# Patient Record
Sex: Female | Born: 1940
Health system: Southern US, Community
[De-identification: ages and names within clinical notes are randomized; demographics above are authoritative.]

## PROBLEM LIST (undated history)

## (undated) DIAGNOSIS — E041 Nontoxic single thyroid nodule: Secondary | ICD-10-CM

## (undated) DIAGNOSIS — E785 Hyperlipidemia, unspecified: Secondary | ICD-10-CM

## (undated) DIAGNOSIS — G5603 Carpal tunnel syndrome, bilateral upper limbs: Secondary | ICD-10-CM

## (undated) DIAGNOSIS — J449 Chronic obstructive pulmonary disease, unspecified: Secondary | ICD-10-CM

## (undated) DIAGNOSIS — Z720 Tobacco use: Secondary | ICD-10-CM

## (undated) DIAGNOSIS — D4959 Neoplasm of unspecified behavior of other genitourinary organ: Secondary | ICD-10-CM

## (undated) DIAGNOSIS — I10 Essential (primary) hypertension: Secondary | ICD-10-CM

## (undated) HISTORY — DX: Hyperlipidemia, unspecified: E78.5

## (undated) HISTORY — DX: Nontoxic single thyroid nodule: E04.1

## (undated) HISTORY — DX: Neoplasm of unspecified behavior of other genitourinary organ: D49.59

## (undated) HISTORY — DX: Essential (primary) hypertension: I10

## (undated) HISTORY — DX: Tobacco use: Z72.0

## (undated) HISTORY — PX: TONSILLECTOMY: SHX5217

## (undated) HISTORY — DX: Chronic obstructive pulmonary disease, unspecified: J44.9

---

## 2005-01-27 ENCOUNTER — Other Ambulatory Visit: Admission: RE | Admit: 2005-01-27 | Discharge: 2005-01-27 | Payer: Self-pay | Admitting: Family Medicine

## 2006-01-27 ENCOUNTER — Other Ambulatory Visit: Admission: RE | Admit: 2006-01-27 | Discharge: 2006-01-27 | Payer: Self-pay | Admitting: Family Medicine

## 2006-07-06 ENCOUNTER — Ambulatory Visit: Payer: Self-pay | Admitting: Family Medicine

## 2006-12-16 ENCOUNTER — Ambulatory Visit: Payer: Self-pay | Admitting: Family Medicine

## 2007-03-10 ENCOUNTER — Ambulatory Visit: Payer: Self-pay | Admitting: Family Medicine

## 2008-03-14 ENCOUNTER — Ambulatory Visit: Payer: Self-pay | Admitting: Family Medicine

## 2008-03-14 ENCOUNTER — Other Ambulatory Visit: Admission: RE | Admit: 2008-03-14 | Discharge: 2008-03-14 | Payer: Self-pay | Admitting: Family Medicine

## 2008-03-14 LAB — HM PAP SMEAR: HM Pap smear: NORMAL

## 2008-03-15 ENCOUNTER — Encounter: Admission: RE | Admit: 2008-03-15 | Discharge: 2008-03-15 | Payer: Self-pay | Admitting: Family Medicine

## 2009-03-18 ENCOUNTER — Ambulatory Visit: Payer: Self-pay | Admitting: Family Medicine

## 2009-03-19 ENCOUNTER — Encounter: Admission: RE | Admit: 2009-03-19 | Discharge: 2009-03-19 | Payer: Self-pay | Admitting: Family Medicine

## 2009-10-15 ENCOUNTER — Encounter: Admission: RE | Admit: 2009-10-15 | Discharge: 2009-10-15 | Payer: Self-pay | Admitting: Family Medicine

## 2009-10-15 ENCOUNTER — Ambulatory Visit: Payer: Self-pay | Admitting: Family Medicine

## 2009-10-18 ENCOUNTER — Encounter: Payer: Self-pay | Admitting: Pulmonary Disease

## 2009-10-18 ENCOUNTER — Ambulatory Visit: Payer: Self-pay | Admitting: Family Medicine

## 2009-10-30 ENCOUNTER — Ambulatory Visit: Payer: Self-pay | Admitting: Pulmonary Disease

## 2009-10-30 DIAGNOSIS — D751 Secondary polycythemia: Secondary | ICD-10-CM | POA: Insufficient documentation

## 2009-10-30 DIAGNOSIS — R0602 Shortness of breath: Secondary | ICD-10-CM | POA: Insufficient documentation

## 2009-10-30 DIAGNOSIS — F17200 Nicotine dependence, unspecified, uncomplicated: Secondary | ICD-10-CM | POA: Insufficient documentation

## 2009-10-31 ENCOUNTER — Encounter: Payer: Self-pay | Admitting: Pulmonary Disease

## 2009-11-04 ENCOUNTER — Ambulatory Visit: Payer: Self-pay | Admitting: Pulmonary Disease

## 2009-11-04 DIAGNOSIS — J449 Chronic obstructive pulmonary disease, unspecified: Secondary | ICD-10-CM

## 2009-11-04 HISTORY — DX: Chronic obstructive pulmonary disease, unspecified: J44.9

## 2009-11-07 ENCOUNTER — Encounter: Payer: Self-pay | Admitting: Pulmonary Disease

## 2009-11-15 ENCOUNTER — Ambulatory Visit: Payer: Self-pay | Admitting: Pulmonary Disease

## 2009-11-15 DIAGNOSIS — R141 Gas pain: Secondary | ICD-10-CM | POA: Insufficient documentation

## 2009-11-15 DIAGNOSIS — J449 Chronic obstructive pulmonary disease, unspecified: Secondary | ICD-10-CM | POA: Insufficient documentation

## 2009-11-15 DIAGNOSIS — R142 Eructation: Secondary | ICD-10-CM

## 2009-11-15 DIAGNOSIS — R143 Flatulence: Secondary | ICD-10-CM

## 2010-02-17 ENCOUNTER — Ambulatory Visit: Payer: Self-pay | Admitting: Physician Assistant

## 2010-02-19 ENCOUNTER — Encounter: Admission: RE | Admit: 2010-02-19 | Discharge: 2010-02-19 | Payer: Self-pay | Admitting: Family Medicine

## 2010-03-11 LAB — HM MAMMOGRAPHY: HM Mammogram: NORMAL

## 2010-03-19 ENCOUNTER — Ambulatory Visit: Payer: Self-pay | Admitting: Family Medicine

## 2010-07-28 ENCOUNTER — Ambulatory Visit: Payer: Self-pay | Admitting: Physician Assistant

## 2010-08-05 ENCOUNTER — Ambulatory Visit: Payer: Self-pay | Admitting: Family Medicine

## 2010-08-12 ENCOUNTER — Ambulatory Visit: Payer: Self-pay | Admitting: Family Medicine

## 2010-10-13 ENCOUNTER — Ambulatory Visit
Admission: RE | Admit: 2010-10-13 | Discharge: 2010-10-13 | Payer: Self-pay | Source: Home / Self Care | Attending: Family Medicine | Admitting: Family Medicine

## 2010-11-04 NOTE — Miscellaneous (Signed)
Summary: Pulmonary function test   Pulmonary Function Test Date: 11/04/2009 Height (in.): 65 Gender: Female  Pre-Spirometry FVC    Value: 2.68 L/min   Pred: 2.97 L/min     % Pred: 90 % FEV1    Value: 1.19 L     Pred: 2.13 L     % Pred: 56 % FEV1/FVC  Value: 44 %     Pred: 72 %     % Pred: . % FEF 25-75  Value: 0.39 L/min   Pred: 2.38 L/min     % Pred: 16 %  Post-Spirometry FVC    Value: 2.61 L/min   Pred: 2.97 L/min     % Pred: 88 % FEV1    Value: 1.11 L     Pred: 2.13 L     % Pred: 52 % FEV1/FVC  Value: 43 %     Pred: 72 %     % Pred: . % FEF 25-75  Value: 0.35 L/min   Pred: 2.38 L/min     % Pred: 15 %  Lung Volumes TLC    Value: 5.80 L   % Pred: 115 % RV    Value: 3.12 L   % Pred: 155 % DLCO    Value: 9.2 %   % Pred: 52 % DLCO/VA  Value: 2.41 %   % Pred: 68 %  Comments: Severe obstruction.  Airtrapping.  Moderate diffusion defect.  No bronchodilator response. Clinical Lists Changes  Observations: Added new observation of PFT COMMENTS: Severe obstruction.  Airtrapping.  Moderate diffusion defect.  No bronchodilator response. (11/04/2009 8:21) Added new observation of DLCO/VA%EXP: 68 % (11/04/2009 8:21) Added new observation of DLCO/VA: 2.41 % (11/04/2009 8:21) Added new observation of DLCO % EXPEC: 52 % (11/04/2009 8:21) Added new observation of DLCO: 9.2 % (11/04/2009 8:21) Added new observation of RV % EXPECT: 155 % (11/04/2009 8:21) Added new observation of RV: 3.12 L (11/04/2009 8:21) Added new observation of TLC % EXPECT: 115 % (11/04/2009 8:21) Added new observation of TLC: 5.80 L (11/04/2009 8:21) Added new observation of FEF2575%EXPS: 15 % (11/04/2009 8:21) Added new observation of PSTFEF25/75P: 2.38  (11/04/2009 8:21) Added new observation of PSTFEF25/75%: 0.35 L/min (11/04/2009 8:21) Added new observation of PSTFEV1/FCV%: . % (11/04/2009 8:21) Added new observation of FEV1FVCPRDPS: 72 % (11/04/2009 8:21) Added new observation of PSTFEV1/FVC: 43 % (11/04/2009  8:21) Added new observation of POSTFEV1%PRD: 52 % (11/04/2009 8:21) Added new observation of FEV1PRDPST: 2.13 L (11/04/2009 8:21) Added new observation of POST FEV1: 1.11 L/min (11/04/2009 8:21) Added new observation of POST FVC%EXP: 88 % (11/04/2009 8:21) Added new observation of FVCPRDPST: 2.97 L/min (11/04/2009 8:21) Added new observation of POST FVC: 2.61 L (11/04/2009 8:21) Added new observation of FEF % EXPEC: 16 % (11/04/2009 8:21) Added new observation of FEF25-75%PRE: 2.38 L/min (11/04/2009 8:21) Added new observation of FEF 25-75%: 0.39 L/min (11/04/2009 8:21) Added new observation of FEV1/FVC%EXP: . % (11/04/2009 8:21) Added new observation of FEV1/FVC PRE: 72 % (11/04/2009 8:21) Added new observation of FEV1/FVC: 44 % (11/04/2009 8:21) Added new observation of FEV1 % EXP: 56 % (11/04/2009 8:21) Added new observation of FEV1 PREDICT: 2.13 L (11/04/2009 8:21) Added new observation of FEV1: 1.19 L (11/04/2009 8:21) Added new observation of FVC % EXPECT: 90 % (11/04/2009 8:21) Added new observation of FVC PREDICT: 2.97 L (11/04/2009 8:21) Added new observation of FVC: 2.68 L (11/04/2009 8:21) Added new observation of PFT HEIGHT: 65  (11/04/2009 8:21) Added new observation of PFT DATE: 11/04/2009  (  11/04/2009 8:21) 

## 2010-11-04 NOTE — Assessment & Plan Note (Signed)
Summary: 2-3 weeks/apc   Primary Provider/Referring Provider:  Dr. Sharlot Gowda  CC:  Dyspnea. Discuss ONO results and PFT's..  History of Present Illness: 70 yo female with COPD.  She has no change in her symptoms.  Her PFT showed moderate to severe COPD with emphysema.  Her ONO showed only brief, minimal oxygen desaturation.  This was not enough to warrant supplemental oxygen.  Current Medications (verified): 1)  Aspirin 81 Mg Tabs (Aspirin) .Marland Kitchen.. 1 By Mouth Daily 2)  Lisinopril-Hydrochlorothiazide 20-25 Mg Tabs (Lisinopril-Hydrochlorothiazide) .Marland Kitchen.. 1 By Mouth Daily 3)  Simvastatin 20 Mg Tabs (Simvastatin) .Marland Kitchen.. 1 By Mouth Daily 4)  Ester-C 500-60 Mg Tabs (Vitamin Mixture) .Marland Kitchen.. 1 By Mouth Daily 5)  Omega-3 1000 Mg Caps (Omega-3 Fatty Acids) .Marland Kitchen.. 1 By Mouth Two Times A Day 6)  Odorless Garlic 500 Mg Tabs (Garlic) .Marland Kitchen.. 1 By Mouth Daily 7)  Multivitamins  Tabs (Multiple Vitamin) .Marland Kitchen.. 1 By Mouth Daily 8)  Coq10 200 Mg Caps (Coenzyme Q10) .Marland Kitchen.. 1 By Mouth Daily 9)  B-100  Tabs (Vitamins-Lipotropics) .Marland Kitchen.. 1 By Mouth Daily 10)  Green Tea 315 Mg Caps (Green Tea (Camillia Sinensis)) .Marland Kitchen.. 1 By Mouth Daily 11)  Calcium 600-200 Mg-Unit Tabs (Calcium-Vitamin D) .... 2 By Mouth Daily 12)  Calcium/mag/zinc 1000/400/25 .Marland Kitchen.. 1 By Mouth Daily 13)  Proair Hfa 108 (90 Base) Mcg/act Aers (Albuterol Sulfate) .... Two Puffs Up To Four Times Per Day As Needed  Allergies (verified): 1)  ! Niacin 2)  ! Codeine  Past History:  Past Medical History: Thyroid nodule Hypertension Hyperlipidemia Tobacco abuse GOLD 3 COPD      - 11/04/09 PFT FEV1 1.19(56%), FVC 2.68(90%), FEV1% 44, TLC 5.80(115%), DLCO 52%  Past Surgical History: Reviewed history from 10/30/2009 and no changes required. Tonsillectomy  Vital Signs:  Patient profile:   70 year old female Height:      65.5 inches (166.37 cm) Weight:      131 pounds (59.55 kg) BMI:     21.55 O2 Sat:      95 % on Room air Temp:     97.6 degrees F  (36.44 degrees C) oral Pulse rate:   70 / minute BP sitting:   132 / 72  (left arm) Cuff size:   regular  Vitals Entered By: Michel Bickers CMA (November 15, 2009 1:50 PM)  O2 Sat at Rest %:  95 O2 Flow:  Room air  Physical Exam  Nose:  no deformity, discharge, inflammation, or lesions Mouth:  wears dentures, no oral lesions Neck:  no JVD.   Lungs:  diminished breath sounds, no wheezing or rales Heart:  regular rate and rhythm, S1, S2 without murmurs, rubs, gallops, or clicks Extremities:  no clubbing, cyanosis, edema, or deformity noted Cervical Nodes:  no significant adenopathy   Impression & Recommendations:  Problem # 1:  COPD (ICD-496) Her PFT shows moderate to severe COPD with emphysema.  I will start her on spiriva, and continue as needed proair.  Will refer her to pulmonary rehab. If she can stop smoking would then check A1AT level.  She will check to she if/when she got her pneumonia shot.  Problem # 2:  TOBACCO ABUSE (ICD-305.1) She is not ready to stop smoking yet.  Again explained how smoking is impacting her health.  Problem # 3:  POLYCYTHEMIA (ICD-289.0) Her recent overnight oximetry was unremarkable.  Problem # 4:  ABDOMINAL BLOATING (ICD-787.3) Advised for her to follow up with primary care.  Medications Added to Medication List  This Visit: 1)  Spiriva Handihaler 18 Mcg Caps (Tiotropium bromide monohydrate) .... One puff once daily  Complete Medication List: 1)  Aspirin 81 Mg Tabs (Aspirin) .Marland Kitchen.. 1 by mouth daily 2)  Lisinopril-hydrochlorothiazide 20-25 Mg Tabs (Lisinopril-hydrochlorothiazide) .Marland Kitchen.. 1 by mouth daily 3)  Simvastatin 20 Mg Tabs (Simvastatin) .Marland Kitchen.. 1 by mouth daily 4)  Ester-c 500-60 Mg Tabs (Vitamin mixture) .Marland Kitchen.. 1 by mouth daily 5)  Omega-3 1000 Mg Caps (Omega-3 fatty acids) .Marland Kitchen.. 1 by mouth two times a day 6)  Odorless Garlic 500 Mg Tabs (Garlic) .Marland Kitchen.. 1 by mouth daily 7)  Multivitamins Tabs (Multiple vitamin) .Marland Kitchen.. 1 by mouth daily 8)  Coq10 200  Mg Caps (Coenzyme q10) .Marland Kitchen.. 1 by mouth daily 9)  B-100 Tabs (Vitamins-lipotropics) .Marland Kitchen.. 1 by mouth daily 10)  Green Tea 315 Mg Caps (Green tea (camillia sinensis)) .Marland Kitchen.. 1 by mouth daily 11)  Calcium 600-200 Mg-unit Tabs (Calcium-vitamin d) .... 2 by mouth daily 12)  Calcium/mag/zinc 1000/400/25  .Marland Kitchen.. 1 by mouth daily 13)  Proair Hfa 108 (90 Base) Mcg/act Aers (Albuterol sulfate) .... Two puffs up to four times per day as needed 14)  Spiriva Handihaler 18 Mcg Caps (Tiotropium bromide monohydrate) .... One puff once daily  Other Orders: Est. Patient Level III (04540) Rehabilitation Referral (Rehab)  Patient Instructions: 1)  Spiriva one puff once daily  2)  Proair two puffs up to four times per day as needed for cough, wheeze, congestion, or shortness of breath 3)  Will refer to pulmonary rehab 4)  Follow up in 2 to 3 months Prescriptions: PROAIR HFA 108 (90 BASE) MCG/ACT AERS (ALBUTEROL SULFATE) two puffs up to four times per day as needed  #1 x 6   Entered and Authorized by:   Coralyn Helling MD   Signed by:   Coralyn Helling MD on 11/15/2009   Method used:   Electronically to        [facility] # [id number]* (retail)       [address] [place], [place]  [address]       Ph: [phone]       Fax: [phone]   RxID:   [id number] SPIRIVA HANDIHALER 18 MCG CAPS (TIOTROPIUM BROMIDE MONOHYDRATE) one puff once daily  #30 x 6   Entered and Authorized by:   Coralyn Helling MD   Signed by:   Coralyn Helling MD on 11/15/2009   Method used:   Electronically to        [facility] # [phone]* (retail)       [address] [place], [place]  [address]       Ph: [phone]       Fax: [phone]   RxID:   [id number]    Immunization History:  Influenza Immunization History:    Influenza:  historical (06/19/2009)

## 2010-11-04 NOTE — Miscellaneous (Signed)
Summary: Overnight oximetry  Clinical Lists Changes Test time 6hrs 4 min.  Average SpO2 93%.  Low SpO2 87%.  Spent 20 sec with SpO2 < 88%.  Will have my nurse call to inform patient that overnight oxygen test looked okay, and will discuss further at next ROV.  Appended Document: Overnight oximetry LMTCB.  Appended Document: Overnight oximetry The patient is aware ONO looks good and Dr. Craige Cotta will discuss further at her OV on 11/15/09 @ 1:45pm.

## 2010-11-04 NOTE — Assessment & Plan Note (Signed)
Summary: wheezing, r/o copd/apc   Primary Provider/Referring Provider:  Dr. Sharlot Gowda  CC:  Pulmonary consult for dyspnea and wheezing.Marland Kitchen  History of Present Illness: 70 yo female for evaluation of dyspnea.  She had an episode 2 weeks ago in which should could not catch her breath.  She was making her bed, and then suddenly felt like she couldn't get air into her lungs.  She had some mild wheeze at that time.  She was given an inhaler, but has not used it.  She has never had anything like this happen before.  Otherwise she has not felt like her breathing has limited her activity level in anyway.  In fact she has a regular exercise routine, and has been able to do this without difficulty.  She currently denies cough, wheeze, sputum, hemoptysis, chest pain, palpitations, lymph node swelling, leg swelling, skin rashes, or joint swelling.  There is no history of allergies, asthma, pneumonia, TB, or thrombo-embolic disease.  She has not had fever, sweats, or weight loss.  She did have a gassy feeling when she had trouble with her breathing, and took milk of magnesia.  She is originally from [place], [place].  She has lived in [place] for the past 13 years.  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  There is no recent travel history, animal exposures, or sick exposures.  She continues to smoke cigarettes.  She states that she likes smoking, and is no interested in quitting.  She had recent lab work which showed an elevated hemoglobin.  She had a recent chest xray which showed hyperinflation, and bronchitic changes.   CXR  Procedure date:  10/15/2009  Findings:       Findings: Slight hyperinflation with generalized prominence   bronchopulmonary markings of slight diffuse bronchitis noted.   Lungs are otherwise clear without pneumonia.  Heart size is normal.   Thoracic aortic arch is calcified.  Mediastinum, hila, pleura and   osseous structures are  unremarkable.    IMPRESSION:    1.  Slight hyperinflation of COPD with slight diffuse bronchitis   without pneumonia.   2.  Otherwise, negative.   Preventive Screening-Counseling & Management  Alcohol-Tobacco     Alcohol drinks/day: <1     Smoking Status: current     Packs/Day: 0.5     Year Started: 48 years  Current Medications (verified): 1)  Aspirin 81 Mg Tabs (Aspirin) .Marland Kitchen.. 1 By Mouth Daily 2)  Lisinopril-Hydrochlorothiazide 20-25 Mg Tabs (Lisinopril-Hydrochlorothiazide) .Marland Kitchen.. 1 By Mouth Daily 3)  Simvastatin 20 Mg Tabs (Simvastatin) .Marland Kitchen.. 1 By Mouth Daily 4)  Ester-C 500-60 Mg Tabs (Vitamin Mixture) .Marland Kitchen.. 1 By Mouth Daily 5)  Omega-3 1000 Mg Caps (Omega-3 Fatty Acids) .Marland Kitchen.. 1 By Mouth Two Times A Day 6)  Odorless Garlic 500 Mg Tabs (Garlic) .Marland Kitchen.. 1 By Mouth Daily 7)  Multivitamins  Tabs (Multiple Vitamin) .Marland Kitchen.. 1 By Mouth Daily 8)  Coq10 200 Mg Caps (Coenzyme Q10) .Marland Kitchen.. 1 By Mouth Daily 9)  B-100  Tabs (Vitamins-Lipotropics) .Marland Kitchen.. 1 By Mouth Daily 10)  Green Tea 315 Mg Caps (Green Tea (Camillia Sinensis)) .Marland Kitchen.. 1 By Mouth Daily 11)  Calcium 600-200 Mg-Unit Tabs (Calcium-Vitamin D) .... 2 By Mouth Daily 12)  Calcium/mag/zinc 1000/400/25 .Marland Kitchen.. 1 By Mouth Daily  Allergies (verified): 1)  ! Niacin 2)  ! Codeine  Past History:  Past Medical History: Thyroid nodule Hypertension Hyperlipidemia Tobacco abuse  Past Surgical History: Tonsillectomy  Family History: Heart disease---mother Family History  Asthma---father Family History Emphysema ---father  Social History: Patient is a current smoker.  Married Retired Engineer, building services Status:  current Packs/Day:  0.5 Alcohol drinks/day:  <1  Review of Systems       The patient complains of shortness of breath with activity, non-productive cough, acid heartburn, and loss of appetite.  The patient denies shortness of breath at rest, productive cough, coughing up blood, chest pain, irregular heartbeats, indigestion, weight  change, abdominal pain, difficulty swallowing, sore throat, tooth/dental problems, headaches, nasal congestion/difficulty breathing through nose, sneezing, itching, ear ache, anxiety, depression, hand/feet swelling, joint stiffness or pain, rash, change in color of mucus, and fever.    Vital Signs:  Patient profile:   70 year old female Height:      65.5 inches (166.37 cm) Weight:      128.38 pounds (58.35 kg) BMI:     21.11 O2 Sat:      92 % on Room air Temp:     98.1 degrees F (36.72 degrees C) oral Pulse rate:   81 / minute BP sitting:   124 / 76  (right arm) Cuff size:   regular  Vitals Entered By: Michel Bickers CMA (October 30, 2009 2:48 PM)  O2 Sat at Rest %:  92 O2 Flow:  Room air  Serial Vital Signs/Assessments:  Comments: 3:13 PM Ambulatory Pulse Oximetry  Resting; HR__91___    02 Sat_97____  Lap1 (185 feet)   HR_87____   02 Sat__97___ Lap2 (185 feet)   HR__87___   02 Sat__95___    Lap3 (185 feet)   HR___90__   02 Sat__95___  x___Test Completed without Difficulty ___Test Stopped due to:  By: Michel Bickers CMA   CC: Pulmonary consult for dyspnea and wheezing.   Physical Exam  General:  thin.   Eyes:  PERRLA and EOMI, wears glasses Nose:  no deformity, discharge, inflammation, or lesions Mouth:  wears dentures, no oral lesions Neck:  no JVD.   Chest Wall:  barrel chest.   Lungs:  diminished breath sounds, no wheezing or rales Heart:  regular rate and rhythm, S1, S2 without murmurs, rubs, gallops, or clicks Abdomen:  bowel sounds positive; abdomen soft and non-tender without masses, or organomegaly Msk:  no deformity or scoliosis noted with normal posture Pulses:  pulses normal Extremities:  no clubbing, cyanosis, edema, or deformity noted Neurologic:  CN II-XII grossly intact with normal reflexes, coordination, muscle strength and tone Cervical Nodes:  no significant adenopathy Psych:  alert and cooperative; normal mood and affect; normal attention span and  concentration   Pulmonary Function Test Date: 10/30/2009 3:04 PM Gender: Female  Pre-Spirometry FVC    Value: 1.38 L/min   % Pred: 43.70 % FEV1    Value: 0.77 L     Pred: 2.39 L     % Pred: 32.20 % FEV1/FVC  Value: 55.81 %     % Pred: 73.30 %  Impression & Recommendations:  Problem # 1:  DYSPNEA (ICD-786.05) She has an extensive history of smoking.  Her radiographic findings are suggestive of emphysema.  Her spirometric findings are suggestive of severe obstruction, but she had some difficulty performing the test.  I have advised her to try using her proair as needed.  I will arrange for full pulmonary function testing to further assess whether she has COPD.  Problem # 2:  POLYCYTHEMIA (ICD-289.0) She had an elevated hemoglobin.  Her oxygen level at rest and exertion was okay.  I will arrange for overnight oximetry to determine if  she is having nocturnal oxygen desaturation.  Problem # 3:  TOBACCO ABUSE (ICD-305.1) Advised her about the importance of smoking cessation.  She states that she likes smoking too much, and is not ready to consider quiting yet.  Medications Added to Medication List This Visit: 1)  Simvastatin 20 Mg Tabs (Simvastatin) .Marland Kitchen.. 1 by mouth daily 2)  Proair Hfa 108 (90 Base) Mcg/act Aers (Albuterol sulfate) .... Two puffs up to four times per day as needed  Complete Medication List: 1)  Aspirin 81 Mg Tabs (Aspirin) .Marland Kitchen.. 1 by mouth daily 2)  Lisinopril-hydrochlorothiazide 20-25 Mg Tabs (Lisinopril-hydrochlorothiazide) .Marland Kitchen.. 1 by mouth daily 3)  Simvastatin 20 Mg Tabs (Simvastatin) .Marland Kitchen.. 1 by mouth daily 4)  Ester-c 500-60 Mg Tabs (Vitamin mixture) .Marland Kitchen.. 1 by mouth daily 5)  Omega-3 1000 Mg Caps (Omega-3 fatty acids) .Marland Kitchen.. 1 by mouth two times a day 6)  Odorless Garlic 500 Mg Tabs (Garlic) .Marland Kitchen.. 1 by mouth daily 7)  Multivitamins Tabs (Multiple vitamin) .Marland Kitchen.. 1 by mouth daily 8)  Coq10 200 Mg Caps (Coenzyme q10) .Marland Kitchen.. 1 by mouth daily 9)  B-100 Tabs  (Vitamins-lipotropics) .Marland Kitchen.. 1 by mouth daily 10)  Green Tea 315 Mg Caps (Green tea (camillia sinensis)) .Marland Kitchen.. 1 by mouth daily 11)  Calcium 600-200 Mg-unit Tabs (Calcium-vitamin d) .... 2 by mouth daily 12)  Calcium/mag/zinc 1000/400/25  .Marland Kitchen.. 1 by mouth daily 13)  Proair Hfa 108 (90 Base) Mcg/act Aers (Albuterol sulfate) .... Two puffs up to four times per day as needed  Other Orders: Consultation Level IV (16109) Spirometry w/Graph (94010) Tobacco use cessation intermediate 3-10 minutes (60454) DME Referral (DME) Full Pulmonary Function Test (PFT)  Patient Instructions: 1)  Will arrange for breathing test (PFT) 2)  Will arrange for overnight oxygen test 3)  Follow up in 2 to 3 weeks   CardioPerfect Spirometry  ID: [id number] Patient: Christine Keller, Christine Keller DOB: 01-20-1941 Age: 70 Years Old Sex: Female Race: White Height: 65.5 Weight: 128.38 PPD: 0.5 Status: Confirmed Recorded: 10/30/2009 3:04 PM  Parameter  Measured Predicted %Predicted FVC     1.38        3.15        43.70 FEV1     0.77        2.39        32.20 FEV1%   55.81        76.15        73.30 PEF    1.98        5.87        33.70   Interpretation: Pre: FVC= 1.38L FEV1= 0.77L FEV1%= 55.8% 0.77/1.38 FEV1/FVC (10/30/2009 3:07:56 PM), Very severe obstruction.

## 2010-11-04 NOTE — Procedures (Signed)
Summary: [facility]   Imported By: [facility] 11/15/2009 11:01:26  _____________________________________________________________________  External Attachment:    Type:   Image     Comment:   External Document

## 2010-11-04 NOTE — Miscellaneous (Signed)
Summary: Orders Update pft charges  Clinical Lists Changes  Orders: Added new Service order of Carbon Monoxide diffusing w/capacity (94720) - Signed Added new Service order of Lung Volumes (94240) - Signed Added new Service order of Spirometry (Pre & Post) (94060) - Signed 

## 2010-11-07 ENCOUNTER — Ambulatory Visit (INDEPENDENT_AMBULATORY_CARE_PROVIDER_SITE_OTHER): Payer: MEDICARE | Admitting: Family Medicine

## 2010-11-07 DIAGNOSIS — H6690 Otitis media, unspecified, unspecified ear: Secondary | ICD-10-CM

## 2010-11-07 DIAGNOSIS — J209 Acute bronchitis, unspecified: Secondary | ICD-10-CM

## 2010-12-15 ENCOUNTER — Encounter: Payer: Self-pay | Admitting: Internal Medicine

## 2010-12-22 ENCOUNTER — Ambulatory Visit (INDEPENDENT_AMBULATORY_CARE_PROVIDER_SITE_OTHER): Payer: MEDICARE | Admitting: Internal Medicine

## 2010-12-22 DIAGNOSIS — R143 Flatulence: Secondary | ICD-10-CM

## 2010-12-22 DIAGNOSIS — I10 Essential (primary) hypertension: Secondary | ICD-10-CM

## 2010-12-22 DIAGNOSIS — G5600 Carpal tunnel syndrome, unspecified upper limb: Secondary | ICD-10-CM | POA: Insufficient documentation

## 2010-12-22 DIAGNOSIS — R209 Unspecified disturbances of skin sensation: Secondary | ICD-10-CM

## 2010-12-22 DIAGNOSIS — R141 Gas pain: Secondary | ICD-10-CM

## 2011-02-05 ENCOUNTER — Other Ambulatory Visit: Payer: Self-pay | Admitting: Internal Medicine

## 2011-02-06 LAB — BASIC METABOLIC PANEL
BUN: 18 mg/dL (ref 6–23)
CO2: 29 mEq/L (ref 19–32)
Calcium: 9.7 mg/dL (ref 8.4–10.5)
Chloride: 105 mEq/L (ref 96–112)
Creat: 0.89 mg/dL (ref 0.40–1.20)
Glucose, Bld: 103 mg/dL — ABNORMAL HIGH (ref 70–99)
Potassium: 3.7 mEq/L (ref 3.5–5.3)
Sodium: 142 mEq/L (ref 135–145)

## 2011-02-06 LAB — HEPATIC FUNCTION PANEL
ALT: 26 U/L (ref 0–35)
AST: 27 U/L (ref 0–37)
Albumin: 4.5 g/dL (ref 3.5–5.2)
Alkaline Phosphatase: 60 U/L (ref 39–117)
Bilirubin, Direct: 0.1 mg/dL (ref 0.0–0.3)
Indirect Bilirubin: 0.4 mg/dL (ref 0.0–0.9)
Total Bilirubin: 0.5 mg/dL (ref 0.3–1.2)
Total Protein: 6.6 g/dL (ref 6.0–8.3)

## 2011-02-06 LAB — VITAMIN D 25 HYDROXY (VIT D DEFICIENCY, FRACTURES): Vit D, 25-Hydroxy: 42 ng/mL (ref 30–89)

## 2011-02-06 LAB — LIPID PANEL
Cholesterol: 168 mg/dL (ref 0–200)
HDL: 44 mg/dL (ref >39)
LDL Cholesterol: 97 mg/dL (ref 0–99)
Total CHOL/HDL Ratio: 3.8 Ratio
Triglycerides: 136 mg/dL (ref <150)
VLDL: 27 mg/dL (ref 0–40)

## 2011-02-06 LAB — TSH: TSH: 1.295 u[IU]/mL (ref 0.350–4.500)

## 2011-02-10 ENCOUNTER — Encounter: Payer: Self-pay | Admitting: Internal Medicine

## 2011-02-10 ENCOUNTER — Ambulatory Visit (INDEPENDENT_AMBULATORY_CARE_PROVIDER_SITE_OTHER): Payer: MEDICARE | Admitting: Internal Medicine

## 2011-02-10 VITALS — BP 118/72 | HR 71 | Temp 97.7°F | Resp 16 | Ht 65.5 in | Wt 125.0 lb

## 2011-02-10 DIAGNOSIS — I10 Essential (primary) hypertension: Secondary | ICD-10-CM

## 2011-02-10 DIAGNOSIS — Z0000 Encounter for general adult medical examination without abnormal findings: Secondary | ICD-10-CM

## 2011-02-10 MED ORDER — VALSARTAN-HYDROCHLOROTHIAZIDE 160-25 MG PO TABS: 1.0000 | ORAL_TABLET | Freq: Every day | ORAL | Status: DC

## 2011-02-10 MED ORDER — SIMVASTATIN 20 MG PO TABS: 20.0000 mg | ORAL_TABLET | Freq: Every day | ORAL | Status: DC

## 2011-02-11 ENCOUNTER — Ambulatory Visit (INDEPENDENT_AMBULATORY_CARE_PROVIDER_SITE_OTHER)
Admission: RE | Admit: 2011-02-11 | Discharge: 2011-02-11 | Disposition: A | Discharge status: Home / Self Care | Payer: MEDICARE | Source: Ambulatory Visit

## 2011-02-11 DIAGNOSIS — Z13820 Encounter for screening for osteoporosis: Secondary | ICD-10-CM

## 2011-02-11 DIAGNOSIS — Z0000 Encounter for general adult medical examination without abnormal findings: Secondary | ICD-10-CM

## 2011-02-11 LAB — HM DEXA SCAN

## 2011-02-12 ENCOUNTER — Telehealth: Payer: Self-pay | Admitting: *Deleted

## 2011-02-12 DIAGNOSIS — I10 Essential (primary) hypertension: Secondary | ICD-10-CM

## 2011-02-12 NOTE — Telephone Encounter (Signed)
Please change to benicar 20/12.5 #90  One tab po qd  Refill x 1

## 2011-02-12 NOTE — Telephone Encounter (Signed)
Received message from [facility] (Ref # [id number]) wanting to verify change in Simvastatin from 40mg  to 20mg . Advised Chor, that Simvastatin has been 20mg  since 10/2010 and that is the correct dose.  They also wanted [place] to be aware that Diovan HCT 160-25 will be a higher copay for pt (90 day supply will be $125).  Some Tier 2 alternatives are Aldactazide, Benicar HCT and Hyzaar HCT. Would any of these be appropriate or should they fill Rx as written?

## 2011-02-13 ENCOUNTER — Telehealth: Payer: Self-pay | Admitting: Internal Medicine

## 2011-02-13 MED ORDER — OLMESARTAN MEDOXOMIL-HCTZ 20-12.5 MG PO TABS: 1.0000 | ORAL_TABLET | Freq: Every day | ORAL | Status: DC

## 2011-02-13 MED ORDER — VALSARTAN-HYDROCHLOROTHIAZIDE 160-25 MG PO TABS: 1.0000 | ORAL_TABLET | Freq: Every day | ORAL | Status: DC

## 2011-02-13 NOTE — Telephone Encounter (Signed)
Call placed to [facility] spoke with pharmacist Boykin Reaper he was advised that patient had declined the formulary change and will pay out of pocket for the prescribed medication. He has verbalized understanding and has updated patients medication to reflect the change for the Diovan instead of the Benicar.  Call was returned to patient at [phone], no answer. A detailed voice message was left informing patient that Rx has been changed back to Diovan.

## 2011-02-13 NOTE — Telephone Encounter (Signed)
She wants to stay on the diovan hct   She does not want to change medicine.  She is willing to pay the $125 for the diovan.  Please send the script for diovan to [facility].

## 2011-02-13 NOTE — Telephone Encounter (Signed)
Call placed to patient at [phone], no answer. A detailed voice message was left informing patient of medication change. If any questions message was left for patient to call back. Rx for Benicar 20-12.5 sent to pharmacy

## 2011-03-09 ENCOUNTER — Encounter: Payer: Self-pay | Admitting: Internal Medicine

## 2011-03-17 ENCOUNTER — Ambulatory Visit (HOSPITAL_BASED_OUTPATIENT_CLINIC_OR_DEPARTMENT_OTHER)
Admission: RE | Admit: 2011-03-17 | Discharge: 2011-03-17 | Disposition: A | Discharge status: Home / Self Care | Payer: Medicare Other | Source: Ambulatory Visit | Attending: Internal Medicine | Admitting: Internal Medicine

## 2011-03-17 DIAGNOSIS — Z1231 Encounter for screening mammogram for malignant neoplasm of breast: Secondary | ICD-10-CM | POA: Insufficient documentation

## 2011-03-17 LAB — HM MAMMOGRAPHY: HM Mammogram: NORMAL

## 2011-04-15 ENCOUNTER — Ambulatory Visit (INDEPENDENT_AMBULATORY_CARE_PROVIDER_SITE_OTHER): Payer: Medicare Other | Admitting: Family

## 2011-04-15 ENCOUNTER — Encounter: Payer: Self-pay | Admitting: Family

## 2011-04-15 ENCOUNTER — Other Ambulatory Visit (HOSPITAL_COMMUNITY)
Admission: RE | Admit: 2011-04-15 | Discharge: 2011-04-15 | Disposition: A | Discharge status: Home / Self Care | Payer: Medicare Other | Source: Ambulatory Visit | Attending: Family | Admitting: Family

## 2011-04-15 ENCOUNTER — Encounter: Payer: Self-pay | Admitting: *Deleted

## 2011-04-15 ENCOUNTER — Encounter: Payer: MEDICARE | Admitting: Internal Medicine

## 2011-04-15 ENCOUNTER — Ambulatory Visit (HOSPITAL_BASED_OUTPATIENT_CLINIC_OR_DEPARTMENT_OTHER)
Admission: RE | Admit: 2011-04-15 | Discharge: 2011-04-15 | Disposition: A | Discharge status: Home / Self Care | Payer: Medicare Other | Source: Ambulatory Visit | Attending: Family | Admitting: Family

## 2011-04-15 VITALS — BP 146/80 | HR 84 | Temp 97.7°F | Resp 16 | Ht 64.5 in | Wt 126.1 lb

## 2011-04-15 DIAGNOSIS — R209 Unspecified disturbances of skin sensation: Secondary | ICD-10-CM

## 2011-04-15 DIAGNOSIS — R202 Paresthesia of skin: Secondary | ICD-10-CM

## 2011-04-15 DIAGNOSIS — R7309 Other abnormal glucose: Secondary | ICD-10-CM

## 2011-04-15 DIAGNOSIS — M47812 Spondylosis without myelopathy or radiculopathy, cervical region: Secondary | ICD-10-CM | POA: Insufficient documentation

## 2011-04-15 DIAGNOSIS — F17200 Nicotine dependence, unspecified, uncomplicated: Secondary | ICD-10-CM

## 2011-04-15 DIAGNOSIS — M25519 Pain in unspecified shoulder: Secondary | ICD-10-CM | POA: Insufficient documentation

## 2011-04-15 DIAGNOSIS — M5030 Other cervical disc degeneration, unspecified cervical region: Secondary | ICD-10-CM | POA: Insufficient documentation

## 2011-04-15 DIAGNOSIS — Z0000 Encounter for general adult medical examination without abnormal findings: Secondary | ICD-10-CM

## 2011-04-15 DIAGNOSIS — R739 Hyperglycemia, unspecified: Secondary | ICD-10-CM | POA: Insufficient documentation

## 2011-04-15 DIAGNOSIS — M899 Disorder of bone, unspecified: Secondary | ICD-10-CM | POA: Insufficient documentation

## 2011-04-15 DIAGNOSIS — Z01419 Encounter for gynecological examination (general) (routine) without abnormal findings: Secondary | ICD-10-CM

## 2011-04-15 DIAGNOSIS — M79609 Pain in unspecified limb: Secondary | ICD-10-CM | POA: Insufficient documentation

## 2011-04-15 DIAGNOSIS — Z124 Encounter for screening for malignant neoplasm of cervix: Secondary | ICD-10-CM | POA: Insufficient documentation

## 2011-04-15 DIAGNOSIS — M949 Disorder of cartilage, unspecified: Secondary | ICD-10-CM | POA: Insufficient documentation

## 2011-04-15 DIAGNOSIS — I10 Essential (primary) hypertension: Secondary | ICD-10-CM

## 2011-04-15 DIAGNOSIS — M542 Cervicalgia: Secondary | ICD-10-CM | POA: Insufficient documentation

## 2011-04-15 DIAGNOSIS — M5020 Other cervical disc displacement, unspecified cervical region: Secondary | ICD-10-CM | POA: Insufficient documentation

## 2011-04-15 LAB — HEMOGLOBIN A1C
Hgb A1c MFr Bld: 6 % — ABNORMAL HIGH (ref <5.7)
Mean Plasma Glucose: 126 mg/dL — ABNORMAL HIGH (ref <117)

## 2011-04-15 MED ORDER — VALSARTAN-HYDROCHLOROTHIAZIDE 160-25 MG PO TABS: 1.0000 | ORAL_TABLET | Freq: Every day | ORAL | Status: DC

## 2011-04-15 MED ORDER — TIOTROPIUM BROMIDE MONOHYDRATE 18 MCG IN CAPS: 18.0000 ug | ORAL_CAPSULE | Freq: Every day | RESPIRATORY_TRACT | Status: DC

## 2011-04-15 NOTE — Progress Notes (Signed)
Addended by: Mervin Kung A on: 04/15/2011 12:23 PM   Modules accepted: Orders

## 2011-04-15 NOTE — Assessment & Plan Note (Signed)
Fasting glucose ws 103, will check A1C.

## 2011-04-15 NOTE — Assessment & Plan Note (Signed)
BP is up today. She reports "white coat" HTN.  She will continue to check her BP at home and contact us if BP >140/90

## 2011-04-15 NOTE — Patient Instructions (Signed)
Please complete your lab work on the first floor today. Follow up in 3 months so that we can repeat your blood pressure. We will mail you the results of your pap smear.

## 2011-04-15 NOTE — Assessment & Plan Note (Signed)
Rule out cervical disc disease, start with x-ray cspine. If abnormal consider MRI.

## 2011-04-15 NOTE — Assessment & Plan Note (Signed)
Patient commended on her healthy diet and exercise. Immunizations up to date. Refuses colo.  Agreeable to IFOB.  Pap performed today.

## 2011-04-15 NOTE — Assessment & Plan Note (Signed)
Pt was counseled on smoking cessation x 5 minutes.  She is not motivated to quit at this time.

## 2011-04-15 NOTE — Progress Notes (Signed)
Subjective:    Patient ID: Christine Keller, female    DOB: [birth year], 70 y.o.   MRN: [id number]  HPI  Prevenative Care-  Up to date on dexa scan, never has had colo, up to date on mammogram, pneumovax, zostavax and tetanus.  She exercises 3x a week at the fitness center, walks regularly.  Diet is healthy- lots of veggies, some fruit- diet is low fat.    Tobacco abuse- down to <1 PPD.  Not motivated to quit.    Hand paresthesias- no improvement with the braces for CTS without improvement. She notes some pain radiating down her arms as well.  Denies hand weakness.   Review of Systems  Constitutional: Negative for fever.  HENT: Negative for hearing loss and congestion.   Eyes: Negative for visual disturbance.  Respiratory: Negative for choking and chest tightness.   Cardiovascular: Negative for chest pain.  Gastrointestinal: Negative for vomiting, abdominal pain, diarrhea, constipation and blood in stool.  Genitourinary: Negative for dysuria, urgency and hematuria.  Musculoskeletal: Negative for arthralgias.       Bilateral hand numbness- see HPI  Neurological: Negative for headaches.  Hematological: Negative for adenopathy. Does not bruise/bleed easily.  Psychiatric/Behavioral:       Denies depression or anxiety.     Past Medical History  Diagnosis Date  . Hyperlipidemia   . Hypertension   . Thyroid nodule   . Tobacco abuse   . COPD (chronic obstructive pulmonary disease) 1.31.11    PFT FEV1 1.19(56%), FVC 2.68(90%), FEV1% 44, TLC 5.80(115%), DLCO 52%    History   Social History  . Marital Status: Married    Spouse Name: N/A    Number of Children: N/A  . Years of Education: N/A   Occupational History  . Retired     Diplomatic Services operational officer   Social History Main Topics  . Smoking status: Current Everyday Smoker    Types: Cigarettes  . Smokeless tobacco: Not on file   Comment: Less than 1 ppd.  . Alcohol Use: Not on file  . Drug Use: Not on file  . Sexually Active: Not on file    Other Topics Concern  . Not on file   Social History Narrative   Regular exercise:  3 x weeklyCaffeine Use:  occasional    Past Surgical History  Procedure Date  . Tonsillectomy     Family History  Problem Relation Age of Onset  . Heart disease Mother   . Asthma Father   . Emphysema Father     Allergies  Allergen Reactions  . Codeine     REACTION: hallucinations  . Niacin     REACTION: rash    Current Outpatient Prescriptions on File Prior to Visit  Medication Sig Dispense Refill  . aspirin 81 MG tablet Take 81 mg by mouth daily.        . Calcium 600-200 MG-UNIT per tablet Take 2 tablets by mouth daily.        . Coenzyme Q10 (COQ10) 200 MG CAPS Take 1 capsule by mouth daily.        . Garlic Oil (ODORLESS GARLIC) 500 MG TABS Take 1 tablet by mouth daily.        . Multiple Vitamin (MULTIVITAMIN) tablet Take 1 tablet by mouth daily.        . OMEGA 3 1000 MG CAPS Take 1 capsule by mouth 2 (two) times daily.        . simvastatin (ZOCOR) 20 MG tablet Take 1 tablet (20 mg  total) by mouth daily.  90 tablet  1  . Thiamine HCl (VITAMIN B-1) 100 MG tablet Take 100 mg by mouth daily.        . Vitamin Mixture (ESTER-C) 500-60 MG TABS Take 1 tablet by mouth daily.        Marland Kitchen albuterol (PROAIR HFA) 108 (90 BASE) MCG/ACT inhaler Inhale 2 puffs into the lungs 4 (four) times daily as needed.        Chilton Si Tea 315 MG CAPS Take 1 capsule by mouth daily.          BP 146/80  Pulse 84  Temp(Src) 97.7 F (36.5 C) (Oral)  Resp 16  Ht 5' 4.5" (1.638 m)  Wt 126 lb 1.3 oz (57.19 kg)  BMI 21.31 kg/m2       Objective:   Physical Exam  Constitutional: She is oriented to person, place, and time. She appears well-developed and well-nourished.  HENT:  Head: Normocephalic and atraumatic.  Eyes: Conjunctivae are normal. Pupils are equal, round, and reactive to light.  Neck: Normal range of motion. Neck supple. No tracheal deviation present. No thyromegaly present.  Cardiovascular: Normal  rate and regular rhythm.   Pulmonary/Chest: Effort normal and breath sounds normal.  Abdominal: Soft. Bowel sounds are normal.  Genitourinary:       Normal cervix, normal introitus, no adnexal fullness, normal uterine size.  Breast exam- no masses.   Musculoskeletal: She exhibits no edema.  Neurological: She is alert and oriented to person, place, and time. She displays normal reflexes. She exhibits normal muscle tone.       Bilateral UE hand grasps/strength 5/5 Bilateral LE strength 5/5  Skin: Skin is warm and dry.       hirsuitism (face/breasts)  Psychiatric: She has a normal mood and affect. Her speech is normal and behavior is normal. Judgment and thought content normal.          Assessment & Plan:

## 2011-04-16 ENCOUNTER — Encounter: Payer: Self-pay | Admitting: Family

## 2011-04-16 ENCOUNTER — Telehealth: Payer: Self-pay | Admitting: Family

## 2011-04-16 DIAGNOSIS — R7303 Prediabetes: Secondary | ICD-10-CM | POA: Insufficient documentation

## 2011-04-16 DIAGNOSIS — R202 Paresthesia of skin: Secondary | ICD-10-CM

## 2011-04-16 NOTE — Telephone Encounter (Signed)
Cspine does show some disc disease. I would like her to complete an MRI of her Cspine and I will refer her to Neurosurgery for further evaluation.

## 2011-04-16 NOTE — Telephone Encounter (Signed)
Notified pt and her husband of xray and lab results. They both voice understanding. They are aware that Christine Keller will be contacting them re: MRI date and time.

## 2011-04-20 ENCOUNTER — Encounter: Payer: Self-pay | Admitting: Family

## 2011-04-25 ENCOUNTER — Other Ambulatory Visit (HOSPITAL_BASED_OUTPATIENT_CLINIC_OR_DEPARTMENT_OTHER): Payer: Medicare Other

## 2011-04-25 ENCOUNTER — Ambulatory Visit (HOSPITAL_BASED_OUTPATIENT_CLINIC_OR_DEPARTMENT_OTHER)
Admission: RE | Admit: 2011-04-25 | Discharge: 2011-04-25 | Disposition: A | Discharge status: Home / Self Care | Payer: Medicare Other | Source: Ambulatory Visit | Attending: Family | Admitting: Family

## 2011-04-25 DIAGNOSIS — M4802 Spinal stenosis, cervical region: Secondary | ICD-10-CM

## 2011-04-25 DIAGNOSIS — M542 Cervicalgia: Secondary | ICD-10-CM

## 2011-04-25 DIAGNOSIS — R202 Paresthesia of skin: Secondary | ICD-10-CM

## 2011-04-25 DIAGNOSIS — M5020 Other cervical disc displacement, unspecified cervical region: Secondary | ICD-10-CM | POA: Insufficient documentation

## 2011-04-25 DIAGNOSIS — M47812 Spondylosis without myelopathy or radiculopathy, cervical region: Secondary | ICD-10-CM | POA: Insufficient documentation

## 2011-04-25 DIAGNOSIS — M5030 Other cervical disc degeneration, unspecified cervical region: Secondary | ICD-10-CM | POA: Insufficient documentation

## 2011-04-25 DIAGNOSIS — M5090 Cervical disc disorder, unspecified, unspecified cervical region: Secondary | ICD-10-CM | POA: Insufficient documentation

## 2011-04-25 DIAGNOSIS — R209 Unspecified disturbances of skin sensation: Secondary | ICD-10-CM | POA: Insufficient documentation

## 2011-04-27 ENCOUNTER — Encounter: Payer: Self-pay | Admitting: Family

## 2011-04-27 ENCOUNTER — Other Ambulatory Visit: Payer: Medicare Other

## 2011-04-27 ENCOUNTER — Telehealth: Payer: Self-pay | Admitting: Family

## 2011-04-27 ENCOUNTER — Other Ambulatory Visit: Payer: Self-pay | Admitting: Family

## 2011-04-27 DIAGNOSIS — R202 Paresthesia of skin: Secondary | ICD-10-CM

## 2011-04-27 DIAGNOSIS — Z0000 Encounter for general adult medical examination without abnormal findings: Secondary | ICD-10-CM

## 2011-04-27 LAB — FECAL OCCULT BLOOD, IMMUNOCHEMICAL: Fecal Occult Bld: NEGATIVE

## 2011-04-27 NOTE — Telephone Encounter (Signed)
Reviewed findings of Cspine MRI with pt and husband as well as plans for referral to Neurosurgery for further evaluation.  They are agreeable.

## 2011-05-06 NOTE — Progress Notes (Signed)
  Subjective:    Patient ID: Christine Keller, female    DOB: March 21, 1941, 70 y.o.   MRN: [id number]  HPI    Review of Systems     Objective:   Physical Exam        Assessment & Plan:

## 2011-07-17 ENCOUNTER — Encounter: Payer: Self-pay | Admitting: Family Medicine

## 2011-07-23 ENCOUNTER — Ambulatory Visit: Payer: Medicare Other | Admitting: Internal Medicine

## 2011-08-10 ENCOUNTER — Ambulatory Visit: Payer: MEDICARE | Admitting: Internal Medicine

## 2011-08-19 ENCOUNTER — Ambulatory Visit (INDEPENDENT_AMBULATORY_CARE_PROVIDER_SITE_OTHER): Payer: Medicare Other | Admitting: Internal Medicine

## 2011-08-19 ENCOUNTER — Encounter: Payer: Self-pay | Admitting: Internal Medicine

## 2011-08-19 VITALS — BP 158/82 | HR 76 | Temp 98.1°F | Wt 132.0 lb

## 2011-08-19 DIAGNOSIS — I10 Essential (primary) hypertension: Secondary | ICD-10-CM

## 2011-08-19 DIAGNOSIS — Z23 Encounter for immunization: Secondary | ICD-10-CM

## 2011-08-19 DIAGNOSIS — G5600 Carpal tunnel syndrome, unspecified upper limb: Secondary | ICD-10-CM

## 2011-08-19 MED ORDER — VALSARTAN-HYDROCHLOROTHIAZIDE 160-25 MG PO TABS: 1.0000 | ORAL_TABLET | Freq: Every day | ORAL | Status: DC

## 2011-08-19 MED ORDER — AMLODIPINE BESYLATE 5 MG PO TABS: 2.5000 mg | ORAL_TABLET | Freq: Every day | ORAL | Status: DC

## 2011-08-19 NOTE — Progress Notes (Signed)
Subjective:    Patient ID: Christine Keller, female    DOB: 16-Aug-1941, 70 y.o.   MRN: [id number]  HPI  70 year old white female for followup regarding hypertension and carpal tunnel syndrome. Patient has been keeping a blood pressure log at home. Her systolic blood pressure readings have ranged from 130s to 150s despite changing to Diovan/hydrochlorothiazide. She denies headache or chest pain.  She has been going through workup for bilateral hand numbness. MRI of C-spine was negative for radiculopathy. She was seen by specialists who injected her left carpal tunnel. She has had mild improvement. She continues to also use left wrist splint.  Review of Systems See HPI  Past Medical History  Diagnosis Date  . Hyperlipidemia   . Hypertension   . Thyroid nodule   . Tobacco abuse   . COPD (chronic obstructive pulmonary disease) 1.31.11    PFT FEV1 1.19(56%), FVC 2.68(90%), FEV1% 44, TLC 5.80(115%), DLCO 52%  . Osteoporosis     OSTEOPENIA    History   Social History  . Marital Status: Married    Spouse Name: N/A    Number of Children: N/A  . Years of Education: N/A   Occupational History  . Retired     Diplomatic Services operational officer   Social History Main Topics  . Smoking status: Current Everyday Smoker    Types: Cigarettes  . Smokeless tobacco: Not on file   Comment: Less than 1 ppd.  . Alcohol Use: Not on file  . Drug Use: Not on file  . Sexually Active: Not on file   Other Topics Concern  . Not on file   Social History Narrative   Regular exercise:  3 x weeklyCaffeine Use:  occasional    Past Surgical History  Procedure Date  . Tonsillectomy     Family History  Problem Relation Age of Onset  . Heart disease Mother   . Hypertension Mother   . Asthma Father   . Emphysema Father   . Heart disease Brother     Allergies  Allergen Reactions  . Codeine     REACTION: hallucinations  . Niacin     REACTION: rash    Current Outpatient Prescriptions on File Prior to Visit    Medication Sig Dispense Refill  . albuterol (PROAIR HFA) 108 (90 BASE) MCG/ACT inhaler Inhale 2 puffs into the lungs 4 (four) times daily as needed.        Marland Kitchen aspirin 81 MG tablet Take 81 mg by mouth daily.        . Calcium 600-200 MG-UNIT per tablet Take 2 tablets by mouth daily.        . Coenzyme Q10 (COQ10) 200 MG CAPS Take 1 capsule by mouth daily.        . Garlic Oil (ODORLESS GARLIC) 500 MG TABS Take 1 tablet by mouth daily.        Chilton Si Tea 315 MG CAPS Take 1 capsule by mouth daily.        . Lactobacillus (ACIDOPHILUS) 100 MG CAPS Take 1 capsule by mouth daily.        . Multiple Vitamin (MULTIVITAMIN) tablet Take 1 tablet by mouth daily.        . OMEGA 3 1000 MG CAPS Take 1 capsule by mouth 2 (two) times daily.        . simvastatin (ZOCOR) 20 MG tablet Take 1 tablet (20 mg total) by mouth daily.  90 tablet  1  . Thiamine HCl (VITAMIN B-1) 100 MG tablet Take  100 mg by mouth daily.        Marland Kitchen tiotropium (SPIRIVA HANDIHALER) 18 MCG inhalation capsule Place 1 capsule (18 mcg total) into inhaler and inhale daily.  90 capsule  1  . Vitamin Mixture (ESTER-C) 500-60 MG TABS Take 1 tablet by mouth daily.          BP 158/82  Pulse 76  Temp(Src) 98.1 F (36.7 C) (Oral)  Wt 132 lb (59.875 kg)       Objective:   Physical Exam   Constitutional: Appears well-developed and well-nourished. No distress.  Head: Normocephalic and atraumatic.  Ear:  Right and left ear normal.  TMs clear.  Hearing is grossly normal Mouth/Throat: Oropharynx is clear and moist.  Eyes: Conjunctivae are normal. Pupils are equal, round, and reactive to light.  Neck: Normal range of motion. Neck supple. No thyromegaly present. No carotid bruit Cardiovascular: Normal rate, regular rhythm and normal heart sounds.  Exam reveals no gallop and no friction rub.  No murmur heard. Pulmonary/Chest: Effort normal and breath sounds normal.  No wheezes. No rales.  Abdominal: Soft. Bowel sounds are normal. No mass. There is no  tenderness.  Neurological: Alert. No cranial nerve deficit.  Skin: Skin is warm and dry.  Psychiatric: Normal mood and affect. Behavior is normal.        Assessment & Plan:

## 2011-08-19 NOTE — Assessment & Plan Note (Addendum)
MRI of C-spine was negative for radiculopathy. Patient was seen by specialist who injected her left carpal tunnel with mild to moderate symptom improvement. Continue to observe for now. We discussed referral to hand surgeon for carpal tunnel surgery if her symptoms worsen.  (Dr. Teressa Senter)

## 2011-08-19 NOTE — Assessment & Plan Note (Signed)
Patient's blood pressure is still suboptimally controlled. Her home readings are somewhat better than office readings. Amlodipine 2.5 mg. BP: 158/82 mmHg

## 2011-09-30 ENCOUNTER — Ambulatory Visit (INDEPENDENT_AMBULATORY_CARE_PROVIDER_SITE_OTHER): Payer: Medicare Other | Admitting: Internal Medicine

## 2011-09-30 ENCOUNTER — Encounter: Payer: Self-pay | Admitting: Internal Medicine

## 2011-09-30 VITALS — BP 142/82 | HR 68 | Temp 98.0°F | Wt 133.0 lb

## 2011-09-30 DIAGNOSIS — I10 Essential (primary) hypertension: Secondary | ICD-10-CM

## 2011-09-30 MED ORDER — AMLODIPINE BESYLATE 5 MG PO TABS: 5.0000 mg | ORAL_TABLET | Freq: Every day | ORAL | Status: DC

## 2011-09-30 NOTE — Patient Instructions (Signed)
Continue to monitor your blood pressure at home. Take 1/2 of simvastatin or 10 mg as directed.

## 2011-09-30 NOTE — Assessment & Plan Note (Addendum)
Home blood pressure readings are still suboptimal. Systolic blood pressure ranges in the high 130s. Increase amlodipine to 5 mg. Due to interaction with amlodipine decreased simvastatin 10 mg BP: 142/82 mmHg

## 2011-09-30 NOTE — Progress Notes (Signed)
Subjective:    Patient ID: Christine Keller, female    DOB: 1941/03/02, 70 y.o.   MRN: [id number]  HPI  70 year old white female with hypertension for followup. At previous office visit patient was started on amlodipine 2.5 mg. She has been tolerating well without any side effects. She denies headache or edema.  She takes simvastatin 20 mg once daily for hyperlipidemia.  Review of Systems Negative for chest pain, negative for shortness of breath    Past Medical History  Diagnosis Date  . Hyperlipidemia   . Hypertension   . Thyroid nodule   . Tobacco abuse   . COPD (chronic obstructive pulmonary disease) 1.31.11    PFT FEV1 1.19(56%), FVC 2.68(90%), FEV1% 44, TLC 5.80(115%), DLCO 52%  . Osteoporosis     OSTEOPENIA    History   Social History  . Marital Status: Married    Spouse Name: N/A    Number of Children: N/A  . Years of Education: N/A   Occupational History  . Retired     Diplomatic Services operational officer   Social History Main Topics  . Smoking status: Current Everyday Smoker    Types: Cigarettes  . Smokeless tobacco: Not on file   Comment: Less than 1 ppd.  . Alcohol Use: Not on file  . Drug Use: Not on file  . Sexually Active: Not on file   Other Topics Concern  . Not on file   Social History Narrative   Regular exercise:  3 x weeklyCaffeine Use:  occasional    Past Surgical History  Procedure Date  . Tonsillectomy     Family History  Problem Relation Age of Onset  . Heart disease Mother   . Hypertension Mother   . Asthma Father   . Emphysema Father   . Heart disease Brother     Allergies  Allergen Reactions  . Codeine     REACTION: hallucinations  . Niacin     REACTION: rash    Current Outpatient Prescriptions on File Prior to Visit  Medication Sig Dispense Refill  . albuterol (PROAIR HFA) 108 (90 BASE) MCG/ACT inhaler Inhale 2 puffs into the lungs 4 (four) times daily as needed.        Marland Kitchen amLODipine (NORVASC) 5 MG tablet Take 0.5 tablets (2.5 mg total) by  mouth daily.  30 tablet  1  . aspirin 81 MG tablet Take 81 mg by mouth daily.        . Calcium 600-200 MG-UNIT per tablet Take 2 tablets by mouth daily.        . Coenzyme Q10 (COQ10) 200 MG CAPS Take 1 capsule by mouth daily.        . Garlic Oil (ODORLESS GARLIC) 500 MG TABS Take 1 tablet by mouth daily.        Chilton Si Tea 315 MG CAPS Take 1 capsule by mouth daily.        . Lactobacillus (ACIDOPHILUS) 100 MG CAPS Take 1 capsule by mouth daily.        . Multiple Vitamin (MULTIVITAMIN) tablet Take 1 tablet by mouth daily.        . OMEGA 3 1000 MG CAPS Take 1 capsule by mouth 2 (two) times daily.        . simvastatin (ZOCOR) 20 MG tablet Take 1 tablet (20 mg total) by mouth daily.  90 tablet  1  . Thiamine HCl (VITAMIN B-1) 100 MG tablet Take 100 mg by mouth daily.        Marland Kitchen  tiotropium (SPIRIVA HANDIHALER) 18 MCG inhalation capsule Place 1 capsule (18 mcg total) into inhaler and inhale daily.  90 capsule  1  . valsartan-hydrochlorothiazide (DIOVAN-HCT) 160-25 MG per tablet Take 1 tablet by mouth daily.  90 tablet  1  . Vitamin Mixture (ESTER-C) 500-60 MG TABS Take 1 tablet by mouth daily.          BP 142/82  Pulse 68  Temp(Src) 98 F (36.7 C) (Oral)  Wt 133 lb (60.328 kg)    Objective:   Physical Exam  Constitutional: She appears well-developed and well-nourished.  Cardiovascular: Normal rate, regular rhythm and normal heart sounds.   Pulmonary/Chest: Effort normal and breath sounds normal. No respiratory distress. She has no wheezes.  Musculoskeletal: She exhibits no edema.  Skin: Skin is warm.  Psychiatric: She has a normal mood and affect. Her behavior is normal.          Assessment & Plan:

## 2011-11-10 ENCOUNTER — Telehealth: Payer: Self-pay | Admitting: Internal Medicine

## 2011-11-10 NOTE — Telephone Encounter (Signed)
Pt hus is aware 415 on 11-11-2011

## 2011-11-10 NOTE — Telephone Encounter (Signed)
Pt  is requesting ov with Dr Artist Pais follow up on htn(thinks med is not working). Can I double book tomorrow. Pt decline to see another MD today.

## 2011-11-10 NOTE — Telephone Encounter (Signed)
Pt can come in at 4:15

## 2011-11-11 ENCOUNTER — Encounter: Payer: Self-pay | Admitting: Internal Medicine

## 2011-11-11 ENCOUNTER — Ambulatory Visit (INDEPENDENT_AMBULATORY_CARE_PROVIDER_SITE_OTHER): Payer: Medicare Other | Admitting: Internal Medicine

## 2011-11-11 VITALS — BP 136/82 | HR 76 | Temp 98.0°F | Wt 129.0 lb

## 2011-11-11 DIAGNOSIS — I10 Essential (primary) hypertension: Secondary | ICD-10-CM

## 2011-11-11 NOTE — Progress Notes (Signed)
Subjective:    Patient ID: Christine Keller, female    DOB: [birth year], 71 y.o.   MRN: [id number]  HPI  71 year old white female for followup regarding hypertension. Previous visit amlodipine was increased to 5 mg. She has been tolerating this well. She denies any lower extremity edema. She has been monitoring her blood pressure at home and her systolic readings have been ranging in the mid 130s.  She continues to smoke.  Review of Systems Negative for chest pain    Past Medical History  Diagnosis Date  . Hyperlipidemia   . Hypertension   . Thyroid nodule   . Tobacco abuse   . COPD (chronic obstructive pulmonary disease) 1.31.11    PFT FEV1 1.19(56%), FVC 2.68(90%), FEV1% 44, TLC 5.80(115%), DLCO 52%  . Osteoporosis     OSTEOPENIA    History   Social History  . Marital Status: Married    Spouse Name: N/A    Number of Children: N/A  . Years of Education: N/A   Occupational History  . Retired     Diplomatic Services operational officer   Social History Main Topics  . Smoking status: Current Everyday Smoker    Types: Cigarettes  . Smokeless tobacco: Not on file   Comment: Less than 1 ppd.  . Alcohol Use: Not on file  . Drug Use: Not on file  . Sexually Active: Not on file   Other Topics Concern  . Not on file   Social History Narrative   Regular exercise:  3 x weeklyCaffeine Use:  occasional    Past Surgical History  Procedure Date  . Tonsillectomy     Family History  Problem Relation Age of Onset  . Heart disease Mother   . Hypertension Mother   . Asthma Father   . Emphysema Father   . Heart disease Brother     Allergies  Allergen Reactions  . Codeine     REACTION: hallucinations  . Niacin     REACTION: rash    Current Outpatient Prescriptions on File Prior to Visit  Medication Sig Dispense Refill  . albuterol (PROAIR HFA) 108 (90 BASE) MCG/ACT inhaler Inhale 2 puffs into the lungs 4 (four) times daily as needed.        Marland Kitchen amLODipine (NORVASC) 5 MG tablet Take 1 tablet (5 mg  total) by mouth daily.  90 tablet  1  . aspirin 81 MG tablet Take 81 mg by mouth daily.        . Calcium 600-200 MG-UNIT per tablet Take 2 tablets by mouth daily.        . Coenzyme Q10 (COQ10) 200 MG CAPS Take 1 capsule by mouth daily.        . Garlic Oil (ODORLESS GARLIC) 500 MG TABS Take 1 tablet by mouth daily.        Chilton Si Tea 315 MG CAPS Take 1 capsule by mouth daily.        . Lactobacillus (ACIDOPHILUS) 100 MG CAPS Take 1 capsule by mouth daily.        . Multiple Vitamin (MULTIVITAMIN) tablet Take 1 tablet by mouth daily.        . OMEGA 3 1000 MG CAPS Take 1 capsule by mouth 2 (two) times daily.        . simvastatin (ZOCOR) 20 MG tablet Take 0.5 tablets (10 mg total) by mouth daily.  90 tablet  1  . Thiamine HCl (VITAMIN B-1) 100 MG tablet Take 100 mg by mouth daily.        Marland Kitchen  tiotropium (SPIRIVA HANDIHALER) 18 MCG inhalation capsule Place 1 capsule (18 mcg total) into inhaler and inhale daily.  90 capsule  1  . valsartan-hydrochlorothiazide (DIOVAN-HCT) 160-25 MG per tablet Take 1 tablet by mouth daily.  90 tablet  1  . Vitamin Mixture (ESTER-C) 500-60 MG TABS Take 1 tablet by mouth daily.          BP 136/82  Pulse 76  Temp(Src) 98 F (36.7 C) (Oral)  Wt 129 lb (58.514 kg)    Objective:   Physical Exam  Constitutional:       Thin, pleasant 71 year old female  Cardiovascular: Normal rate, regular rhythm and normal heart sounds.   Pulmonary/Chest:       Prolonged expiration.  Decreased breath sounds throughout  Musculoskeletal: She exhibits no edema.  Skin: Skin is warm and dry.       Assessment & Plan:

## 2011-11-11 NOTE — Patient Instructions (Signed)
Please complete the following lab tests in March 2013: BMET - 401.9 LFTs, Fasting Lipid - 272.4

## 2011-11-11 NOTE — Assessment & Plan Note (Addendum)
Blood pressure has improved. BP with manual cuff and left arm is 130/70 in right arm 124/70.  Monitor BMET.

## 2011-12-09 ENCOUNTER — Other Ambulatory Visit: Payer: Self-pay | Admitting: *Deleted

## 2011-12-09 MED ORDER — VALSARTAN-HYDROCHLOROTHIAZIDE 160-25 MG PO TABS: 1.0000 | ORAL_TABLET | Freq: Every day | ORAL | Status: DC

## 2011-12-09 MED ORDER — TIOTROPIUM BROMIDE MONOHYDRATE 18 MCG IN CAPS: 18.0000 ug | ORAL_CAPSULE | Freq: Every day | RESPIRATORY_TRACT | Status: DC

## 2011-12-10 MED ORDER — TIOTROPIUM BROMIDE MONOHYDRATE 18 MCG IN CAPS: 18.0000 ug | ORAL_CAPSULE | Freq: Every day | RESPIRATORY_TRACT | Status: DC

## 2011-12-10 NOTE — Telephone Encounter (Signed)
Addended by: Alfred Levins D on: 12/10/2011 11:44 AM   Modules accepted: Orders

## 2011-12-14 ENCOUNTER — Other Ambulatory Visit: Payer: Self-pay | Admitting: Internal Medicine

## 2011-12-14 NOTE — Telephone Encounter (Signed)
Pt needs news rxs tiotropium 18 mcg and valsartan-hctz 160-25mg  for 90day supply with 3 refills call into [facility] [phone]

## 2011-12-15 MED ORDER — TIOTROPIUM BROMIDE MONOHYDRATE 18 MCG IN CAPS: 18.0000 ug | ORAL_CAPSULE | Freq: Every day | RESPIRATORY_TRACT | Status: DC

## 2011-12-15 MED ORDER — VALSARTAN-HYDROCHLOROTHIAZIDE 160-25 MG PO TABS: 1.0000 | ORAL_TABLET | Freq: Every day | ORAL | Status: DC

## 2011-12-15 NOTE — Telephone Encounter (Signed)
This was taken care of on 12/10/11 but I resubmitted to pharmacy electronically

## 2011-12-30 ENCOUNTER — Other Ambulatory Visit: Payer: Medicare Other

## 2011-12-30 ENCOUNTER — Ambulatory Visit: Payer: Medicare Other | Admitting: Internal Medicine

## 2012-01-08 ENCOUNTER — Other Ambulatory Visit (INDEPENDENT_AMBULATORY_CARE_PROVIDER_SITE_OTHER): Payer: Medicare Other

## 2012-01-08 DIAGNOSIS — E785 Hyperlipidemia, unspecified: Secondary | ICD-10-CM

## 2012-01-08 DIAGNOSIS — I10 Essential (primary) hypertension: Secondary | ICD-10-CM

## 2012-01-08 LAB — HEPATIC FUNCTION PANEL
ALT: 22 U/L (ref 0–35)
AST: 27 U/L (ref 0–37)
Albumin: 4.3 g/dL (ref 3.5–5.2)
Alkaline Phosphatase: 64 U/L (ref 39–117)
Bilirubin, Direct: 0 mg/dL (ref 0.0–0.3)
Total Bilirubin: 0.7 mg/dL (ref 0.3–1.2)
Total Protein: 7.5 g/dL (ref 6.0–8.3)

## 2012-01-08 LAB — BASIC METABOLIC PANEL
BUN: 18 mg/dL (ref 6–23)
CO2: 30 mEq/L (ref 19–32)
Calcium: 9.9 mg/dL (ref 8.4–10.5)
Chloride: 100 mEq/L (ref 96–112)
Creatinine, Ser: 0.9 mg/dL (ref 0.4–1.2)
GFR: 68.99 mL/min (ref >60.00)
Glucose, Bld: 117 mg/dL — ABNORMAL HIGH (ref 70–99)
Potassium: 4 mEq/L (ref 3.5–5.1)
Sodium: 140 mEq/L (ref 135–145)

## 2012-01-08 LAB — LIPID PANEL
Cholesterol: 199 mg/dL (ref 0–200)
HDL: 36 mg/dL — ABNORMAL LOW (ref >39.00)
Total CHOL/HDL Ratio: 6
Triglycerides: 339 mg/dL — ABNORMAL HIGH (ref 0.0–149.0)
VLDL: 67.8 mg/dL — ABNORMAL HIGH (ref 0.0–40.0)

## 2012-01-08 LAB — LDL CHOLESTEROL, DIRECT: Direct LDL: 95.4 mg/dL

## 2012-01-11 ENCOUNTER — Encounter: Payer: Self-pay | Admitting: Internal Medicine

## 2012-03-11 ENCOUNTER — Telehealth: Payer: Self-pay | Admitting: Internal Medicine

## 2012-03-11 MED ORDER — AMLODIPINE BESYLATE 5 MG PO TABS: 5.0000 mg | ORAL_TABLET | Freq: Every day | ORAL | Status: DC

## 2012-03-11 NOTE — Telephone Encounter (Signed)
Pt request refill on amLODipine (NORVASC) 5 MG tablet   [facility]

## 2012-03-11 NOTE — Telephone Encounter (Signed)
I sent script e-scribe. 

## 2012-04-28 ENCOUNTER — Telehealth: Payer: Self-pay | Admitting: Internal Medicine

## 2012-04-28 DIAGNOSIS — G5600 Carpal tunnel syndrome, unspecified upper limb: Secondary | ICD-10-CM

## 2012-04-28 NOTE — Telephone Encounter (Signed)
Arline Asp, please place order for referral to orthopedic specialist - Dr. Teressa Senter re: carpal tunnel syndrome

## 2012-04-28 NOTE — Telephone Encounter (Signed)
Patient's spouse called stating that his wife would like to be referred to a surgeon for her carpel tunnel in her hands. Please advise.

## 2012-04-28 NOTE — Telephone Encounter (Signed)
Referral order entered

## 2012-05-11 ENCOUNTER — Encounter: Payer: Medicare Other | Admitting: Internal Medicine

## 2012-05-16 ENCOUNTER — Other Ambulatory Visit: Payer: Self-pay | Admitting: Internal Medicine

## 2012-05-16 ENCOUNTER — Encounter: Payer: Self-pay | Admitting: Internal Medicine

## 2012-05-16 ENCOUNTER — Ambulatory Visit (INDEPENDENT_AMBULATORY_CARE_PROVIDER_SITE_OTHER): Payer: Medicare Other | Admitting: Internal Medicine

## 2012-05-16 VITALS — BP 158/82 | Temp 98.4°F | Ht 64.0 in | Wt 127.0 lb

## 2012-05-16 DIAGNOSIS — D4959 Neoplasm of unspecified behavior of other genitourinary organ: Secondary | ICD-10-CM | POA: Insufficient documentation

## 2012-05-16 DIAGNOSIS — I10 Essential (primary) hypertension: Secondary | ICD-10-CM

## 2012-05-16 DIAGNOSIS — Z0000 Encounter for general adult medical examination without abnormal findings: Secondary | ICD-10-CM

## 2012-05-16 DIAGNOSIS — Z1231 Encounter for screening mammogram for malignant neoplasm of breast: Secondary | ICD-10-CM

## 2012-05-16 DIAGNOSIS — L680 Hirsutism: Secondary | ICD-10-CM

## 2012-05-16 MED ORDER — SIMVASTATIN 20 MG PO TABS: 10.0000 mg | ORAL_TABLET | Freq: Every day | ORAL | Status: DC

## 2012-05-16 MED ORDER — AMLODIPINE BESYLATE 10 MG PO TABS: 10.0000 mg | ORAL_TABLET | Freq: Every day | ORAL | Status: DC

## 2012-05-16 NOTE — Assessment & Plan Note (Signed)
Patient with significant hirsutism.  No previous workup.  Refer to Dr. Sharl Ma for further evaluation.

## 2012-05-16 NOTE — Assessment & Plan Note (Signed)
Blood pressure is suboptimally controlled. Increase amlodipine to 10 mg.  Continue Diovan/HCTZ 160/12.5 mg once daily.  BP: 158/82 mmHg  Lab Results  Component Value Date   CREATININE 0.9 01/08/2012

## 2012-05-16 NOTE — Progress Notes (Signed)
Subjective:    Patient ID: Christine Keller, female    DOB: 24-Aug-1941, 71 y.o.   MRN: [id number]  HPI  71 year old white female with history of hypertension, carpal tunnel syndrome and chronic tobacco use for followup. Patient previously referred to hand surgeon for carpal tunnel syndrome. Surgery planned for August 22.  Hypertension-patient reports good medication compliance. She has frequent elevated blood pressure readings at home. This is despite taking all of her medications.  COPD-she is using her maintenance inhalers. She is not ready to quit smoking.   Review of Systems    negative for chest pain or shortness of breath. Patient reports she is due for her mammogram. She has struggled  with hirsutism for over 10 years. No previous workup.  Past Medical History  Diagnosis Date  . Hyperlipidemia   . Hypertension   . Thyroid nodule   . Tobacco abuse   . COPD (chronic obstructive pulmonary disease) 1.31.11    PFT FEV1 1.19(56%), FVC 2.68(90%), FEV1% 44, TLC 5.80(115%), DLCO 52%  . Osteoporosis     OSTEOPENIA    History   Social History  . Marital Status: Married    Spouse Name: N/A    Number of Children: N/A  . Years of Education: N/A   Occupational History  . Retired     Diplomatic Services operational officer   Social History Main Topics  . Smoking status: Current Everyday Smoker    Types: Cigarettes  . Smokeless tobacco: Not on file   Comment: Less than 1 ppd.  . Alcohol Use: Not on file  . Drug Use: Not on file  . Sexually Active: Not on file   Other Topics Concern  . Not on file   Social History Narrative   Regular exercise:  3 x weeklyCaffeine Use:  occasional    Past Surgical History  Procedure Date  . Tonsillectomy     Family History  Problem Relation Age of Onset  . Heart disease Mother   . Hypertension Mother   . Asthma Father   . Emphysema Father   . Heart disease Brother     Allergies  Allergen Reactions  . Codeine     REACTION: hallucinations  . Niacin    REACTION: rash    Current Outpatient Prescriptions on File Prior to Visit  Medication Sig Dispense Refill  . aspirin 81 MG tablet Take 81 mg by mouth daily.        . Calcium 600-200 MG-UNIT per tablet Take 2 tablets by mouth daily.        . Coenzyme Q10 (COQ10) 200 MG CAPS Take 1 capsule by mouth daily.        . Garlic Oil (ODORLESS GARLIC) 500 MG TABS Take 1 tablet by mouth daily.        Chilton Si Tea 315 MG CAPS Take 1 capsule by mouth daily.        . Multiple Vitamin (MULTIVITAMIN) tablet Take 1 tablet by mouth daily.        . OMEGA 3 1000 MG CAPS Take 1 capsule by mouth 2 (two) times daily.        . Thiamine HCl (VITAMIN B-1) 100 MG tablet Take 100 mg by mouth daily.        Marland Kitchen tiotropium (SPIRIVA HANDIHALER) 18 MCG inhalation capsule Place 1 capsule (18 mcg total) into inhaler and inhale daily.  90 capsule  3  . valsartan-hydrochlorothiazide (DIOVAN-HCT) 160-25 MG per tablet Take 1 tablet by mouth daily.  90 tablet  3  .  Vitamin Mixture (ESTER-C) 500-60 MG TABS Take 1 tablet by mouth daily.        Marland Kitchen DISCONTD: amLODipine (NORVASC) 5 MG tablet Take 1 tablet (5 mg total) by mouth daily.  90 tablet  1  . DISCONTD: simvastatin (ZOCOR) 20 MG tablet Take 0.5 tablets (10 mg total) by mouth daily.  90 tablet  1    BP 158/82  Temp 98.4 F (36.9 C) (Oral)  Ht 5\' 4"  (1.626 m)  Wt 127 lb (57.607 kg)  BMI 21.80 kg/m2    Objective:   Physical Exam  Constitutional: She is oriented to person, place, and time.       Thin, pleasant 71 year old white female  HENT:  Head: Normocephalic and atraumatic.  Right Ear: External ear normal.  Left Ear: External ear normal.  Mouth/Throat: Oropharynx is clear and moist.       Coarse facial hair  Eyes: EOM are normal. Pupils are equal, round, and reactive to light.  Neck: Neck supple.       No carotid bruit  Cardiovascular: Normal rate, regular rhythm and normal heart sounds.   No murmur heard. Pulmonary/Chest: Effort normal and breath sounds normal.  She has no wheezes.       Breast exam - no mass bilaterally  Abdominal: Soft. Bowel sounds are normal. She exhibits no mass. There is no tenderness.  Genitourinary: Guaiac negative stool.  Musculoskeletal: Normal range of motion.        Trace lower extremity edema bilaterally  Neurological: She is alert and oriented to person, place, and time. No cranial nerve deficit.  Skin: Skin is warm and dry.       Coarse body hair bilateral breasts, and over abdomen.  Psychiatric: She has a normal mood and affect. Her behavior is normal.       Assessment & Plan:

## 2012-05-23 ENCOUNTER — Inpatient Hospital Stay (HOSPITAL_BASED_OUTPATIENT_CLINIC_OR_DEPARTMENT_OTHER): Admission: RE | Admit: 2012-05-23 | Payer: Medicare Other | Source: Ambulatory Visit

## 2012-05-23 ENCOUNTER — Ambulatory Visit (HOSPITAL_BASED_OUTPATIENT_CLINIC_OR_DEPARTMENT_OTHER)
Admission: RE | Admit: 2012-05-23 | Discharge: 2012-05-23 | Disposition: A | Discharge status: Home / Self Care | Payer: 59 | Source: Ambulatory Visit | Attending: Internal Medicine | Admitting: Internal Medicine

## 2012-05-23 DIAGNOSIS — Z1231 Encounter for screening mammogram for malignant neoplasm of breast: Secondary | ICD-10-CM | POA: Insufficient documentation

## 2012-05-24 ENCOUNTER — Other Ambulatory Visit: Payer: Self-pay | Admitting: Orthopedic Surgery

## 2012-05-25 ENCOUNTER — Other Ambulatory Visit: Payer: Self-pay

## 2012-05-25 ENCOUNTER — Encounter (HOSPITAL_BASED_OUTPATIENT_CLINIC_OR_DEPARTMENT_OTHER)
Admission: RE | Admit: 2012-05-25 | Discharge: 2012-05-25 | Disposition: A | Discharge status: Home / Self Care | Payer: Medicare Other | Source: Ambulatory Visit | Attending: Orthopedic Surgery | Admitting: Orthopedic Surgery

## 2012-05-25 ENCOUNTER — Encounter (HOSPITAL_BASED_OUTPATIENT_CLINIC_OR_DEPARTMENT_OTHER): Payer: Self-pay | Admitting: *Deleted

## 2012-05-25 LAB — BASIC METABOLIC PANEL
BUN: 16 mg/dL (ref 6–23)
CO2: 30 mEq/L (ref 19–32)
Calcium: 10.8 mg/dL — ABNORMAL HIGH (ref 8.4–10.5)
Chloride: 103 mEq/L (ref 96–112)
Creatinine, Ser: 0.84 mg/dL (ref 0.50–1.10)
GFR calc Af Amer: 79 mL/min — ABNORMAL LOW (ref >90)
GFR calc non Af Amer: 68 mL/min — ABNORMAL LOW (ref >90)
Glucose, Bld: 122 mg/dL — ABNORMAL HIGH (ref 70–99)
Potassium: 4.1 mEq/L (ref 3.5–5.1)
Sodium: 142 mEq/L (ref 135–145)

## 2012-05-25 NOTE — H&P (Addendum)
Christine Keller is an 71 y.o. female.   Chief Complaint: c/o chronic and progressive numbness and tingling left hand  HPI: Christine Keller is a 71 year old woman who previously lived in [place], [place]. She has a history of bilateral hand and arm discomfort. She has had detailed electrodiagnostic studies completed by Dr. Anne Hahn in August of 2012. These revealed evidence of bilateral carpal tunnel syndrome, left worse than right, bilateral ulnar neuropathy at the elbows, left worse than right. 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 She has had a work up in [facility] including a C-spine MRI and plain films of the cervical spine. She was advised she did not have significant cervical root impairment. She now seeks an upper extremity orthopaedic consult.    Past Medical History  Diagnosis Date  . Hyperlipidemia   . Hypertension   . Thyroid nodule   . Tobacco abuse   . COPD (chronic obstructive pulmonary disease) 1.31.11    PFT FEV1 1.19(56%), FVC 2.68(90%), FEV1% 44, TLC 5.80(115%), DLCO 52%  . Osteoporosis     OSTEOPENIA  . Bilateral carpal tunnel syndrome   . Wears dentures     upper-partial bottom  . Wears glasses     Past Surgical History  Procedure Date  . Tonsillectomy     Family History  Problem Relation Age of Onset  . Heart disease Mother   . Hypertension Mother   . Asthma Father   . Emphysema Father   . Heart disease Brother    Social History:  reports that she has been smoking Cigarettes.  She has been smoking about 1 pack per day. She does not have any smokeless tobacco history on file. She reports that she drinks alcohol. She reports that she does not use illicit drugs.  Allergies:  Allergies  Allergen Reactions  . Codeine     REACTION: hallucinations  . Niacin     REACTION: rash    No prescriptions prior to admission    Results for orders placed during the hospital encounter of  05/26/12 (from the past 48 hour(s))  BASIC METABOLIC PANEL     Status: Abnormal   Collection Time   05/25/12 12:00 PM      Component Value Range Comment   Sodium 142  135 - 145 mEq/L    Potassium 4.1  3.5 - 5.1 mEq/L    Chloride 103  96 - 112 mEq/L    CO2 30  19 - 32 mEq/L    Glucose, Bld 122 (*) 70 - 99 mg/dL    BUN 16  6 - 23 mg/dL    Creatinine, Ser 1.61  0.50 - 1.10 mg/dL    Calcium 09.6 (*) 8.4 - 10.5 mg/dL    GFR calc non Af Amer 68 (*) >90 mL/min    GFR calc Af Amer 79 (*) >90 mL/min     No results found.   Pertinent items are noted in HPI.  Height 5\' 4"  (1.626 m), weight 57.607 kg (127 lb).  General appearance: alert Head: Normocephalic, without obvious abnormality Neck: supple, symmetrical, trachea midline Resp:decreased breath sounds bilaterally especially at the bases. No rales/rhonchi Cardio: regular rate and rhythm GI: normal findings: bowel sounds normal Extremities:. Inspection of her hands reveals no muscle atrophy. She has a type I Z-collapse of her right thumb and hypertrophic osteoarthritis at the [facility] joint. She does not have bone spur deformity of the left  thumb CMC joint. She has full ROM of her fingers in flexion/extension. There is no sign of stenosing tenosynovitis. Her pulses and capillary refill are intact. She has diminished sensibility in the median distribution bilaterally. She has irritability of the ulnar nerve. She reports that she has numbness at night in all fingers.  The electrodiagnostic studies performed by Dr. Anne Hahn in August of 2012 were reviewed. Dr. Anne Hahn notes moderate left and mild right median neuropathy at wrist level and bilateral ulnar neuropathy at the elbows. Reviewing Dr. Anne Hahn' data reveals a conduction velocity across the elbow 37.3 meters per second on the left and 44.6 meters per second on the right.   Pulses:2+ and symmetric Skin: WNL Neurologic: alert and oriented    Assessment/Plan Impression: Left CTS and ulnar nerve  compression left cubital tunnel and right CMC arthrosis  Plan: To the OR  for left CTR and decompression ulnar nerve left cubital tunnel and possible right thumb CMC injection.The procedure, risks,benefits and post-op course were discussed with the patient at length and they were in agreement with the plan.   DASNOIT,Christine Keller 05/25/2012, 2:31 PM     Wyn Forster., MD

## 2012-05-25 NOTE — Progress Notes (Signed)
To come in for ekg-bmet Lung have been stable, does have copd-husky smokers voice. Being done regional anesth

## 2012-05-26 ENCOUNTER — Encounter (HOSPITAL_BASED_OUTPATIENT_CLINIC_OR_DEPARTMENT_OTHER): Payer: Self-pay | Admitting: Certified Registered Nurse Anesthetist

## 2012-05-26 ENCOUNTER — Ambulatory Visit (HOSPITAL_BASED_OUTPATIENT_CLINIC_OR_DEPARTMENT_OTHER): Payer: Medicare Other | Admitting: Certified Registered Nurse Anesthetist

## 2012-05-26 ENCOUNTER — Ambulatory Visit (HOSPITAL_BASED_OUTPATIENT_CLINIC_OR_DEPARTMENT_OTHER)
Admission: RE | Admit: 2012-05-26 | Discharge: 2012-05-26 | Disposition: A | Discharge status: Home / Self Care | Payer: Medicare Other | Source: Ambulatory Visit | Attending: Orthopedic Surgery | Admitting: Orthopedic Surgery

## 2012-05-26 ENCOUNTER — Encounter (HOSPITAL_BASED_OUTPATIENT_CLINIC_OR_DEPARTMENT_OTHER): Payer: Self-pay | Admitting: *Deleted

## 2012-05-26 ENCOUNTER — Encounter (HOSPITAL_BASED_OUTPATIENT_CLINIC_OR_DEPARTMENT_OTHER)
Admission: RE | Disposition: A | Discharge status: Home / Self Care | Payer: Self-pay | Source: Ambulatory Visit | Attending: Orthopedic Surgery

## 2012-05-26 DIAGNOSIS — G5620 Lesion of ulnar nerve, unspecified upper limb: Secondary | ICD-10-CM | POA: Insufficient documentation

## 2012-05-26 DIAGNOSIS — Z01810 Encounter for preprocedural cardiovascular examination: Secondary | ICD-10-CM | POA: Insufficient documentation

## 2012-05-26 DIAGNOSIS — M19049 Primary osteoarthritis, unspecified hand: Secondary | ICD-10-CM | POA: Insufficient documentation

## 2012-05-26 DIAGNOSIS — J449 Chronic obstructive pulmonary disease, unspecified: Secondary | ICD-10-CM | POA: Insufficient documentation

## 2012-05-26 DIAGNOSIS — E785 Hyperlipidemia, unspecified: Secondary | ICD-10-CM | POA: Insufficient documentation

## 2012-05-26 DIAGNOSIS — M810 Age-related osteoporosis without current pathological fracture: Secondary | ICD-10-CM | POA: Insufficient documentation

## 2012-05-26 DIAGNOSIS — J4489 Other specified chronic obstructive pulmonary disease: Secondary | ICD-10-CM | POA: Insufficient documentation

## 2012-05-26 DIAGNOSIS — Z01812 Encounter for preprocedural laboratory examination: Secondary | ICD-10-CM | POA: Insufficient documentation

## 2012-05-26 DIAGNOSIS — F17200 Nicotine dependence, unspecified, uncomplicated: Secondary | ICD-10-CM | POA: Insufficient documentation

## 2012-05-26 DIAGNOSIS — G5600 Carpal tunnel syndrome, unspecified upper limb: Secondary | ICD-10-CM | POA: Insufficient documentation

## 2012-05-26 HISTORY — PX: CARPAL TUNNEL RELEASE: SHX101

## 2012-05-26 HISTORY — PX: ULNAR NERVE TRANSPOSITION: SHX2595

## 2012-05-26 HISTORY — DX: Carpal tunnel syndrome, bilateral upper limbs: G56.03

## 2012-05-26 SURGERY — CARPAL TUNNEL RELEASE
Anesthesia: Monitor Anesthesia Care | Site: Wrist | Laterality: Left | Wound class: Clean

## 2012-05-26 MED ORDER — METHYLPREDNISOLONE ACETATE 40 MG/ML IJ SUSP
INTRAMUSCULAR | Status: DC | PRN
  Administered 2012-05-26: 40 mg via INTRA_ARTICULAR

## 2012-05-26 MED ORDER — ONDANSETRON HCL 4 MG/2ML IJ SOLN
INTRAMUSCULAR | Status: DC | PRN
  Administered 2012-05-26: 4 mg via INTRAVENOUS

## 2012-05-26 MED ORDER — BUPIVACAINE-EPINEPHRINE PF 0.5-1:200000 % IJ SOLN
INTRAMUSCULAR | Status: DC | PRN
  Administered 2012-05-26: 30 mL

## 2012-05-26 MED ORDER — LIDOCAINE HCL 2 % IJ SOLN
INTRAMUSCULAR | Status: DC | PRN
  Administered 2012-05-26: 1 mL

## 2012-05-26 MED ORDER — MIDAZOLAM HCL 2 MG/2ML IJ SOLN
0.5000 mg | INTRAMUSCULAR | Status: DC | PRN
  Administered 2012-05-26: 1 mg via INTRAVENOUS

## 2012-05-26 MED ORDER — PROPOFOL 10 MG/ML IV EMUL
INTRAVENOUS | Status: DC | PRN
  Administered 2012-05-26: 25 ug/kg/min via INTRAVENOUS

## 2012-05-26 MED ORDER — HYDROCODONE-ACETAMINOPHEN 5-325 MG PO TABS: ORAL_TABLET | ORAL | Status: AC

## 2012-05-26 MED ORDER — LACTATED RINGERS IV SOLN
INTRAVENOUS | Status: DC
  Administered 2012-05-26: 07:00:00 via INTRAVENOUS

## 2012-05-26 MED ORDER — LIDOCAINE HCL (CARDIAC) 20 MG/ML IV SOLN
INTRAVENOUS | Status: DC | PRN
  Administered 2012-05-26: 30 mg via INTRAVENOUS

## 2012-05-26 MED ORDER — FENTANYL CITRATE 0.05 MG/ML IJ SOLN
50.0000 ug | INTRAMUSCULAR | Status: DC | PRN
  Administered 2012-05-26: 50 ug via INTRAVENOUS

## 2012-05-26 MED ORDER — ROPIVACAINE HCL 5 MG/ML IJ SOLN
INTRAMUSCULAR | Status: DC | PRN
  Administered 2012-05-26: 10 mL via EPIDURAL

## 2012-05-26 MED ORDER — CHLORHEXIDINE GLUCONATE 4 % EX LIQD: 60.0000 mL | Freq: Once | CUTANEOUS | Status: DC

## 2012-05-26 SURGICAL SUPPLY — 49 items
BANDAGE ADHESIVE 1X3 (GAUZE/BANDAGES/DRESSINGS) IMPLANT
BANDAGE ELASTIC 3 VELCRO ST LF (GAUZE/BANDAGES/DRESSINGS) ×3 IMPLANT
BANDAGE ELASTIC 4 VELCRO ST LF (GAUZE/BANDAGES/DRESSINGS) ×1 IMPLANT
BLADE MINI RND TIP GREEN BEAV (BLADE) ×1 IMPLANT
BLADE SURG 15 STRL LF DISP TIS (BLADE) ×2 IMPLANT
BLADE SURG 15 STRL SS (BLADE) ×3
BNDG CMPR 9X4 STRL LF SNTH (GAUZE/BANDAGES/DRESSINGS)
BNDG ESMARK 4X9 LF (GAUZE/BANDAGES/DRESSINGS) IMPLANT
BRUSH SCRUB EZ PLAIN DRY (MISCELLANEOUS) ×3 IMPLANT
CLOTH BEACON ORANGE TIMEOUT ST (SAFETY) ×3 IMPLANT
CORDS BIPOLAR (ELECTRODE) ×3 IMPLANT
COVER MAYO STAND STRL (DRAPES) ×3 IMPLANT
COVER TABLE BACK 60X90 (DRAPES) ×3 IMPLANT
CUFF TOURNIQUET SINGLE 18IN (TOURNIQUET CUFF) ×1 IMPLANT
DECANTER SPIKE VIAL GLASS SM (MISCELLANEOUS) IMPLANT
DRAPE EXTREMITY T 121X128X90 (DRAPE) ×3 IMPLANT
DRAPE SURG 17X23 STRL (DRAPES) ×3 IMPLANT
DRSG TEGADERM 4X4.75 (GAUZE/BANDAGES/DRESSINGS) ×3 IMPLANT
GAUZE SPONGE 4X4 12PLY STRL LF (GAUZE/BANDAGES/DRESSINGS) ×3 IMPLANT
GLOVE BIO SURGEON STRL SZ 6.5 (GLOVE) ×1 IMPLANT
GLOVE BIOGEL M STRL SZ7.5 (GLOVE) ×3 IMPLANT
GLOVE ORTHO TXT STRL SZ7.5 (GLOVE) ×3 IMPLANT
GOWN BRE IMP PREV XXLGXLNG (GOWN DISPOSABLE) ×2 IMPLANT
GOWN PREVENTION PLUS XLARGE (GOWN DISPOSABLE) ×3 IMPLANT
LOOP VESSEL MAXI BLUE (MISCELLANEOUS) IMPLANT
NEEDLE 27GAX1X1/2 (NEEDLE) ×1 IMPLANT
PACK BASIN DAY SURGERY FS (CUSTOM PROCEDURE TRAY) ×3 IMPLANT
PAD CAST 3X4 CTTN HI CHSV (CAST SUPPLIES) ×2 IMPLANT
PADDING CAST ABS 4INX4YD NS (CAST SUPPLIES) ×1
PADDING CAST ABS COTTON 4X4 ST (CAST SUPPLIES) ×2 IMPLANT
PADDING CAST COTTON 3X4 STRL (CAST SUPPLIES) ×3
SLEEVE SCD COMPRESS KNEE MED (MISCELLANEOUS) ×1 IMPLANT
SLING ARM FOAM STRAP LRG (SOFTGOODS) ×1 IMPLANT
SPLINT PLASTER CAST XFAST 3X15 (CAST SUPPLIES) ×10 IMPLANT
SPLINT PLASTER XTRA FASTSET 3X (CAST SUPPLIES) ×5
SPONGE GAUZE 4X4 12PLY (GAUZE/BANDAGES/DRESSINGS) ×3 IMPLANT
STOCKINETTE 4X48 STRL (DRAPES) ×3 IMPLANT
STRIP CLOSURE SKIN 1/2X4 (GAUZE/BANDAGES/DRESSINGS) ×3 IMPLANT
SUT PROLENE 3 0 PS 2 (SUTURE) ×3 IMPLANT
SUT VIC AB 3-0 X1 27 (SUTURE) ×1 IMPLANT
SUT VIC AB 4-0 P-3 18XBRD (SUTURE) IMPLANT
SUT VIC AB 4-0 P3 18 (SUTURE)
SYR 3ML 23GX1 SAFETY (SYRINGE) IMPLANT
SYR BULB 3OZ (MISCELLANEOUS) IMPLANT
SYR CONTROL 10ML LL (SYRINGE) IMPLANT
TOWEL OR 17X24 6PK STRL BLUE (TOWEL DISPOSABLE) ×6 IMPLANT
TRAY DSU PREP LF (CUSTOM PROCEDURE TRAY) ×3 IMPLANT
UNDERPAD 30X30 INCONTINENT (UNDERPADS AND DIAPERS) ×3 IMPLANT
WATER STERILE IRR 1000ML POUR (IV SOLUTION) ×3 IMPLANT

## 2012-05-26 NOTE — Anesthesia Procedure Notes (Addendum)
Anesthesia Regional Block:  Supraclavicular block  Pre-Anesthetic Checklist: ,, timeout performed, Correct Patient, Correct Site, Correct Laterality, Correct Procedure, Correct Position, site marked, Risks and benefits discussed, pre-op evaluation, post-op pain management  Laterality: Left  Prep: Maximum Sterile Barrier Precautions used and chloraprep       Needles:  Injection technique: Single-shot  Needle Type: Echogenic Stimulator Needle      Needle Gauge: 22 and 22 G    Additional Needles:  Procedures: ultrasound guided and nerve stimulator Supraclavicular block Narrative:  Start time: 05/26/2012 7:16 AM End time: 05/26/2012 7:24 AM Injection made incrementally with aspirations every 5 mL. Anesthesiologist: Fitzgerald,MD  Additional Notes: 2% Lidocaine skin wheel. Intercostobrachial block with 10cc of 0.5% Ropivicaine.  Supraclavicular block Procedure Name: MAC Date/Time: 05/26/2012 7:52 AM Performed by: Tarek Cravens D Pre-anesthesia Checklist: Patient identified, Emergency Drugs available, Suction available, Patient being monitored and Timeout performed Patient Re-evaluated:Patient Re-evaluated prior to inductionOxygen Delivery Method: Simple face mask

## 2012-05-26 NOTE — Transfer of Care (Signed)
Immediate Anesthesia Transfer of Care Note  Patient: Christine Keller  Procedure(s) Performed: Procedure(s) (LRB): CARPAL TUNNEL RELEASE (Left) ULNAR NERVE DECOMPRESSION/TRANSPOSITION (Left)  Patient Location: PACU  Anesthesia Type: MAC combined with regional for post-op pain  Level of Consciousness: awake, alert , oriented and patient cooperative  Airway & Oxygen Therapy: Patient Spontanous Breathing and Patient connected to face mask oxygen  Post-op Assessment: Report given to PACU RN and Post -op Vital signs reviewed and stable  Post vital signs: Reviewed and stable  Complications: No apparent anesthesia complications

## 2012-05-26 NOTE — Progress Notes (Signed)
Assisted Dr. Fitzgerald with left, ultrasound guided, supraclavicular block. Side rails up, monitors on throughout procedure. See vital signs in flow sheet. Tolerated Procedure well. 

## 2012-05-26 NOTE — Anesthesia Preprocedure Evaluation (Addendum)
Anesthesia Evaluation  Patient identified by MRN, date of birth, ID band Patient awake    Reviewed: Allergy & Precautions, H&P , NPO status , Patient's Chart, lab work & pertinent test results  Airway Mallampati: II TM Distance: >3 FB Neck ROM: Full    Dental No notable dental hx. (+) Upper Dentures, Partial Lower and Dental Advisory Given   Pulmonary shortness of breath, COPDCurrent Smoker,  breath sounds clear to auscultation  Pulmonary exam normal       Cardiovascular hypertension, On Medications Rhythm:Regular Rate:Normal     Neuro/Psych PSYCHIATRIC DISORDERS negative neurological ROS     GI/Hepatic negative GI ROS, Neg liver ROS,   Endo/Other  negative endocrine ROS  Renal/GU negative Renal ROS  negative genitourinary   Musculoskeletal   Abdominal   Peds  Hematology negative hematology ROS (+)   Anesthesia Other Findings   Reproductive/Obstetrics negative OB ROS                          Anesthesia Physical Anesthesia Plan  ASA: III  Anesthesia Plan: MAC and Regional   Post-op Pain Management:    Induction: Intravenous  Airway Management Planned: Simple Face Mask  Additional Equipment:   Intra-op Plan:   Post-operative Plan:   Informed Consent: I have reviewed the patients History and Physical, chart, labs and discussed the procedure including the risks, benefits and alternatives for the proposed anesthesia with the patient or authorized representative who has indicated his/her understanding and acceptance.   Dental advisory given  Plan Discussed with: CRNA  Anesthesia Plan Comments:         Anesthesia Quick Evaluation

## 2012-05-26 NOTE — Anesthesia Postprocedure Evaluation (Signed)
  Anesthesia Post-op Note  Patient: Christine Keller  Procedure(s) Performed: Procedure(s) (LRB): CARPAL TUNNEL RELEASE (Left) ULNAR NERVE DECOMPRESSION/TRANSPOSITION (Left)  Patient Location: PACU  Anesthesia Type: MAC combined with regional for post-op pain  Level of Consciousness: awake, alert  and oriented  Airway and Oxygen Therapy: Patient Spontanous Breathing  Post-op Pain: none  Post-op Assessment: Post-op Vital signs reviewed, Patient's Cardiovascular Status Stable, Respiratory Function Stable, Patent Airway and No signs of Nausea or vomiting  Post-op Vital Signs: Reviewed and stable  Complications: No apparent anesthesia complications

## 2012-05-26 NOTE — Brief Op Note (Signed)
05/26/2012  8:24 AM  PATIENT:  Christine Keller  71 y.o. female  PRE-OPERATIVE DIAGNOSIS:  left carpal tunnel syndrome left cubital tunnel, Right thumb carpal-metacarpal joint degenerative joint disease  POST-OPERATIVE DIAGNOSIS:  left carpal tunnel syndrome left cubital tunnel, Right thumb carpal-metacarpal joint degenerative joint disease  PROCEDURE:  Procedure(s) (LRB):injection right thumb carpalmetacarpal joint CARPAL TUNNEL RELEASE (Left) ULNAR NERVE DECOMPRESSION (Left)  SURGEON:  Surgeon(s) and Role:    * Wyn Forster., MD - Primary  PHYSICIAN ASSISTANT:   ASSISTANT: Mallory Shirk.A-C   ANESTHESIA:   regional  EBL:  Total I/O In: 700 [I.V.:700] Out: -   BLOOD ADMINISTERED:none  DRAINS: none   LOCAL MEDICATIONS USED: ropivicaine supraclavicular block  SPECIMEN:  No Specimen  DISPOSITION OF SPECIMEN:  N/A  COUNTS:  YES  TOURNIQUET:   Total Tourniquet Time Documented: Upper Arm (Left) - 15 minutes  DICTATION: .Other Dictation: Dictation Number [id number]  PLAN OF CARE: Discharge to home after PACU  PATIENT DISPOSITION:  PACU - hemodynamically stable.

## 2012-05-27 LAB — POCT HEMOGLOBIN-HEMACUE
Hemoglobin: 18.1 g/dL — ABNORMAL HIGH (ref 12.0–15.0)
Hemoglobin: 18.2 g/dL — ABNORMAL HIGH (ref 12.0–15.0)

## 2012-05-27 NOTE — Op Note (Signed)
NAME:  Christine Keller, Christine Keller NO.:  [id number]  MEDICAL RECORD NO.:  [id number]  LOCATION:  XRAY                          FACILITY:  [facility]  PHYSICIAN:  Christine Keller, M.D. DATE OF BIRTH:  30-Jan-1941  DATE OF PROCEDURE:  05/26/2012 DATE OF DISCHARGE:                              OPERATIVE REPORT   PREOPERATIVE DIAGNOSIS: 1. Chronic median entrapment neuropathy, left carpal tunnel. 2. Chronic left ulnar neuropathy at cubital tunnel. 3. Bilateral thumb carpometacarpal arthritis with disabling pain,     right thumb CMC joint.  POSTOPERATIVE DIAGNOSIS: 1. Chronic median entrapment neuropathy, left carpal tunnel. 2. Chronic left ulnar neuropathy at cubital tunnel. 3. Bilateral thumb carpometacarpal arthritis with disabling pain,     right thumb CMC joint.  OPERATION: 1. Release of left transverse carpal ligament. 2. In situ decompression of left ulnar nerve at cubital tunnel. 3. Injection of right thumb carpometacarpal joint.  OPERATING SURGEON:  Christine Fitch. Cristyn Crossno, MD.  ASSISTANT:  Christine Keller.  ANESTHESIA:  Supraclavicular block/sedation.  SUPERVISED ANESTHESIOLOGIST:  Christine Mayo, MD.  INDICATIONS:  Christine Keller is a 71 year old woman referred through the courtesy of [facility].  She has a history of significant hand numbness and discomfort at the base of her thumbs.  She was referred for electrodiagnostic studies in [place], which revealed very significant entrapment neuropathy of the ulnar nerves bilaterally and significant left carpal tunnel syndrome and borderline right carpal tunnel syndrome.  She had advanced carpometacarpal arthritis of the base of both thumbs with a type 1 Z collapse on the right.  We had detailed informed consent, regarding treatment options.  We recommended that Christine Keller's schedule decompression of her left carpal tunnel and in situ decompression of her left ulnar nerve at this  time in an effort to relieve her symptoms of numbness and dysesthesia and proceed with injection of her right thumb carpometacarpal joint while sedated.  Question regarding the anticipated procedure invited and answered in detail.  She was interviewed preoperatively by 5th by Christine Keller of Anesthesia and after informed consent, in view of her chronic obstructive airways disease is provided a supraclavicular block with ultrasound guidance.  This was well tolerated, leading to excellent anesthesia of the left arm.  PROCEDURE:  Christine Keller was transferred to room 2 of the [facility] and placed in supine position on the operating table.  Under Christine Keller direct supervision, IV sedation was provided.  The left arm was prepped with Betadine soap and solution, sterilely draped.  A pneumatic tourniquet was applied to the proximal left brachium. Following routine surgical time-out, we initially proceeded with treatment of the right thumb by prep of her skin over the carpometacarpal joint with alcohol and Betadine followed by injection of a 1.5 mL volume of 50% Depo-Medrol 40 mg/mL and 2% plain lidocaine. This was well tolerated with good joint capsule distention.  This wound was then cleaned with Betadine and dressed with a Band-Aid.  Attention was then directed to the left upper extremity.  The left hand and arm were exsanguinated with an Esmarch bandage, an arterial tourniquet on proximal left brachium inflated to 240 mmHg.  Procedure commenced with a short incision in the line of the ring finger in the palm.  Subcutaneous tissues were carefully divided and the palmar fascia.  This split longitudinally to the common sensory branch of median nerve.  These were followed back to the median nerve proper, which was gently isolated from the transcarpal ligament with Penfield 4 elevator.  Scissors were used to release transverse carpal ligament along its ulnar border extending  into the distal forearm.  This widely opened carpal canal.  No mass or other predicaments were noted. Bleeding points along the margin of the released ligament were electrocauterized bipolar current followed by repair of the skin with intradermal 3-0 Prolene.  Attention was then directed to the elbow.  A 2- cm incision was fashioned directly posterior to the epicondyle.  [name] is very thin with virtually no subcutaneous fat.  The cubital groove was studied and found to be very shallow.  The ulnar nerve was clearly compressed at the entrance to the heads of flexor carpi ulnaris. The fascia Osborne band and the arcuate ligament were released with scissors.  The nerve was decompressed 6 cm above the epicondyle and 6 cm distal to the epicondyle.  By leaving the mesoneurium intact.  The nerve was noted to be stable.  The elbow was ranged 0-140 degrees of flexion.  There was some kinking of the nerve at the site of the prior compression but otherwise the nerve was fully free and gliding well.  The wound was repaired with subcutaneous 4-0 Vicryl and intradermal 3-0 Prolene with Steri-Strips.  [name] was placed in compressive dressings of sterile gauze, sterile Webril, and a volar plaster splint on the wrist and sterile gauze and a Tegaderm with Ace wrap at the elbow.  We will encourage immediate range of motion exercises.  For aftercare, she is provided a prescription for Vicodin 5/325 1 p.o. q.4-6 hours p.r.n. pain, 24 tablets without refill.  She confirmed that despite her codeine and Tylenol she is able to tolerate hydrocodone.    Christine Fitch Yailyn Strack, M.D.    RVS/MEDQ  D:  05/26/2012  T:  05/27/2012  Job:  [id number]  cc:   Barbette Hair. Artist Pais, DO

## 2012-05-31 ENCOUNTER — Encounter (HOSPITAL_BASED_OUTPATIENT_CLINIC_OR_DEPARTMENT_OTHER): Payer: Self-pay | Admitting: Orthopedic Surgery

## 2012-06-16 ENCOUNTER — Ambulatory Visit (INDEPENDENT_AMBULATORY_CARE_PROVIDER_SITE_OTHER): Payer: Medicare Other

## 2012-06-16 DIAGNOSIS — Z23 Encounter for immunization: Secondary | ICD-10-CM

## 2012-07-19 ENCOUNTER — Encounter: Payer: Self-pay | Admitting: Internal Medicine

## 2012-07-19 ENCOUNTER — Ambulatory Visit (INDEPENDENT_AMBULATORY_CARE_PROVIDER_SITE_OTHER): Payer: Medicare Other | Admitting: Internal Medicine

## 2012-07-19 VITALS — BP 156/84 | HR 64 | Temp 97.9°F | Wt 130.0 lb

## 2012-07-19 DIAGNOSIS — Z23 Encounter for immunization: Secondary | ICD-10-CM

## 2012-07-19 DIAGNOSIS — I10 Essential (primary) hypertension: Secondary | ICD-10-CM

## 2012-07-19 NOTE — Assessment & Plan Note (Addendum)
Her blood pressure is still suboptimally controlled. She did not increase amlodipine as directed. Increase dose to 10 mg. Reassess in 3 months.  Patient will contact our office if she experiences hypotension.  Continue to monitor BP at home.

## 2012-07-19 NOTE — Patient Instructions (Signed)
Continue to monitor your blood pressure at home Please complete the following lab tests before your next follow up appointment: BMET - [phone]

## 2012-07-19 NOTE — Progress Notes (Signed)
Subjective:    Patient ID: Christine Keller, female    DOB: 09/16/1941, 71 y.o.   MRN: [id number]  HPI  71 year old white female for follow up for hypertension. Patient did not increase her amlodipine dose as directed. She was concerned that her blood pressure might drop during her carpal tunnel release surgery.  She has been monitoring her blood pressure at home. Her systolic blood pressure readings are in the high 130s to low 140s.  Hirsutism-she has yet to meet with the endocrinologist.  Review of Systems Negative for chest pain  Past Medical History  Diagnosis Date  . Hyperlipidemia   . Hypertension   . Thyroid nodule   . Tobacco abuse   . COPD (chronic obstructive pulmonary disease) 1.31.11    PFT FEV1 1.19(56%), FVC 2.68(90%), FEV1% 44, TLC 5.80(115%), DLCO 52%  . Osteoporosis     OSTEOPENIA  . Bilateral carpal tunnel syndrome   . Wears dentures     upper-partial bottom  . Wears glasses     History   Social History  . Marital Status: Married    Spouse Name: N/A    Number of Children: N/A  . Years of Education: N/A   Occupational History  . Retired     Diplomatic Services operational officer   Social History Main Topics  . Smoking status: Current Every Day Smoker -- 1.0 packs/day    Types: Cigarettes  . Smokeless tobacco: Not on file   Comment: Less than 1 ppd.  . Alcohol Use: Yes     wine daily  . Drug Use: No  . Sexually Active: Not on file   Other Topics Concern  . Not on file   Social History Narrative   Regular exercise:  3 x weeklyCaffeine Use:  occasional    Past Surgical History  Procedure Date  . Tonsillectomy   . Carpal tunnel release 05/26/2012    Procedure: CARPAL TUNNEL RELEASE;  Surgeon: Wyn Forster., MD;  Location: [facility];  Service: Orthopedics;  Laterality: Left;  Injection of Right thumb Carpal-Metacarpal joint also  . Ulnar nerve transposition 05/26/2012    Procedure: ULNAR NERVE DECOMPRESSION/TRANSPOSITION;  Surgeon: Wyn Forster.,  MD;  Location: [facility];  Service: Orthopedics;  Laterality: Left;    Family History  Problem Relation Age of Onset  . Heart disease Mother   . Hypertension Mother   . Asthma Father   . Emphysema Father   . Heart disease Brother     Allergies  Allergen Reactions  . Codeine     REACTION: hallucinations  . Niacin     REACTION: rash    Current Outpatient Prescriptions on File Prior to Visit  Medication Sig Dispense Refill  . amLODipine (NORVASC) 10 MG tablet Take 1 tablet (10 mg total) by mouth daily.  90 tablet  1  . aspirin 81 MG tablet Take 81 mg by mouth daily.        . Calcium 600-200 MG-UNIT per tablet Take 2 tablets by mouth daily.        . Coenzyme Q10 (COQ10) 200 MG CAPS Take 1 capsule by mouth daily.        . Garlic Oil (ODORLESS GARLIC) 500 MG TABS Take 1 tablet by mouth daily.        Chilton Si Tea 315 MG CAPS Take 1 capsule by mouth daily.        . Multiple Vitamin (MULTIVITAMIN) tablet Take 1 tablet by mouth daily.        Marland Kitchen  OMEGA 3 1000 MG CAPS Take 1 capsule by mouth 2 (two) times daily.        . simvastatin (ZOCOR) 20 MG tablet Take 0.5 tablets (10 mg total) by mouth daily.  90 tablet  1  . Thiamine HCl (VITAMIN B-1) 100 MG tablet Take 100 mg by mouth daily.        Marland Kitchen tiotropium (SPIRIVA HANDIHALER) 18 MCG inhalation capsule Place 1 capsule (18 mcg total) into inhaler and inhale daily.  90 capsule  3  . valsartan-hydrochlorothiazide (DIOVAN-HCT) 160-25 MG per tablet Take 1 tablet by mouth daily.  90 tablet  3  . Vitamin Mixture (ESTER-C) 500-60 MG TABS Take 1 tablet by mouth daily.          BP 156/84  Pulse 64  Temp 97.9 F (36.6 C) (Oral)  Wt 130 lb (58.968 kg)       Objective:   Physical Exam  Constitutional: She is oriented to person, place, and time. She appears well-developed and well-nourished.  Cardiovascular: Normal rate, regular rhythm and normal heart sounds.   Pulmonary/Chest: Effort normal. She has no wheezes.  Musculoskeletal:  She exhibits no edema.  Neurological: She is alert and oriented to person, place, and time.          Assessment & Plan:

## 2012-08-11 ENCOUNTER — Other Ambulatory Visit: Payer: Self-pay | Admitting: Orthopedic Surgery

## 2012-08-15 ENCOUNTER — Encounter: Payer: Self-pay | Admitting: Internal Medicine

## 2012-08-15 NOTE — H&P (Signed)
Christine Keller is an 71 y.o. female.   Chief Complaint: c/o chronic and progressive numbness and tingling of the right hand   Christine Keller is a 71 y/o female who has a history of bilateral hand and arm discomfort. She has had detailed electrodiagnostic studies completed by Dr. Anne Hahn in August of 2012. These revealed evidence of bilateral carpal tunnel syndrome, left worse than right, bilateral ulnar neuropathy at the elbows, left worse than right. She previously underwent left CTR and decompression left ulnar nerve 05/26/12 and did well in the post-op period and now wishes to proceed with right hand surgery.  Past Medical History  Diagnosis Date  . Hyperlipidemia   . Hypertension   . Thyroid nodule   . Tobacco abuse   . COPD (chronic obstructive pulmonary disease) 1.31.11    PFT FEV1 1.19(56%), FVC 2.68(90%), FEV1% 44, TLC 5.80(115%), DLCO 52%  . Osteoporosis     OSTEOPENIA  . Bilateral carpal tunnel syndrome   . Wears dentures     upper-partial bottom  . Wears glasses     Past Surgical History  Procedure Date  . Tonsillectomy   . Carpal tunnel release 05/26/2012    Procedure: CARPAL TUNNEL RELEASE;  Surgeon: Wyn Forster., MD;  Location: [facility];  Service: Orthopedics;  Laterality: Left;  Injection of Right thumb Carpal-Metacarpal joint also  . Ulnar nerve transposition 05/26/2012    Procedure: ULNAR NERVE DECOMPRESSION/TRANSPOSITION;  Surgeon: Wyn Forster., MD;  Location: [facility];  Service: Orthopedics;  Laterality: Left;    Family History  Problem Relation Age of Onset  . Heart disease Mother   . Hypertension Mother   . Asthma Father   . Emphysema Father   . Heart disease Brother    Social History:  reports that she has been smoking Cigarettes.  She has been smoking about 1 pack per day. She does not have any smokeless tobacco history on file. She reports that she drinks alcohol. She reports that she does not use illicit  drugs.  Allergies:  Allergies  Allergen Reactions  . Codeine     REACTION: hallucinations  . Niacin     REACTION: rash    No prescriptions prior to admission    No results found for this or any previous visit (from the past 48 hour(s)).  No results found.   Pertinent items are noted in HPI.  Height 5\' 4"  (1.626 m), weight 58.968 kg (130 lb).  General appearance: alert Head: Normocephalic, without obvious abnormality Neck: supple, symmetrical, trachea midline Resp: clear to auscultation bilaterally Cardio: regular rate and rhythm GI: normal findings: bowel sounds normal Extremities:. Inspection of her hands reveals no muscle atrophy. She has a type I Z-collapse of her right thumb and hypertrophic osteoarthritis at the [facility] joint. She does not have bone spur deformity of the left thumb CMC joint. She has full ROM of her fingers in flexion/extension. There is no sign of stenosing tenosynovitis. Her pulses and capillary refill are intact. She has diminished sensibility in the median distribution bilaterally. She has irritability of the ulnar nerve. She reports that she has numbness at night in all fingers of the right hand. The electrodiagnostic studies performed by Dr. Anne Hahn in August of 2012 were reviewed. Dr. Anne Hahn notes moderate left and mild right median neuropathy at wrist level and bilateral ulnar neuropathy at the elbows. Reviewing Dr. 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  Plan:To the OR for right CTR and injection left thumb CMC joint.The procedure, risks,benefits and post-op course were discussed with the patient at length and they were in agreement with the plan.   DASNOIT,Thressa Shiffer J 08/15/2012, 1:19 PM  H&P documentation:  08/16/2012  -History and Physical Reviewed  -Patient has been re-examined  -No change in the plan of care  Wyn Forster, MD

## 2012-08-15 NOTE — Progress Notes (Signed)
Pt here for lt ctr-did well Needs istat-ekg 8/13

## 2012-08-16 ENCOUNTER — Encounter (HOSPITAL_BASED_OUTPATIENT_CLINIC_OR_DEPARTMENT_OTHER): Payer: Self-pay | Admitting: Anesthesiology

## 2012-08-16 ENCOUNTER — Ambulatory Visit (HOSPITAL_BASED_OUTPATIENT_CLINIC_OR_DEPARTMENT_OTHER): Payer: Medicare Other | Admitting: Anesthesiology

## 2012-08-16 ENCOUNTER — Encounter (HOSPITAL_BASED_OUTPATIENT_CLINIC_OR_DEPARTMENT_OTHER): Payer: Self-pay | Admitting: *Deleted

## 2012-08-16 ENCOUNTER — Ambulatory Visit (HOSPITAL_BASED_OUTPATIENT_CLINIC_OR_DEPARTMENT_OTHER)
Admission: RE | Admit: 2012-08-16 | Discharge: 2012-08-16 | Disposition: A | Discharge status: Home / Self Care | Payer: Medicare Other | Source: Ambulatory Visit | Attending: Orthopedic Surgery | Admitting: Orthopedic Surgery

## 2012-08-16 ENCOUNTER — Encounter (HOSPITAL_BASED_OUTPATIENT_CLINIC_OR_DEPARTMENT_OTHER)
Admission: RE | Disposition: A | Discharge status: Home / Self Care | Payer: Self-pay | Source: Ambulatory Visit | Attending: Orthopedic Surgery

## 2012-08-16 DIAGNOSIS — M810 Age-related osteoporosis without current pathological fracture: Secondary | ICD-10-CM | POA: Insufficient documentation

## 2012-08-16 DIAGNOSIS — I10 Essential (primary) hypertension: Secondary | ICD-10-CM | POA: Insufficient documentation

## 2012-08-16 DIAGNOSIS — G5600 Carpal tunnel syndrome, unspecified upper limb: Secondary | ICD-10-CM | POA: Insufficient documentation

## 2012-08-16 DIAGNOSIS — Z01812 Encounter for preprocedural laboratory examination: Secondary | ICD-10-CM | POA: Insufficient documentation

## 2012-08-16 DIAGNOSIS — E785 Hyperlipidemia, unspecified: Secondary | ICD-10-CM | POA: Insufficient documentation

## 2012-08-16 DIAGNOSIS — F17200 Nicotine dependence, unspecified, uncomplicated: Secondary | ICD-10-CM | POA: Insufficient documentation

## 2012-08-16 HISTORY — PX: CARPAL TUNNEL RELEASE: SHX101

## 2012-08-16 HISTORY — PX: STERIOD INJECTION: SHX5046

## 2012-08-16 LAB — POCT I-STAT, CHEM 8
BUN: 10 mg/dL (ref 6–23)
Calcium, Ion: 1.18 mmol/L (ref 1.13–1.30)
Chloride: 105 mEq/L (ref 96–112)
Creatinine, Ser: 1.1 mg/dL (ref 0.50–1.10)
Glucose, Bld: 114 mg/dL — ABNORMAL HIGH (ref 70–99)
HCT: 47 % — ABNORMAL HIGH (ref 36.0–46.0)
Hemoglobin: 16 g/dL — ABNORMAL HIGH (ref 12.0–15.0)
Potassium: 3.4 mEq/L — ABNORMAL LOW (ref 3.5–5.1)
Sodium: 142 mEq/L (ref 135–145)
TCO2: 25 mmol/L (ref 0–100)

## 2012-08-16 SURGERY — CARPAL TUNNEL RELEASE
Anesthesia: Choice | Site: Finger | Laterality: Right

## 2012-08-16 MED ORDER — LIDOCAINE HCL (CARDIAC) 20 MG/ML IV SOLN
INTRAVENOUS | Status: DC | PRN
  Administered 2012-08-16: 50 mg via INTRAVENOUS

## 2012-08-16 MED ORDER — CHLORHEXIDINE GLUCONATE 4 % EX LIQD: 60.0000 mL | Freq: Once | CUTANEOUS | Status: DC

## 2012-08-16 MED ORDER — FENTANYL CITRATE 0.05 MG/ML IJ SOLN
INTRAMUSCULAR | Status: DC | PRN
  Administered 2012-08-16: 25 ug via INTRAVENOUS
  Administered 2012-08-16: 50 ug via INTRAVENOUS

## 2012-08-16 MED ORDER — LACTATED RINGERS IV SOLN
INTRAVENOUS | Status: DC
Start: 2012-08-16 — End: 2012-08-16
  Administered 2012-08-16: 07:00:00 via INTRAVENOUS

## 2012-08-16 MED ORDER — METHYLPREDNISOLONE ACETATE 40 MG/ML IJ SUSP
INTRAMUSCULAR | Status: DC | PRN
  Administered 2012-08-16: .5 mL via INTRA_ARTICULAR

## 2012-08-16 MED ORDER — LIDOCAINE HCL 2 % IJ SOLN
INTRAMUSCULAR | Status: DC | PRN
  Administered 2012-08-16: .5 mL
  Administered 2012-08-16: 4.5 mL

## 2012-08-16 MED ORDER — ONDANSETRON HCL 4 MG/2ML IJ SOLN: 4.0000 mg | Freq: Four times a day (QID) | INTRAMUSCULAR | Status: DC | PRN

## 2012-08-16 MED ORDER — DEXAMETHASONE SODIUM PHOSPHATE 4 MG/ML IJ SOLN
INTRAMUSCULAR | Status: DC | PRN
  Administered 2012-08-16: 10 mg via INTRAVENOUS

## 2012-08-16 MED ORDER — OXYCODONE HCL 5 MG PO TABS: 5.0000 mg | ORAL_TABLET | Freq: Once | ORAL | Status: DC | PRN

## 2012-08-16 MED ORDER — FENTANYL CITRATE 0.05 MG/ML IJ SOLN: 25.0000 ug | INTRAMUSCULAR | Status: DC | PRN

## 2012-08-16 MED ORDER — PROPOFOL 10 MG/ML IV EMUL
INTRAVENOUS | Status: DC | PRN
  Administered 2012-08-16: 50 ug/kg/min via INTRAVENOUS

## 2012-08-16 MED ORDER — OXYCODONE HCL 5 MG/5ML PO SOLN: 5.0000 mg | Freq: Once | ORAL | Status: DC | PRN

## 2012-08-16 MED ORDER — ONDANSETRON HCL 4 MG/2ML IJ SOLN
INTRAMUSCULAR | Status: DC | PRN
  Administered 2012-08-16: 4 mg via INTRAVENOUS

## 2012-08-16 SURGICAL SUPPLY — 40 items
BANDAGE ADHESIVE 1X3 (GAUZE/BANDAGES/DRESSINGS) ×1 IMPLANT
BANDAGE ELASTIC 3 VELCRO ST LF (GAUZE/BANDAGES/DRESSINGS) ×3 IMPLANT
BLADE SURG 15 STRL LF DISP TIS (BLADE) ×2 IMPLANT
BLADE SURG 15 STRL SS (BLADE) ×3
BNDG CMPR 9X4 STRL LF SNTH (GAUZE/BANDAGES/DRESSINGS)
BNDG ESMARK 4X9 LF (GAUZE/BANDAGES/DRESSINGS) IMPLANT
BRUSH SCRUB EZ PLAIN DRY (MISCELLANEOUS) ×3 IMPLANT
CLOTH BEACON ORANGE TIMEOUT ST (SAFETY) ×3 IMPLANT
CORDS BIPOLAR (ELECTRODE) IMPLANT
COVER MAYO STAND STRL (DRAPES) ×3 IMPLANT
COVER TABLE BACK 60X90 (DRAPES) ×3 IMPLANT
CUFF TOURNIQUET SINGLE 18IN (TOURNIQUET CUFF) ×1 IMPLANT
DECANTER SPIKE VIAL GLASS SM (MISCELLANEOUS) ×1 IMPLANT
DRAPE EXTREMITY T 121X128X90 (DRAPE) ×3 IMPLANT
DRAPE SURG 17X23 STRL (DRAPES) ×3 IMPLANT
GLOVE BIO SURGEON STRL SZ 6.5 (GLOVE) ×1 IMPLANT
GLOVE BIOGEL M STRL SZ7.5 (GLOVE) ×3 IMPLANT
GLOVE BIOGEL PI IND STRL 7.0 (GLOVE) IMPLANT
GLOVE BIOGEL PI INDICATOR 7.0 (GLOVE) ×1
GLOVE EXAM NITRILE MD LF STRL (GLOVE) ×1 IMPLANT
GLOVE ORTHO TXT STRL SZ7.5 (GLOVE) ×3 IMPLANT
GOWN PREVENTION PLUS XLARGE (GOWN DISPOSABLE) ×3 IMPLANT
GOWN PREVENTION PLUS XXLARGE (GOWN DISPOSABLE) ×6 IMPLANT
NEEDLE 27GAX1X1/2 (NEEDLE) IMPLANT
PACK BASIN DAY SURGERY FS (CUSTOM PROCEDURE TRAY) ×3 IMPLANT
PAD CAST 3X4 CTTN HI CHSV (CAST SUPPLIES) ×2 IMPLANT
PADDING CAST ABS 4INX4YD NS (CAST SUPPLIES) ×1
PADDING CAST ABS COTTON 4X4 ST (CAST SUPPLIES) ×2 IMPLANT
PADDING CAST COTTON 3X4 STRL (CAST SUPPLIES) ×3
SPLINT PLASTER CAST XFAST 3X15 (CAST SUPPLIES) ×10 IMPLANT
SPLINT PLASTER XTRA FASTSET 3X (CAST SUPPLIES) ×5
SPONGE GAUZE 4X4 12PLY (GAUZE/BANDAGES/DRESSINGS) ×3 IMPLANT
STOCKINETTE 4X48 STRL (DRAPES) ×3 IMPLANT
STRIP CLOSURE SKIN 1/2X4 (GAUZE/BANDAGES/DRESSINGS) ×3 IMPLANT
SUT PROLENE 3 0 PS 2 (SUTURE) ×3 IMPLANT
SYR 3ML 23GX1 SAFETY (SYRINGE) ×1 IMPLANT
SYR CONTROL 10ML LL (SYRINGE) IMPLANT
TRAY DSU PREP LF (CUSTOM PROCEDURE TRAY) ×3 IMPLANT
UNDERPAD 30X30 INCONTINENT (UNDERPADS AND DIAPERS) ×3 IMPLANT
WATER STERILE IRR 1000ML POUR (IV SOLUTION) ×2 IMPLANT

## 2012-08-16 NOTE — Anesthesia Postprocedure Evaluation (Signed)
  Anesthesia Post Note  Patient: Christine Keller  Procedure(s) Performed: Procedure(s) (LRB): CARPAL TUNNEL RELEASE (Right) STEROID INJECTION (Left)  Anesthesia type: MAC  Patient location: PACU  Post pain: Pain level controlled and Adequate analgesia  Post assessment: Post-op Vital signs reviewed, Patient's Cardiovascular Status Stable and Respiratory Function Stable  Last Vitals:  Filed Vitals:   08/16/12 0845  BP: 149/66  Pulse: 73  Temp:   Resp: 16    Post vital signs: Reviewed and stable  Level of consciousness: awake, alert  and oriented  Complications: No apparent anesthesia complications

## 2012-08-16 NOTE — Transfer of Care (Signed)
Immediate Anesthesia Transfer of Care Note  Patient: Christine Keller  Procedure(s) Performed: Procedure(s) (LRB) with comments: CARPAL TUNNEL RELEASE (Right) - INJECT LEFT THUMB CMC JOINT STEROID INJECTION (Left) - INJECT LEFT THUMB [facility] JOINT  Patient Location: PACU  Anesthesia Type:MAC  Level of Consciousness: awake, alert  and oriented  Airway & Oxygen Therapy: Patient Spontanous Breathing and Patient connected to face mask oxygen  Post-op Assessment: Report given to PACU RN and Post -op Vital signs reviewed and stable  Post vital signs: Reviewed and stable  Complications: No apparent anesthesia complications

## 2012-08-16 NOTE — Anesthesia Procedure Notes (Signed)
Procedure Name: MAC Performed by: Parish Augustine W Pre-anesthesia Checklist: Patient identified, Timeout performed, Emergency Drugs available, Suction available and Patient being monitored Patient Re-evaluated:Patient Re-evaluated prior to inductionOxygen Delivery Method: Simple face mask Placement Confirmation: positive ETCO2     

## 2012-08-16 NOTE — Brief Op Note (Signed)
08/16/2012  8:13 AM  PATIENT:  Christine Keller  71 y.o. female  PRE-OPERATIVE DIAGNOSIS:  RIGHT CARPAL TUNNEL SYNDROME, LEFT THUMB CMC ARTHROSIS  POST-OPERATIVE DIAGNOSIS: RIGHT CARPAL TUNNEL SYNDROME, LEFT THUMB CMC ARTHROSIS  PROCEDURE:  Procedure(s) (LRB) with comments: CARPAL TUNNEL RELEASE (Right) - INJECT LEFT THUMB CMC JOINT STEROID INJECTION (Left) - INJECT LEFT THUMB CMC JOINT  SURGEON:  Surgeon(s) and Role:    * Wyn Forster., MD - Primary  PHYSICIAN ASSISTANT:   ASSISTANTS: Mallory Shirk.A-C   ANESTHESIA:     EBL:     BLOOD ADMINISTERED:none  DRAINS: none   LOCAL MEDICATIONS USED:  MARCAINE     SPECIMEN:  No Specimen  DISPOSITION OF SPECIMEN:  N/A  COUNTS:  YES  TOURNIQUET:   Total Tourniquet Time Documented: Upper Arm (Right) - 9 minutes  DICTATION: .Other Dictation: Dictation Number [id number]  PLAN OF CARE: Discharge to home after PACU  PATIENT DISPOSITION:  PACU - hemodynamically stable.

## 2012-08-16 NOTE — Anesthesia Preprocedure Evaluation (Addendum)
Anesthesia Evaluation  Patient identified by MRN, date of birth, ID band Patient awake    Reviewed: Allergy & Precautions, H&P , NPO status , Patient's Chart, lab work & pertinent test results  Airway Mallampati: II  Neck ROM: full    Dental   Pulmonary shortness of breath, COPDCurrent Smoker,          Cardiovascular hypertension,     Neuro/Psych  Neuromuscular disease    GI/Hepatic   Endo/Other    Renal/GU      Musculoskeletal   Abdominal   Peds  Hematology   Anesthesia Other Findings   Reproductive/Obstetrics                           Anesthesia Physical Anesthesia Plan  ASA: III  Anesthesia Plan: MAC   Post-op Pain Management:    Induction: Intravenous  Airway Management Planned: Simple Face Mask  Additional Equipment:   Intra-op Plan:   Post-operative Plan:   Informed Consent: I have reviewed the patients History and Physical, chart, labs and discussed the procedure including the risks, benefits and alternatives for the proposed anesthesia with the patient or authorized representative who has indicated his/her understanding and acceptance.     Plan Discussed with: CRNA and Surgeon  Anesthesia Plan Comments:        Anesthesia Quick Evaluation

## 2012-08-16 NOTE — Op Note (Signed)
[id number] 

## 2012-08-17 NOTE — Op Note (Signed)
NAME:  Christine, Keller NO.:  [id number]  MEDICAL RECORD NO.:  [id number]  LOCATION:                                 FACILITY:  PHYSICIAN:  Katy Fitch. Dazha Kempa, M.D. DATE OF BIRTH:  Nov 20, 1940  DATE OF PROCEDURE:  08/16/2012 DATE OF DISCHARGE:                              OPERATIVE REPORT   PREOPERATIVE DIAGNOSIS:  Chronic median entrapment neuropathy, right carpal tunnel.  Also chronic left thumb carpometacarpal arthritis.  POSTOPERATIVE DIAGNOSIS:  Chronic median entrapment neuropathy, right carpal tunnel.  Also chronic left thumb carpometacarpal arthritis.  OPERATION: 1. Release of right transverse carpal ligament. 2. Injection of left thumb carpometacarpal joint.  OPERATING SURGEON:  Katy Fitch. Traveion Ruddock, MD  ASSISTANT:  Marveen Reeks Dasnoit, PA-C  ANESTHESIA:  Lidocaine 2% field block of forearm, palm, and median nerve supplemented by IV sedation.  SUPERVISING ANESTHESIOLOGIST:  Achille Rich, MD  INDICATIONS:  Christine Keller is a 71 year old woman referred through courtesy of Dr. Thomos Lemons for evaluation and management of hand numbness and thumb discomfort.  She is status post decompression of her left median nerve and left ulnar nerve at the elbow and injection of right thumb CMC joint.  She now returns requesting right carpal tunnel release and left thumb CMC injection.  Her preoperative electrodiagnostic studies revealed mild slowing of the ulnar nerve across the right elbow.  Repeated clinical examination revealed no sign of clinical numbness.  Therefore despite her asymptomatic ulnar nerve slowing, we elected not to proceed with decompression of her right ulnar nerve at cubital tunnel.  Preoperatively, she was reminded of the risks and benefits of surgery. Questions were invited and answered in detail.  PROCEDURE:  Christine Keller was interviewed in the holding area and her proper surgical site identified per protocol with a marking pen.  She was  subsequently transferred to room 6 of [facility] where under Dr. Seward Meth supervision, IV sedation was provided.  The left thumb and the right volar distal forearm and palm were prepped with Betadine followed by infiltration of 2% lidocaine into the path of the intended incision and around the median nerve and the distal forearm on the right.  On the left after Betadine prep, application of 5 pounds traction, we injected the carpometacarpal joint with a mixture of Depo- Medrol 40 mg/mL and 2% plain lidocaine, total volume of 1 mL without complication.  This was dressed with a Band-Aid.  The right hand was then prepped with Betadine soap and solution, sterilely draped.  A pneumatic tourniquet was applied to the proximal right brachium.  Following exsanguination of right arm with Esmarch bandage, arterial tourniquet inflated to 240 mmHg.  The procedure commenced with a short incision in the line of the ring finger in the proximal palm.  Subcutaneous tissues were carefully divided revealing the palmar fascia.  This split longitudinally to reveal the common sensory branches of the median nerve. These were followed back to the median nerve proper which was gently isolated to the transverse carpal ligament with the aid of a Insurance risk surveyor.  Once a safe path was created below the ligament and above the ligament, ligament was released with scissors along its  ulnar border, directly inspecting the median nerve.  No mass or other predicaments were noted. Bleeding points along the margin of the released ligament were electrocauterized with bipolar current.  The wound was then repaired with intradermal 3-0 Prolene suture.  A compressive dressing was applied with a volar plaster splint maintaining the wrist at 15 degrees dorsiflexion.  For aftercare, [name] is provided prescription for hydrocodone for pain.  She states that she is allergic is CODEINE but has taken  Vicodin in the past without untoward consequences.  We will see her back for followup in our office in 1 week for dressing change, suture removal, and advancement to a postop exercise program.     Katy Fitch. Makaela Cando, M.D.     RVS/MEDQ  D:  08/16/2012  T:  08/16/2012  Job:  [id number]  cc:   Barbette Hair. Artist Pais, DO

## 2012-08-18 ENCOUNTER — Encounter (HOSPITAL_BASED_OUTPATIENT_CLINIC_OR_DEPARTMENT_OTHER): Payer: Self-pay | Admitting: Orthopedic Surgery

## 2012-09-15 ENCOUNTER — Encounter: Payer: Self-pay | Admitting: Internal Medicine

## 2012-09-16 ENCOUNTER — Other Ambulatory Visit: Payer: Self-pay | Admitting: Obstetrics and Gynecology

## 2012-09-16 ENCOUNTER — Other Ambulatory Visit (HOSPITAL_COMMUNITY)
Admission: RE | Admit: 2012-09-16 | Discharge: 2012-09-16 | Disposition: A | Discharge status: Home / Self Care | Payer: Medicare Other | Source: Ambulatory Visit | Attending: Obstetrics and Gynecology | Admitting: Obstetrics and Gynecology

## 2012-09-16 DIAGNOSIS — Z01419 Encounter for gynecological examination (general) (routine) without abnormal findings: Secondary | ICD-10-CM | POA: Insufficient documentation

## 2012-09-16 DIAGNOSIS — Z1151 Encounter for screening for human papillomavirus (HPV): Secondary | ICD-10-CM | POA: Insufficient documentation

## 2012-09-16 MED ORDER — AMLODIPINE BESYLATE 10 MG PO TABS: 10.0000 mg | ORAL_TABLET | Freq: Every day | ORAL | Status: DC

## 2012-09-16 MED ORDER — TIOTROPIUM BROMIDE MONOHYDRATE 18 MCG IN CAPS: 18.0000 ug | ORAL_CAPSULE | Freq: Every day | RESPIRATORY_TRACT | Status: DC

## 2012-09-16 MED ORDER — SIMVASTATIN 20 MG PO TABS: 10.0000 mg | ORAL_TABLET | Freq: Every day | ORAL | Status: DC

## 2012-09-16 MED ORDER — VALSARTAN-HYDROCHLOROTHIAZIDE 160-25 MG PO TABS: 1.0000 | ORAL_TABLET | Freq: Every day | ORAL | Status: DC

## 2012-09-26 ENCOUNTER — Other Ambulatory Visit: Payer: Self-pay | Admitting: Obstetrics and Gynecology

## 2012-09-26 DIAGNOSIS — IMO0002 Reserved for concepts with insufficient information to code with codable children: Secondary | ICD-10-CM

## 2012-10-01 ENCOUNTER — Other Ambulatory Visit: Payer: Medicare Other

## 2012-10-03 ENCOUNTER — Other Ambulatory Visit: Payer: Medicare Other

## 2012-10-05 DIAGNOSIS — D4959 Neoplasm of unspecified behavior of other genitourinary organ: Secondary | ICD-10-CM

## 2012-10-05 HISTORY — DX: Neoplasm of unspecified behavior of other genitourinary organ: D49.59

## 2012-10-10 ENCOUNTER — Other Ambulatory Visit (INDEPENDENT_AMBULATORY_CARE_PROVIDER_SITE_OTHER): Payer: Medicare Other

## 2012-10-10 DIAGNOSIS — I10 Essential (primary) hypertension: Secondary | ICD-10-CM

## 2012-10-10 LAB — BASIC METABOLIC PANEL
BUN: 14 mg/dL (ref 6–23)
CO2: 30 mEq/L (ref 19–32)
Calcium: 9.7 mg/dL (ref 8.4–10.5)
Chloride: 101 mEq/L (ref 96–112)
Creatinine, Ser: 0.9 mg/dL (ref 0.4–1.2)
GFR: 68.85 mL/min (ref >60.00)
Glucose, Bld: 93 mg/dL (ref 70–99)
Potassium: 3.4 mEq/L — ABNORMAL LOW (ref 3.5–5.1)
Sodium: 139 mEq/L (ref 135–145)

## 2012-10-11 ENCOUNTER — Other Ambulatory Visit: Payer: Self-pay | Admitting: *Deleted

## 2012-10-11 ENCOUNTER — Other Ambulatory Visit: Payer: Medicare Other

## 2012-10-11 MED ORDER — POTASSIUM CHLORIDE CRYS ER 20 MEQ PO TBCR: 20.0000 meq | EXTENDED_RELEASE_TABLET | Freq: Every day | ORAL | Status: DC

## 2012-10-18 ENCOUNTER — Encounter: Payer: Self-pay | Admitting: Internal Medicine

## 2012-10-18 ENCOUNTER — Ambulatory Visit (INDEPENDENT_AMBULATORY_CARE_PROVIDER_SITE_OTHER): Payer: Medicare Other | Admitting: Internal Medicine

## 2012-10-18 VITALS — BP 140/80 | HR 74 | Temp 97.8°F | Wt 129.5 lb

## 2012-10-18 DIAGNOSIS — D4959 Neoplasm of unspecified behavior of other genitourinary organ: Secondary | ICD-10-CM

## 2012-10-18 DIAGNOSIS — I10 Essential (primary) hypertension: Secondary | ICD-10-CM

## 2012-10-18 DIAGNOSIS — L680 Hirsutism: Secondary | ICD-10-CM

## 2012-10-18 NOTE — Progress Notes (Signed)
Subjective:    Patient ID: Christine Keller, female    DOB: May 14, 1941, 72 y.o.   MRN: [id number]  HPI  72 year old white female with history of COPD, hypertension and hirsutism for followup. She was referred to endocrinologist-Dr. Sharl Ma for further evaluation for hirsutism. Patient found to have left ovarian tumor.. Dr. Sharl Ma suspect this is source of elevated testosterone level. She was referred to gynecologist for oophorectomy. Gynecologist advised patient to consider complete abdominal hysterectomy. Patient was not comfortable with her recommendation.  Hypertension-stable  Review of Systems Negative for chest pain or shortness of breath  Past Medical History  Diagnosis Date  . Hyperlipidemia   . Hypertension   . Thyroid nodule   . Tobacco abuse   . COPD (chronic obstructive pulmonary disease) 1.31.11    PFT FEV1 1.19(56%), FVC 2.68(90%), FEV1% 44, TLC 5.80(115%), DLCO 52%  . Osteoporosis     OSTEOPENIA  . Bilateral carpal tunnel syndrome   . Wears dentures     upper-partial bottom  . Wears glasses     History   Social History  . Marital Status: Married    Spouse Name: N/A    Number of Children: N/A  . Years of Education: N/A   Occupational History  . Retired     Diplomatic Services operational officer   Social History Main Topics  . Smoking status: Current Every Day Smoker -- 1.0 packs/day    Types: Cigarettes  . Smokeless tobacco: Not on file     Comment: Less than 1 ppd.  . Alcohol Use: Yes     Comment: wine daily  . Drug Use: No  . Sexually Active: Not on file   Other Topics Concern  . Not on file   Social History Narrative   Regular exercise:  3 x weeklyCaffeine Use:  occasional    Past Surgical History  Procedure Date  . Tonsillectomy   . Carpal tunnel release 05/26/2012    Procedure: CARPAL TUNNEL RELEASE;  Surgeon: Wyn Forster., MD;  Location: [facility];  Service: Orthopedics;  Laterality: Left;  Injection of Right thumb Carpal-Metacarpal joint also  .  Ulnar nerve transposition 05/26/2012    Procedure: ULNAR NERVE DECOMPRESSION/TRANSPOSITION;  Surgeon: Wyn Forster., MD;  Location: [facility];  Service: Orthopedics;  Laterality: Left;  . Carpal tunnel release 08/16/2012    Procedure: CARPAL TUNNEL RELEASE;  Surgeon: Wyn Forster., MD;  Location: [facility];  Service: Orthopedics;  Laterality: Right;  Inject Left Thumb Carpal Metacarpal Joint  . Steriod injection 08/16/2012    Procedure: STEROID INJECTION;  Surgeon: Wyn Forster., MD;  Location: [facility];  Service: Orthopedics;  Laterality: Left;  Inject Left Thumb Carpal Metacarpal Joint    Family History  Problem Relation Age of Onset  . Heart disease Mother   . Hypertension Mother   . Asthma Father   . Emphysema Father   . Heart disease Brother     Allergies  Allergen Reactions  . Codeine     REACTION: hallucinations  . Niacin     REACTION: rash    Current Outpatient Prescriptions on File Prior to Visit  Medication Sig Dispense Refill  . amLODipine (NORVASC) 10 MG tablet Take 1 tablet (10 mg total) by mouth daily.  90 tablet  3  . aspirin 81 MG tablet Take 81 mg by mouth daily.        . Calcium 600-200 MG-UNIT per tablet Take 2 tablets by mouth  daily.        . Coenzyme Q10 (COQ10) 200 MG CAPS Take 1 capsule by mouth daily.        . Garlic Oil (ODORLESS GARLIC) 500 MG TABS Take 1 tablet by mouth daily.        Chilton Si Tea 315 MG CAPS Take 1 capsule by mouth daily.        . Multiple Vitamin (MULTIVITAMIN) tablet Take 1 tablet by mouth daily.        . OMEGA 3 1000 MG CAPS Take 1 capsule by mouth 2 (two) times daily.        . simvastatin (ZOCOR) 20 MG tablet Take 0.5 tablets (10 mg total) by mouth daily.  90 tablet  3  . Thiamine HCl (VITAMIN B-1) 100 MG tablet Take 100 mg by mouth daily.        Marland Kitchen tiotropium (SPIRIVA HANDIHALER) 18 MCG inhalation capsule Place 1 capsule (18 mcg total) into inhaler and inhale daily.   90 capsule  3  . valsartan-hydrochlorothiazide (DIOVAN-HCT) 160-25 MG per tablet Take 1 tablet by mouth daily.  90 tablet  3  . Vitamin Mixture (ESTER-C) 500-60 MG TABS Take 1 tablet by mouth daily.          BP 156/82  Pulse 74  Temp 97.8 F (36.6 C) (Oral)  Wt 129 lb 8 oz (58.741 kg)       Objective:   Physical Exam  Constitutional: She is oriented to person, place, and time. She appears well-developed and well-nourished.  Cardiovascular: Normal rate, regular rhythm and normal heart sounds.   Pulmonary/Chest: Effort normal and breath sounds normal. She has no wheezes.  Neurological: She is alert and oriented to person, place, and time.  Psychiatric: She has a normal mood and affect. Her behavior is normal.          Assessment & Plan:

## 2012-10-18 NOTE — Assessment & Plan Note (Signed)
Home blood pressure readings reviewed. Systolic blood pressure readings are usually between 120 and 130. Continue same dose of valsartan hydrochlorothiazide and amlodipine. She is having difficulty tolerating her potassium supplement. Patient advised discontinue potassium.  She will try drinking low sodium V8 juice once daily.  Check BMET in 1 month.

## 2012-10-18 NOTE — Patient Instructions (Signed)
BMET in one month, ROV in 3 mths

## 2012-10-18 NOTE — Assessment & Plan Note (Signed)
Patient found to have left ovarian tumor. It is thought this is source of elevated testosterone levels. She was seen by local gynecologist who recommended complete hysterectomy. She was uncomfortable with this recommendation. I suggest consultation with Dr. Stanford Breed.

## 2012-11-18 ENCOUNTER — Other Ambulatory Visit: Payer: Medicare Other

## 2012-11-23 ENCOUNTER — Encounter: Payer: Self-pay | Admitting: Internal Medicine

## 2012-12-14 HISTORY — PX: OTHER SURGICAL HISTORY: SHX169

## 2012-12-16 ENCOUNTER — Ambulatory Visit: Payer: Medicare Other | Admitting: Internal Medicine

## 2013-06-26 ENCOUNTER — Encounter: Payer: Self-pay | Admitting: Internal Medicine

## 2013-06-26 ENCOUNTER — Ambulatory Visit (INDEPENDENT_AMBULATORY_CARE_PROVIDER_SITE_OTHER): Payer: Medicare Other | Admitting: Internal Medicine

## 2013-06-26 VITALS — BP 144/70 | HR 80 | Temp 98.0°F | Ht 64.75 in | Wt 130.0 lb

## 2013-06-26 DIAGNOSIS — R05 Cough: Secondary | ICD-10-CM

## 2013-06-26 DIAGNOSIS — Z0000 Encounter for general adult medical examination without abnormal findings: Secondary | ICD-10-CM

## 2013-06-26 DIAGNOSIS — Z23 Encounter for immunization: Secondary | ICD-10-CM

## 2013-06-26 DIAGNOSIS — I10 Essential (primary) hypertension: Secondary | ICD-10-CM

## 2013-06-26 DIAGNOSIS — Z1211 Encounter for screening for malignant neoplasm of colon: Secondary | ICD-10-CM

## 2013-06-26 DIAGNOSIS — E785 Hyperlipidemia, unspecified: Secondary | ICD-10-CM

## 2013-06-26 DIAGNOSIS — F17200 Nicotine dependence, unspecified, uncomplicated: Secondary | ICD-10-CM

## 2013-06-26 DIAGNOSIS — R059 Cough, unspecified: Secondary | ICD-10-CM

## 2013-06-26 LAB — CBC WITH DIFFERENTIAL/PLATELET
Basophils Absolute: 0 10*3/uL (ref 0.0–0.1)
Basophils Relative: 0.1 % (ref 0.0–3.0)
Eosinophils Absolute: 0.1 10*3/uL (ref 0.0–0.7)
Eosinophils Relative: 1.6 % (ref 0.0–5.0)
HCT: 42 % (ref 36.0–46.0)
Hemoglobin: 14.4 g/dL (ref 12.0–15.0)
Lymphocytes Relative: 36.6 % (ref 12.0–46.0)
Lymphs Abs: 3.1 10*3/uL (ref 0.7–4.0)
MCHC: 34.3 g/dL (ref 30.0–36.0)
MCV: 96.5 fl (ref 78.0–100.0)
Monocytes Absolute: 0.6 10*3/uL (ref 0.1–1.0)
Monocytes Relative: 6.7 % (ref 3.0–12.0)
Neutro Abs: 4.7 10*3/uL (ref 1.4–7.7)
Neutrophils Relative %: 55 % (ref 43.0–77.0)
Platelets: 290 10*3/uL (ref 150.0–400.0)
RBC: 4.34 Mil/uL (ref 3.87–5.11)
RDW: 12.8 % (ref 11.5–14.6)
WBC: 8.6 10*3/uL (ref 4.5–10.5)

## 2013-06-26 LAB — HEPATIC FUNCTION PANEL
ALT: 22 U/L (ref 0–35)
AST: 27 U/L (ref 0–37)
Albumin: 4.3 g/dL (ref 3.5–5.2)
Alkaline Phosphatase: 60 U/L (ref 39–117)
Bilirubin, Direct: 0 mg/dL (ref 0.0–0.3)
Total Bilirubin: 0.5 mg/dL (ref 0.3–1.2)
Total Protein: 7.4 g/dL (ref 6.0–8.3)

## 2013-06-26 LAB — LIPID PANEL
Cholesterol: 198 mg/dL (ref 0–200)
HDL: 54.5 mg/dL (ref >39.00)
LDL Cholesterol: 111 mg/dL — ABNORMAL HIGH (ref 0–99)
Total CHOL/HDL Ratio: 4
Triglycerides: 165 mg/dL — ABNORMAL HIGH (ref 0.0–149.0)
VLDL: 33 mg/dL (ref 0.0–40.0)

## 2013-06-26 LAB — BASIC METABOLIC PANEL
BUN: 13 mg/dL (ref 6–23)
CO2: 29 mEq/L (ref 19–32)
Calcium: 10.1 mg/dL (ref 8.4–10.5)
Chloride: 102 mEq/L (ref 96–112)
Creatinine, Ser: 0.8 mg/dL (ref 0.4–1.2)
GFR: 79.25 mL/min (ref >60.00)
Glucose, Bld: 87 mg/dL (ref 70–99)
Potassium: 3.7 mEq/L (ref 3.5–5.1)
Sodium: 138 mEq/L (ref 135–145)

## 2013-06-26 LAB — TSH: TSH: 1.47 u[IU]/mL (ref 0.35–5.50)

## 2013-06-26 MED ORDER — SIMVASTATIN 20 MG PO TABS: 10.0000 mg | ORAL_TABLET | Freq: Every day | ORAL | Status: DC

## 2013-06-26 MED ORDER — TIOTROPIUM BROMIDE MONOHYDRATE 18 MCG IN CAPS: 18.0000 ug | ORAL_CAPSULE | Freq: Every day | RESPIRATORY_TRACT | Status: DC

## 2013-06-26 MED ORDER — AMLODIPINE BESYLATE 10 MG PO TABS: 10.0000 mg | ORAL_TABLET | Freq: Every day | ORAL | Status: DC

## 2013-06-26 MED ORDER — VALSARTAN-HYDROCHLOROTHIAZIDE 160-25 MG PO TABS: 1.0000 | ORAL_TABLET | Freq: Every day | ORAL | Status: DC

## 2013-06-26 NOTE — Patient Instructions (Addendum)
Take Align daily Take vitamin D 3 2000 units once daily Eat foods high in calcium Spinach. Kale. Okra. Collards. Soybeans. White beans. Some fish, like sardines, salmon, perch, and rainbow trout. Foods that are calcium-fortified, such as some orange juice, oatmeal, and breakfast cereal.

## 2013-06-26 NOTE — Assessment & Plan Note (Signed)
Reviewed adult health maintenance protocols.  Patient updated with influenza vaccine. Medicare questionnaire reviewed in detail. Arrange screening DEXA since oophorectomy. Patient urged to quit smoking but declines. Defer screening mammogram until next year. Her last colon cancer screening was in 2008 (flexible sigmoidoscopy). She declines referral for full colonoscopy. Arrange iFOB.  Perform surveillance spirometry considering ongoing tobacco use and COPD.

## 2013-06-26 NOTE — Assessment & Plan Note (Signed)
Patient counseled on smoking cessation.  Office spirometry shows moderate obstructive airway disease.  Lung age 72.  Continue Spiriva.  Patient does not want to quit smoking at this time.

## 2013-06-26 NOTE — Assessment & Plan Note (Signed)
Stable.  Home BP readings still within normal limits.  Continue same dose of valsartan hydrochlorothiazide and amlodipine. Patient discontinued potassium supplement.  She is drinking low sodium V8 juice daily. Monitor electrolytes and kidney function. Monitor LFTs while taking simvastatin. BP: 144/70 mmHg

## 2013-06-26 NOTE — Progress Notes (Signed)
Subjective:    Patient ID: Christine Keller, female    DOB: 1941/07/20, 72 y.o.   MRN: [id number]  HPI  72 year old white female with history of COPD, hypertension and steroid cell tumor right ovary for routine Medicare physical. Patient underwent laparoscopic bilateral oophorectomy in spring of 2014. 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 There is a Passenger transport manager for Cymbalta. Fallopian tubes and left ovaries are uninvolved. Benign uterine nodule. Pelvic washing showed one atypical cell groups not consistent in appearance to primary care. Patient states that 1 C2 steroid tumor of the ovary. She reports she had serum testosterone levels recently which returned to normal. Patient reports feeling much better. She has developed intermittent hot flashes since her nephrectomy. She has also noticed slight decrease in libido.  Reviewed Medicare physical questionnaire. No new specialist other than Dr. Stanford Breed who performed her oophorectomy. Her last eye exam was in 2013.  See attached sheet for specific answers to Medicare questionnaire. Patient denies any depressive symptoms no history of falls. She denies any memory loss.  Review of Systems     Past Medical History  Diagnosis Date  . Hyperlipidemia   . Hypertension   . Thyroid nodule   . Tobacco abuse   . COPD (chronic obstructive pulmonary disease) 1.31.11    PFT FEV1 1.19(56%), FVC 2.68(90%), FEV1% 44, TLC 5.80(115%), DLCO 52%  . Osteoporosis     OSTEOPENIA  . Bilateral carpal tunnel syndrome   . Wears dentures     upper-partial bottom  . Wears glasses     History   Social History  . Marital Status: Married    Spouse Name: N/A    Number of Children: N/A  . Years of Education: N/A   Occupational History  . Retired     Diplomatic Services operational officer   Social History Main Topics  . Smoking status: Current Every Day Smoker -- 1.00 packs/day    Types: Cigarettes  . Smokeless tobacco: Not on file     Comment: Less  than 1 ppd.  . Alcohol Use: Yes     Comment: wine daily  . Drug Use: No  . Sexual Activity: Not on file   Other Topics Concern  . Not on file   Social History Narrative   Regular exercise:  3 x weekly   Caffeine Use:  occasional    Past Surgical History  Procedure Laterality Date  . Tonsillectomy    . Carpal tunnel release  05/26/2012    Procedure: CARPAL TUNNEL RELEASE;  Surgeon: Wyn Forster., MD;  Location: [facility];  Service: Orthopedics;  Laterality: Left;  Injection of Right thumb Carpal-Metacarpal joint also  . Ulnar nerve transposition  05/26/2012    Procedure: ULNAR NERVE DECOMPRESSION/TRANSPOSITION;  Surgeon: Wyn Forster., MD;  Location: [facility];  Service: Orthopedics;  Laterality: Left;  . Carpal tunnel release  08/16/2012    Procedure: CARPAL TUNNEL RELEASE;  Surgeon: Wyn Forster., MD;  Location: [facility];  Service: Orthopedics;  Laterality: Right;  Inject Left Thumb Carpal Metacarpal Joint  . Steriod injection  08/16/2012    Procedure: STEROID INJECTION;  Surgeon: Wyn Forster., MD;  Location: [facility];  Service: Orthopedics;  Laterality: Left;  Inject Left Thumb Carpal Metacarpal Joint    Family History  Problem Relation Age of Onset  . Heart disease Mother   . Hypertension Mother   . Asthma Father   .  Emphysema Father   . Heart disease Brother     Allergies  Allergen Reactions  . Codeine     REACTION: hallucinations  . Niacin     REACTION: rash    Current Outpatient Prescriptions on File Prior to Visit  Medication Sig Dispense Refill  . aspirin 81 MG tablet Take 81 mg by mouth daily.        . Coenzyme Q10 (COQ10) 200 MG CAPS Take 1 capsule by mouth daily.        . Garlic Oil (ODORLESS GARLIC) 500 MG TABS Take 1 tablet by mouth daily.        Chilton Si Tea 315 MG CAPS Take 1 capsule by mouth daily.        . Multiple Vitamin (MULTIVITAMIN) tablet Take 1 tablet by  mouth daily.        . OMEGA 3 1000 MG CAPS Take 1 capsule by mouth 2 (two) times daily.        . Thiamine HCl (VITAMIN B-1) 100 MG tablet Take 100 mg by mouth daily.        . Vitamin Mixture (ESTER-C) 500-60 MG TABS Take 1 tablet by mouth daily.         No current facility-administered medications on file prior to visit.    BP 144/70  Pulse 80  Temp(Src) 98 F (36.7 C) (Oral)  Ht 5' 4.75" (1.645 m)  Wt 130 lb (58.968 kg)  BMI 21.79 kg/m2    Objective:   Physical Exam  Constitutional: She is oriented to person, place, and time. She appears well-developed and well-nourished. No distress.  HENT:  Head: Normocephalic and atraumatic.  Right Ear: External ear normal.  Left Ear: External ear normal.  Mouth/Throat: Oropharynx is clear and moist.  Eyes: Conjunctivae and EOM are normal. Pupils are equal, round, and reactive to light.  Neck: Neck supple.  No carotid bruit  Cardiovascular: Normal rate, regular rhythm and normal heart sounds.   No murmur heard. Pulmonary/Chest: Effort normal and breath sounds normal. She has no wheezes.  Abdominal: Soft. Bowel sounds are normal. There is no tenderness.  Musculoskeletal: Normal range of motion. She exhibits no edema.  Lymphadenopathy:    She has no cervical adenopathy.  Neurological: She is alert and oriented to person, place, and time. No cranial nerve deficit.  Skin: Skin is warm and dry.  Psychiatric: She has a normal mood and affect. Her behavior is normal.          Assessment & Plan:

## 2013-06-28 ENCOUNTER — Ambulatory Visit (INDEPENDENT_AMBULATORY_CARE_PROVIDER_SITE_OTHER)
Admission: RE | Admit: 2013-06-28 | Discharge: 2013-06-28 | Disposition: A | Discharge status: Home / Self Care | Payer: Medicare Other | Source: Ambulatory Visit | Attending: Internal Medicine | Admitting: Internal Medicine

## 2013-06-28 DIAGNOSIS — Z13820 Encounter for screening for osteoporosis: Secondary | ICD-10-CM

## 2013-06-28 DIAGNOSIS — Z0000 Encounter for general adult medical examination without abnormal findings: Secondary | ICD-10-CM

## 2013-07-03 ENCOUNTER — Other Ambulatory Visit: Payer: Medicare Other

## 2013-07-04 LAB — FECAL OCCULT BLOOD, IMMUNOCHEMICAL: Fecal Occult Bld: POSITIVE

## 2013-07-11 ENCOUNTER — Other Ambulatory Visit: Payer: Self-pay | Admitting: Internal Medicine

## 2013-07-11 ENCOUNTER — Ambulatory Visit: Payer: Medicare Other

## 2013-07-11 ENCOUNTER — Ambulatory Visit (INDEPENDENT_AMBULATORY_CARE_PROVIDER_SITE_OTHER): Payer: Medicare Other | Admitting: Internal Medicine

## 2013-07-11 DIAGNOSIS — R195 Other fecal abnormalities: Secondary | ICD-10-CM

## 2013-07-11 DIAGNOSIS — Z23 Encounter for immunization: Secondary | ICD-10-CM

## 2013-07-17 ENCOUNTER — Encounter: Payer: Self-pay | Admitting: Internal Medicine

## 2013-07-18 ENCOUNTER — Encounter: Payer: Self-pay | Admitting: Internal Medicine

## 2013-07-18 ENCOUNTER — Telehealth: Payer: Self-pay | Admitting: *Deleted

## 2013-07-18 NOTE — Telephone Encounter (Signed)
Responded via mychart

## 2013-07-20 ENCOUNTER — Encounter: Payer: Self-pay | Admitting: Internal Medicine

## 2013-07-21 ENCOUNTER — Ambulatory Visit (AMBULATORY_SURGERY_CENTER): Payer: Self-pay | Admitting: *Deleted

## 2013-07-21 VITALS — Ht 65.0 in | Wt 128.8 lb

## 2013-07-21 DIAGNOSIS — K921 Melena: Secondary | ICD-10-CM

## 2013-07-21 MED ORDER — MOVIPREP 100 G PO SOLR: ORAL | Status: DC

## 2013-07-21 NOTE — Progress Notes (Signed)
No allergies to eggs or soy. No problems with anesthesia.  

## 2013-07-25 ENCOUNTER — Encounter: Payer: Self-pay | Admitting: Internal Medicine

## 2013-08-02 ENCOUNTER — Encounter: Payer: Self-pay | Admitting: Internal Medicine

## 2013-08-02 ENCOUNTER — Ambulatory Visit (AMBULATORY_SURGERY_CENTER): Payer: Medicare Other | Admitting: Internal Medicine

## 2013-08-02 VITALS — BP 138/62 | HR 72 | Temp 98.4°F | Resp 17 | Ht 65.0 in | Wt 128.0 lb

## 2013-08-02 DIAGNOSIS — K921 Melena: Secondary | ICD-10-CM

## 2013-08-02 DIAGNOSIS — D126 Benign neoplasm of colon, unspecified: Secondary | ICD-10-CM

## 2013-08-02 MED ORDER — HYDROCORTISONE ACETATE 25 MG RE SUPP: 25.0000 mg | Freq: Every evening | RECTAL | Status: DC | PRN

## 2013-08-02 MED ORDER — SODIUM CHLORIDE 0.9 % IV SOLN: 500.0000 mL | INTRAVENOUS | Status: DC

## 2013-08-02 NOTE — Progress Notes (Signed)
Patient did not experience any of the following events: a burn prior to discharge; a fall within the facility; wrong site/side/patient/procedure/implant event; or a hospital transfer or hospital admission upon discharge from the facility. (G8907) Patient did not have preoperative order for IV antibiotic SSI prophylaxis. (G8918)  

## 2013-08-02 NOTE — Op Note (Signed)
 [facility] [address] [place], [address]   COLONOSCOPY PROCEDURE REPORT  PATIENT: Christine, Keller  MR#: [id number] BIRTHDATE: Feb 01, 1941 , 72  yrs. old GENDER: Female ENDOSCOPIST: Hart Carwin, MD REFERRED AO:[id number] Artist Pais, DO PROCEDURE DATE:  08/02/2013 PROCEDURE:   Colonoscopy with cold biopsy polypectomy and Colonoscopy with snare polypectomy First Screening Colonoscopy - Avg.  risk and is 50 yrs.  old or older Yes.  Prior Negative Screening - Now for repeat screening. N/A  History of Adenoma - Now for follow-up colonoscopy & has been > or = to 3 yrs.  N/A  Polyps Removed Today? Yes. ASA CLASS:   Class II INDICATIONS:Average risk patient for colon cancer, hematochezia, and heme positive stool on home test,. MEDICATIONS: MAC sedation, administered by CRNA and propofol (Diprivan) 400mg  IV  DESCRIPTION OF PROCEDURE:   After the risks benefits and alternatives of the procedure were thoroughly explained, informed consent was obtained.  A digital rectal exam revealed no abnormalities of the rectum.   The LB [id number]  endoscope was introduced through the anus and advanced to the cecum, which was identified by both the appendix and ileocecal valve. No adverse events experienced.   The quality of the prep was good, using MoviPrep  The instrument was then slowly withdrawn as the colon was fully examined.      COLON FINDINGS: Four polypoid shaped sessile polyps ranging between 3-21mm in size were found in the sigmoid colon.at 15 to 5 cm,  A polypectomy was performed with cold forceps x2  and with a cold snare. x 2 .  The resection was complete and the polyp tissue was completely retrieved.  Retroflexed views revealed no abnormalities. The time to cecum=9 minutes 30 seconds.  Withdrawal time=17 minutes 02 seconds.  The scope was withdrawn and the procedure completed.there were small internal hemorrhoids which were bleeding. There was no  prolapse COMPLICATIONS: There were no complications.  ENDOSCOPIC IMPRESSION: Four sessile polyps ranging between 3-67mm in size were found in the sigmoid colon; polypectomy was performed with cold forceps and with a cold snare first grade internal hemorrhoids, bleeding, or most likely cause of heme positive stool  RECOMMENDATIONS: 1.  Await pathology results 2.  High fiber diet 3.   Anusol HC supp hs 4. recall colonoscopy pending biopsies   eSigned:  Hart Carwin, MD 08/02/2013 12:39 PM   cc:   PATIENT NAME:  Christine, Keller MR#: [id number]

## 2013-08-02 NOTE — Patient Instructions (Signed)

## 2013-08-02 NOTE — Progress Notes (Signed)
Called to room to assist during endoscopic procedure.  Patient ID and intended procedure confirmed with present staff. Received instructions for my participation in the procedure from the performing physician. ewm 

## 2013-08-03 ENCOUNTER — Telehealth: Payer: Self-pay | Admitting: *Deleted

## 2013-08-03 NOTE — Telephone Encounter (Signed)
  Follow up Call-  Call back number 08/02/2013  Post procedure Call Back phone  # [phone]  Permission to leave phone message Yes     Patient questions:  Do you have a fever, pain , or abdominal swelling? no Pain Score  0 *  Have you tolerated food without any problems? yes  Have you been able to return to your normal activities? yes  Do you have any questions about your discharge instructions: Diet   no Medications  no Follow up visit  no  Do you have questions or concerns about your Care? no  Actions: * If pain score is 4 or above: No action needed, pain <4.

## 2013-08-08 ENCOUNTER — Encounter: Payer: Self-pay | Admitting: Internal Medicine

## 2013-08-30 ENCOUNTER — Encounter: Payer: Medicare Other | Admitting: Internal Medicine

## 2013-09-05 NOTE — Addendum Note (Signed)
Addended by: Maple Hudson on: 09/05/2013 12:33 PM   Modules accepted: Level of Service

## 2013-12-25 ENCOUNTER — Ambulatory Visit: Payer: Medicare Other | Admitting: Internal Medicine

## 2013-12-27 ENCOUNTER — Encounter: Payer: Self-pay | Admitting: Internal Medicine

## 2013-12-27 ENCOUNTER — Ambulatory Visit (INDEPENDENT_AMBULATORY_CARE_PROVIDER_SITE_OTHER): Payer: Medicare HMO | Admitting: Internal Medicine

## 2013-12-27 VITALS — BP 120/80 | HR 76 | Temp 97.9°F | Ht 65.0 in | Wt 132.0 lb

## 2013-12-27 DIAGNOSIS — R195 Other fecal abnormalities: Secondary | ICD-10-CM

## 2013-12-27 DIAGNOSIS — D4959 Neoplasm of unspecified behavior of other genitourinary organ: Secondary | ICD-10-CM

## 2013-12-27 DIAGNOSIS — M949 Disorder of cartilage, unspecified: Secondary | ICD-10-CM

## 2013-12-27 DIAGNOSIS — K219 Gastro-esophageal reflux disease without esophagitis: Secondary | ICD-10-CM

## 2013-12-27 DIAGNOSIS — M899 Disorder of bone, unspecified: Secondary | ICD-10-CM

## 2013-12-27 DIAGNOSIS — I10 Essential (primary) hypertension: Secondary | ICD-10-CM

## 2013-12-27 DIAGNOSIS — F17200 Nicotine dependence, unspecified, uncomplicated: Secondary | ICD-10-CM

## 2013-12-27 DIAGNOSIS — M8580 Other specified disorders of bone density and structure, unspecified site: Secondary | ICD-10-CM

## 2013-12-27 MED ORDER — RANITIDINE HCL 150 MG PO TABS: 150.0000 mg | ORAL_TABLET | Freq: Every day | ORAL | Status: DC

## 2013-12-27 MED ORDER — AMLODIPINE BESYLATE 10 MG PO TABS: 10.0000 mg | ORAL_TABLET | Freq: Every day | ORAL | Status: DC

## 2013-12-27 MED ORDER — SIMVASTATIN 20 MG PO TABS: 10.0000 mg | ORAL_TABLET | Freq: Every day | ORAL | Status: DC

## 2013-12-27 MED ORDER — TIOTROPIUM BROMIDE MONOHYDRATE 18 MCG IN CAPS: 18.0000 ug | ORAL_CAPSULE | Freq: Every day | RESPIRATORY_TRACT | Status: DC

## 2013-12-27 MED ORDER — VALSARTAN-HYDROCHLOROTHIAZIDE 160-25 MG PO TABS: 1.0000 | ORAL_TABLET | Freq: Every day | ORAL | Status: DC

## 2013-12-27 NOTE — Assessment & Plan Note (Signed)
Well controlled.  No change in medication. BP: 120/80 mmHg  Lab Results  Component Value Date   CREATININE 0.8 06/26/2013   Lab Results  Component Value Date   NA 138 06/26/2013   K 3.7 06/26/2013   CL 102 06/26/2013   CO2 29 06/26/2013

## 2013-12-27 NOTE — Progress Notes (Signed)
Pre visit review using our clinic review tool, if applicable. No additional management support is needed unless otherwise documented below in the visit note. 

## 2013-12-27 NOTE — Assessment & Plan Note (Signed)
Status post bilateral oophorectomy.  Patient found to have testosterone secreting tumor ([place]).

## 2013-12-27 NOTE — Assessment & Plan Note (Signed)
Patient completed colonoscopy.  It was notable for sessile polyps and internal hemorrhoids.  Guaiac positive stools presumed secondary to internal hemorrhoids.

## 2013-12-27 NOTE — Assessment & Plan Note (Signed)
Last bone density scan completed on 06/26/2013. It showed osteopenia with T score of -1.9.  Patient was previously on Fosamax. Patient discontinued secondary to gastrointestinal side effects. Patient reluctant to try Prolia.  Continue calcium and vitamin D supplementation for now.

## 2013-12-27 NOTE — Assessment & Plan Note (Signed)
Stressed importance of tobacco cessation.  She does not want to quit smoking.  She understands potential adverse health effects.

## 2013-12-27 NOTE — Progress Notes (Signed)
Subjective:    Patient ID: Christine Keller, female    DOB: 03/05/1941, 73 y.o.   MRN: [id number]  HPI  73 year old white female with history of hypertension, hyperlipidemia and testosterone secreting ovarian tumor for followup.  Interval medical history-patient referred to gastroenterologist for colonoscopy. Patient had heme positive stools. Colonoscopy showed several cysts of polyps and internal hemorrhoids.  Patient found to have tubular adenoma and hyperplastic polyp.  Hypertension-stable  COPD/chronic tobacco use-no change in dyspnea. Patient not interested in smoking cessation.  Osteopenia - patient took Fosamax in the past for 2-3 years. She discontinued secondary to gastrointestinal side effects.    Review of Systems Negative for chest pain.  Chronic intermittent heartburn.  She takes Ranitidine 150 mg once daily.    Past Medical History  Diagnosis Date  . Hyperlipidemia   . Hypertension   . Thyroid nodule   . Tobacco abuse   . COPD (chronic obstructive pulmonary disease) 1.31.11    PFT FEV1 1.19(56%), FVC 2.68(90%), FEV1% 44, TLC 5.80(115%), DLCO 52%  . Osteoporosis     OSTEOPENIA  . Bilateral carpal tunnel syndrome   . Ovarian tumor 2014    Testosterone secreting ovarain tumor    History   Social History  . Marital Status: Married    Spouse Name: N/A    Number of Children: N/A  . Years of Education: N/A   Occupational History  . Retired     Network engineer   Social History Main Topics  . Smoking status: Current Every Day Smoker -- 1.00 packs/day    Types: Cigarettes  . Smokeless tobacco: Never Used     Comment: Less than 1 ppd.  . Alcohol Use: 4.2 oz/week    7 Glasses of wine per week  . Drug Use: No  . Sexual Activity: Not on file   Other Topics Concern  . Not on file   Social History Narrative   Regular exercise:  3 x weekly   Caffeine Use:  occasional    Past Surgical History  Procedure Laterality Date  . Tonsillectomy    . Carpal tunnel  release  05/26/2012    Procedure: CARPAL TUNNEL RELEASE;  Surgeon: Cammie Sickle., MD;  Location: [facility];  Service: Orthopedics;  Laterality: Left;  Injection of Right thumb Carpal-Metacarpal joint also  . Ulnar nerve transposition  05/26/2012    Procedure: ULNAR NERVE DECOMPRESSION/TRANSPOSITION;  Surgeon: Cammie Sickle., MD;  Location: [facility];  Service: Orthopedics;  Laterality: Left;  . Carpal tunnel release  08/16/2012    Procedure: CARPAL TUNNEL RELEASE;  Surgeon: Cammie Sickle., MD;  Location: [facility];  Service: Orthopedics;  Laterality: Right;  Inject Left Thumb Carpal Metacarpal Joint  . Steriod injection  08/16/2012    Procedure: STEROID INJECTION;  Surgeon: Cammie Sickle., MD;  Location: [facility];  Service: Orthopedics;  Laterality: Left;  Inject Left Thumb Carpal Metacarpal Joint  . Other surgical history  12/14/12    bilateral oopherectomy [facility]    Family History  Problem Relation Age of Onset  . Heart disease Mother   . Hypertension Mother   . Asthma Father   . Emphysema Father   . Heart disease Brother     Allergies  Allergen Reactions  . Codeine     REACTION: hallucinations  . Niacin     REACTION: rash    Current Outpatient Prescriptions on File Prior to Visit  Medication Sig Dispense  Refill  . aspirin 81 MG tablet Take 81 mg by mouth daily.        Marland Kitchen CALCIUM-MAGNESIUM-ZINC PO Take by mouth daily.      . Cholecalciferol (VITAMIN D3) 2000 UNITS capsule Take 2,000 Units by mouth daily.      . Coenzyme Q10 (COQ10) 200 MG CAPS Take 1 capsule by mouth daily.        . Garlic Oil (ODORLESS GARLIC) 161 MG TABS Take 1 tablet by mouth daily.        Nyoka Cowden Tea 315 MG CAPS Take 1 capsule by mouth daily.        . Multiple Vitamin (MULTIVITAMIN) tablet Take 1 tablet by mouth daily.        . OMEGA 3 1000 MG CAPS Take 1 capsule by mouth 2 (two) times daily.        . Probiotic Product  (PROBIOTIC DAILY PO) Take by mouth daily.      . Simethicone (GAS-X PO) Take by mouth 2 (two) times daily.      . Thiamine HCl (VITAMIN B-1) 100 MG tablet Take 100 mg by mouth daily.        . Vitamin Mixture (ESTER-C) 500-60 MG TABS Take 1 tablet by mouth daily.         No current facility-administered medications on file prior to visit.    BP 120/80  Pulse 76  Temp(Src) 97.9 F (36.6 C) (Oral)  Ht 5\' 5"  (1.651 m)  Wt 132 lb (59.875 kg)  BMI 21.97 kg/m2      Objective:   Physical Exam  Constitutional: She is oriented to person, place, and time. She appears well-developed and well-nourished. No distress.  HENT:  Head: Normocephalic and atraumatic.  Neck: Neck supple.  No carotid bruit  Cardiovascular: Normal rate, regular rhythm and normal heart sounds.   No murmur heard. Pulmonary/Chest: Effort normal.  Prolonged expiration, mild decreased breath sounds throughout  Musculoskeletal: She exhibits no edema.  Neurological: She is alert and oriented to person, place, and time. No cranial nerve deficit.  Skin: Skin is warm and dry.  Psychiatric: She has a normal mood and affect. Her behavior is normal.          Assessment & Plan:

## 2013-12-27 NOTE — Assessment & Plan Note (Signed)
Patient reports intermittent reflux symptoms despite taking ranitidine 150 mg once daily. Patient advised to take ranitidine 150 mg twice daily. Antireflux measures reviewed. I would avoid PPI considering her history of osteopenia.

## 2013-12-28 LAB — BASIC METABOLIC PANEL
BUN: 12 mg/dL (ref 6–23)
CO2: 29 mEq/L (ref 19–32)
Calcium: 10.4 mg/dL (ref 8.4–10.5)
Chloride: 102 mEq/L (ref 96–112)
Creatinine, Ser: 0.8 mg/dL (ref 0.4–1.2)
GFR: 74.59 mL/min (ref >60.00)
Glucose, Bld: 82 mg/dL (ref 70–99)
Potassium: 3.9 mEq/L (ref 3.5–5.1)
Sodium: 139 mEq/L (ref 135–145)

## 2014-05-27 ENCOUNTER — Encounter: Payer: Self-pay | Admitting: Internal Medicine

## 2014-05-27 DIAGNOSIS — R7989 Other specified abnormal findings of blood chemistry: Secondary | ICD-10-CM

## 2014-06-29 ENCOUNTER — Encounter: Payer: Medicare HMO | Admitting: Internal Medicine

## 2014-07-02 ENCOUNTER — Ambulatory Visit (INDEPENDENT_AMBULATORY_CARE_PROVIDER_SITE_OTHER): Payer: Medicare HMO

## 2014-07-02 DIAGNOSIS — Z23 Encounter for immunization: Secondary | ICD-10-CM

## 2014-08-02 ENCOUNTER — Encounter: Payer: Medicare HMO | Admitting: Internal Medicine

## 2014-08-08 ENCOUNTER — Encounter: Payer: Self-pay | Admitting: Internal Medicine

## 2014-08-08 ENCOUNTER — Ambulatory Visit (INDEPENDENT_AMBULATORY_CARE_PROVIDER_SITE_OTHER): Payer: Medicare HMO | Admitting: Internal Medicine

## 2014-08-08 VITALS — BP 132/64 | HR 72 | Temp 98.5°F | Ht 64.0 in | Wt 130.0 lb

## 2014-08-08 DIAGNOSIS — Z0000 Encounter for general adult medical examination without abnormal findings: Secondary | ICD-10-CM

## 2014-08-08 DIAGNOSIS — I10 Essential (primary) hypertension: Secondary | ICD-10-CM

## 2014-08-08 DIAGNOSIS — E785 Hyperlipidemia, unspecified: Secondary | ICD-10-CM

## 2014-08-08 LAB — LIPID PANEL
Cholesterol: 174 mg/dL (ref 0–200)
HDL: 53.7 mg/dL (ref >39.00)
LDL Cholesterol: 96 mg/dL (ref 0–99)
NonHDL: 120.3
Total CHOL/HDL Ratio: 3
Triglycerides: 124 mg/dL (ref 0.0–149.0)
VLDL: 24.8 mg/dL (ref 0.0–40.0)

## 2014-08-08 LAB — CBC WITH DIFFERENTIAL/PLATELET
Basophils Absolute: 0 10*3/uL (ref 0.0–0.1)
Basophils Relative: 0.5 % (ref 0.0–3.0)
Eosinophils Absolute: 0.1 10*3/uL (ref 0.0–0.7)
Eosinophils Relative: 1.7 % (ref 0.0–5.0)
HCT: 41 % (ref 36.0–46.0)
Hemoglobin: 13.7 g/dL (ref 12.0–15.0)
Lymphocytes Relative: 36.1 % (ref 12.0–46.0)
Lymphs Abs: 2.7 10*3/uL (ref 0.7–4.0)
MCHC: 33.5 g/dL (ref 30.0–36.0)
MCV: 97.4 fl (ref 78.0–100.0)
Monocytes Absolute: 0.8 10*3/uL (ref 0.1–1.0)
Monocytes Relative: 10.3 % (ref 3.0–12.0)
Neutro Abs: 3.9 10*3/uL (ref 1.4–7.7)
Neutrophils Relative %: 51.4 % (ref 43.0–77.0)
Platelets: 270 10*3/uL (ref 150.0–400.0)
RBC: 4.21 Mil/uL (ref 3.87–5.11)
RDW: 12.7 % (ref 11.5–15.5)
WBC: 7.6 10*3/uL (ref 4.0–10.5)

## 2014-08-08 LAB — BASIC METABOLIC PANEL
BUN: 13 mg/dL (ref 6–23)
CO2: 24 mEq/L (ref 19–32)
Calcium: 10.1 mg/dL (ref 8.4–10.5)
Chloride: 104 mEq/L (ref 96–112)
Creatinine, Ser: 0.8 mg/dL (ref 0.4–1.2)
GFR: 79 mL/min (ref >60.00)
Glucose, Bld: 85 mg/dL (ref 70–99)
Potassium: 4 mEq/L (ref 3.5–5.1)
Sodium: 139 mEq/L (ref 135–145)

## 2014-08-08 LAB — HEPATIC FUNCTION PANEL
ALT: 19 U/L (ref 0–35)
AST: 27 U/L (ref 0–37)
Albumin: 3.7 g/dL (ref 3.5–5.2)
Alkaline Phosphatase: 73 U/L (ref 39–117)
Bilirubin, Direct: 0.1 mg/dL (ref 0.0–0.3)
Total Bilirubin: 0.6 mg/dL (ref 0.2–1.2)
Total Protein: 7.4 g/dL (ref 6.0–8.3)

## 2014-08-08 NOTE — Assessment & Plan Note (Signed)
Continue same dose of simvastatin. Monitor fasting lipid panel and LFTs.

## 2014-08-08 NOTE — Assessment & Plan Note (Signed)
Reviewed adult health maintenance protocols.  Patient up-to-date with appropriate adult vaccines. Patient strongly encouraged to regarding tobacco cessation. Patient not interested in quitting smoking at this time.  Medicare wellness questionnaire reviewed. See attached form for additional details.  Patient advised to schedule screening mammogram.

## 2014-08-08 NOTE — Progress Notes (Signed)
Pre visit review using our clinic review tool, if applicable. No additional management support is needed unless otherwise documented below in the visit note. 

## 2014-08-08 NOTE — Progress Notes (Signed)
Subjective:    Patient ID: Christine Keller, female    DOB: 03/15/1941, 73 y.o.   MRN: [id number]  HPI  73 year old white female with history of essential hypertension, chronic tobacco use and COPD for Medicare wellness exam. Medicare questionnaire reviewed in detail. She has not had any hospitalizations or emergency room visits in the past year. She has not seen any new subspecialists within the last year. No new surgeries within the last 1 year.  Her last eye exam was completed by [facility] ophthalmology in 07/18/2013. There is no report of glaucoma. She wears corrective lenses.   Patient drinks socially. She continues to smoke one half pack per day. She is not interested in smoking cessation. She does not use illicit drugs. She walks on a regular basis. She has some dyspnea with exertion. She is sexually active. She is monogamous.  Patient with the perform activities of daily living. She denies any gait abnormality or issues with falling. She denies any hearing loss.  She denies any depressive symptoms.  She denies any memory problems. Patient does not misplace items. Patient able to balance her own checkbook.  Hypertension - stable.  Hyperlipidemia - stable  Osteopenia - Patient still does not want to take any medication for osteopenia. She took Fosamax in the past but discontinued due to gastrointestinal side effects. She is planning oral surgery and declines IV bisphosphonates. She also declines Prolia.  Review of Systems   Constitutional: Negative for activity change, appetite change and unexpected weight change.  Eyes: Negative for visual disturbance.  Respiratory: Negative for cough, chest tightness. Dyspnea with exertion   Cardiovascular: Negative for chest pain.  Genitourinary: Negative for difficulty urinating.  Neurological: Negative for headaches.  Gastrointestinal: Negative for abdominal pain, heartburn melena or hematochezia Psych: Negative for depression or anxiety         Past Medical History  Diagnosis Date  . Hyperlipidemia   . Hypertension   . Thyroid nodule   . Tobacco abuse   . COPD (chronic obstructive pulmonary disease) 1.31.11    PFT FEV1 1.19(56%), FVC 2.68(90%), FEV1% 44, TLC 5.80(115%), DLCO 52%  . Osteoporosis     OSTEOPENIA  . Bilateral carpal tunnel syndrome   . Ovarian tumor 2014    Testosterone secreting ovarain tumor    History   Social History  . Marital Status: Married    Spouse Name: N/A    Number of Children: N/A  . Years of Education: N/A   Occupational History  . Retired     Network engineer   Social History Main Topics  . Smoking status: Current Every Day Smoker -- 1.00 packs/day    Types: Cigarettes  . Smokeless tobacco: Never Used     Comment: Less than 1 ppd.  . Alcohol Use: 4.2 oz/week    7 Glasses of wine per week  . Drug Use: No  . Sexual Activity: Not on file   Other Topics Concern  . Not on file   Social History Narrative   Regular exercise:  3 x weekly   Caffeine Use:  occasional    Past Surgical History  Procedure Laterality Date  . Tonsillectomy    . Carpal tunnel release  05/26/2012    Procedure: CARPAL TUNNEL RELEASE;  Surgeon: Cammie Sickle., MD;  Location: [place];  Service: Orthopedics;  Laterality: Left;  Injection of Right thumb Carpal-Metacarpal joint also  . Ulnar nerve transposition  05/26/2012    Procedure: ULNAR NERVE DECOMPRESSION/TRANSPOSITION;  Surgeon: Herbie Baltimore  Christena Flake., MD;  Location: [facility];  Service: Orthopedics;  Laterality: Left;  . Carpal tunnel release  08/16/2012    Procedure: CARPAL TUNNEL RELEASE;  Surgeon: Cammie Sickle., MD;  Location: [facility];  Service: Orthopedics;  Laterality: Right;  Inject Left Thumb Carpal Metacarpal Joint  . Steriod injection  08/16/2012    Procedure: STEROID INJECTION;  Surgeon: Cammie Sickle., MD;  Location: [place];  Service: Orthopedics;  Laterality:  Left;  Inject Left Thumb Carpal Metacarpal Joint  . Other surgical history  12/14/12    bilateral oopherectomy [facility]    Family History  Problem Relation Age of Onset  . Heart disease Mother   . Hypertension Mother   . Asthma Father   . Emphysema Father   . Heart disease Brother     Allergies  Allergen Reactions  . Codeine     REACTION: hallucinations  . Niacin     REACTION: rash    Current Outpatient Prescriptions on File Prior to Visit  Medication Sig Dispense Refill  . amLODipine (NORVASC) 10 MG tablet Take 1 tablet (10 mg total) by mouth daily. 90 tablet 3  . aspirin 81 MG tablet Take 81 mg by mouth daily.      Marland Kitchen CALCIUM-MAGNESIUM-ZINC PO Take by mouth daily.    . Cholecalciferol (VITAMIN D3) 2000 UNITS capsule Take 2,000 Units by mouth daily.    . Coenzyme Q10 (COQ10) 200 MG CAPS Take 1 capsule by mouth daily.      . Garlic Oil (ODORLESS GARLIC) 782 MG TABS Take 1 tablet by mouth daily.      Nyoka Cowden Tea 315 MG CAPS Take 1 capsule by mouth daily.      . Multiple Vitamin (MULTIVITAMIN) tablet Take 1 tablet by mouth daily.      . OMEGA 3 1000 MG CAPS Take 1 capsule by mouth 2 (two) times daily.      . Probiotic Product (PROBIOTIC DAILY PO) Take by mouth daily.    . ranitidine (ZANTAC) 150 MG tablet Take 150 mg by mouth 2 (two) times daily.    . simvastatin (ZOCOR) 20 MG tablet Take 0.5 tablets (10 mg total) by mouth daily. 90 tablet 3  . Thiamine HCl (VITAMIN B-1) 100 MG tablet Take 100 mg by mouth daily.      Marland Kitchen tiotropium (SPIRIVA HANDIHALER) 18 MCG inhalation capsule Place 1 capsule (18 mcg total) into inhaler and inhale daily. 90 capsule 3  . valsartan-hydrochlorothiazide (DIOVAN-HCT) 160-25 MG per tablet Take 1 tablet by mouth daily. 90 tablet 3  . Vitamin Mixture (ESTER-C) 500-60 MG TABS Take 1 tablet by mouth daily.       No current facility-administered medications on file prior to visit.    BP 132/64 mmHg  Pulse 72  Temp(Src) 98.5 F (36.9 C) (Oral)  Ht 5\' 4"   (1.626 m)  Wt 130 lb (58.968 kg)  BMI 22.30 kg/m2    Objective:   Physical Exam  Constitutional: She is oriented to person, place, and time.  Thin, pleasant 73 year old female  HENT:  Head: Normocephalic and atraumatic.  Right Ear: External ear normal.  Left Ear: External ear normal.  Mouth/Throat: Oropharynx is clear and moist.  Eyes: EOM are normal. Pupils are equal, round, and reactive to light.  Neck: Neck supple.  No carotid bruit  Cardiovascular: Normal rate, regular rhythm and normal heart sounds.   Pulmonary/Chest: Effort normal and breath sounds normal. She has  no wheezes.  Abdominal: Soft. Bowel sounds are normal. There is no tenderness.  Musculoskeletal: She exhibits no edema.  Lymphadenopathy:    She has no cervical adenopathy.  Neurological: She is alert and oriented to person, place, and time. No cranial nerve deficit.  Skin: Skin is warm and dry.  Psychiatric: She has a normal mood and affect. Her behavior is normal.          Assessment & Plan:

## 2014-08-08 NOTE — Assessment & Plan Note (Signed)
Well-controlled no change in medication. Monitor electrolytes and kidney function BP: 132/64 mmHg

## 2014-08-09 ENCOUNTER — Telehealth: Payer: Self-pay | Admitting: Internal Medicine

## 2014-08-09 ENCOUNTER — Other Ambulatory Visit: Payer: Self-pay | Admitting: Internal Medicine

## 2014-08-09 DIAGNOSIS — Z1231 Encounter for screening mammogram for malignant neoplasm of breast: Secondary | ICD-10-CM

## 2014-08-09 LAB — TSH: TSH: 6.2 u[IU]/mL — ABNORMAL HIGH (ref 0.35–4.50)

## 2014-08-09 NOTE — Telephone Encounter (Signed)
emmi emailed °

## 2014-08-13 ENCOUNTER — Ambulatory Visit (HOSPITAL_BASED_OUTPATIENT_CLINIC_OR_DEPARTMENT_OTHER)
Admission: RE | Admit: 2014-08-13 | Discharge: 2014-08-13 | Disposition: A | Discharge status: Home / Self Care | Payer: Medicare HMO | Source: Ambulatory Visit | Attending: Internal Medicine | Admitting: Internal Medicine

## 2014-08-13 DIAGNOSIS — Z1231 Encounter for screening mammogram for malignant neoplasm of breast: Secondary | ICD-10-CM | POA: Diagnosis not present

## 2014-08-17 ENCOUNTER — Telehealth: Payer: Self-pay | Admitting: Internal Medicine

## 2014-08-17 NOTE — Telephone Encounter (Signed)
Pt would like mammogram results

## 2014-08-17 NOTE — Telephone Encounter (Signed)
Spoke to pt, told her Mammogram was normal and she should get a letter in the mail from the facility. Pt verbalized understanding.

## 2014-10-10 ENCOUNTER — Other Ambulatory Visit (INDEPENDENT_AMBULATORY_CARE_PROVIDER_SITE_OTHER): Payer: Medicare HMO

## 2014-10-10 DIAGNOSIS — I10 Essential (primary) hypertension: Secondary | ICD-10-CM

## 2014-10-10 DIAGNOSIS — R7989 Other specified abnormal findings of blood chemistry: Secondary | ICD-10-CM

## 2014-10-10 LAB — TSH: TSH: 24.45 u[IU]/mL — ABNORMAL HIGH (ref 0.35–4.50)

## 2014-10-18 ENCOUNTER — Encounter: Payer: Self-pay | Admitting: Internal Medicine

## 2014-10-19 ENCOUNTER — Telehealth: Payer: Self-pay | Admitting: Internal Medicine

## 2014-10-19 ENCOUNTER — Encounter: Payer: Self-pay | Admitting: Internal Medicine

## 2014-10-19 MED ORDER — LEVOTHYROXINE SODIUM 50 MCG PO TABS: ORAL_TABLET | ORAL | Status: DC

## 2014-10-19 NOTE — Telephone Encounter (Signed)
Pt hus viewed blood work on [employer] and blood work was abnormal please advise. Pt hus is aware md out of office

## 2014-10-19 NOTE — Telephone Encounter (Signed)
Message sent via my chart

## 2014-11-13 ENCOUNTER — Encounter: Payer: Self-pay | Admitting: Internal Medicine

## 2014-11-13 ENCOUNTER — Telehealth: Payer: Self-pay | Admitting: Internal Medicine

## 2014-11-13 ENCOUNTER — Ambulatory Visit (INDEPENDENT_AMBULATORY_CARE_PROVIDER_SITE_OTHER): Payer: Medicare HMO | Admitting: Internal Medicine

## 2014-11-13 VITALS — BP 128/50 | HR 88 | Temp 97.9°F | Wt 128.5 lb

## 2014-11-13 DIAGNOSIS — J441 Chronic obstructive pulmonary disease with (acute) exacerbation: Secondary | ICD-10-CM

## 2014-11-13 DIAGNOSIS — J988 Other specified respiratory disorders: Secondary | ICD-10-CM

## 2014-11-13 DIAGNOSIS — Z720 Tobacco use: Secondary | ICD-10-CM

## 2014-11-13 DIAGNOSIS — J069 Acute upper respiratory infection, unspecified: Secondary | ICD-10-CM

## 2014-11-13 DIAGNOSIS — F17200 Nicotine dependence, unspecified, uncomplicated: Secondary | ICD-10-CM

## 2014-11-13 DIAGNOSIS — R0602 Shortness of breath: Secondary | ICD-10-CM

## 2014-11-13 MED ORDER — ALBUTEROL SULFATE (2.5 MG/3ML) 0.083% IN NEBU
2.5000 mg | INHALATION_SOLUTION | Freq: Once | RESPIRATORY_TRACT | Status: AC
  Administered 2014-11-13: 2.5 mg via RESPIRATORY_TRACT

## 2014-11-13 MED ORDER — ALBUTEROL SULFATE 108 (90 BASE) MCG/ACT IN AEPB: 2.0000 | INHALATION_SPRAY | Freq: Four times a day (QID) | RESPIRATORY_TRACT | Status: DC | PRN

## 2014-11-13 NOTE — Telephone Encounter (Signed)
[facility] - Client [place] [facility] Patient Name: Christine Keller DOB: [birth year] Initial Comment Caller states wife is having difficulty breathing Nurse Assessment Nurse: Ronnald Ramp, RN, Miranda Date/Time (Eastern Time): 11/13/2014 2:36:00 PM Confirm and document reason for call. If symptomatic, describe symptoms. ---Caller states his wife has had a cough and sinus pain for the last 2-3 days. She is having SOB. Has the patient traveled out of the country within the last 30 days? ---No Does the patient require triage? ---Yes Related visit to physician within the last 2 weeks? ---No Does the PT have any chronic conditions? (i.e. diabetes, asthma, etc.) ---Yes List chronic conditions. ---COPD (does not have a rescue inhaler), HTN, Thyroid Guidelines Guideline Title Affirmed Question Affirmed Notes Cough - Acute Non-Productive [1] Sinus pain (around cheekbone or eye) AND [2] present > 24 hours using nasal washes and pain meds Final Disposition User See Physician within 24 Hours Ronnald Ramp, RN, Miranda Comments Appt scheduled for 3:45 with Shanon Ace

## 2014-11-13 NOTE — Patient Instructions (Signed)
This actgs like a viral respiratory infection with secondary wheezing . No obv bacterial infection at this time . Can use the proair  Up to every 6 hours as needed . If getting fever  Thick discolored phelgm or blood and getting worse contact office.  Illness may last  2 weeks   But usually a lot better after the first week.  Sometimes we add prednisone also if needed.  Stopping tobacco  Smoking is the best thing you can do for your lungs and prevent worsening of  Your lung disease.  Can use saline   Afrin nose spray for no loinger than 3 days  At a time   Sudafed if blood pressure is ok .    Chronic Obstructive Pulmonary Disease Chronic obstructive pulmonary disease (COPD) is a common lung condition in which airflow from the lungs is limited. COPD is a general term that can be used to describe many different lung problems that limit airflow, including both chronic bronchitis and emphysema. If you have COPD, your lung function will probably never return to normal, but there are measures you can take to improve lung function and make yourself feel better.  CAUSES   Smoking (common).   Exposure to secondhand smoke.   Genetic problems.  Chronic inflammatory lung diseases or recurrent infections. SYMPTOMS   Shortness of breath, especially with physical activity.   Deep, persistent (chronic) cough with a large amount of thick mucus.   Wheezing.   Rapid breaths (tachypnea).   Gray or bluish discoloration (cyanosis) of the skin, especially in fingers, toes, or lips.   Fatigue.   Weight loss.   Frequent infections or episodes when breathing symptoms become much worse (exacerbations).   Chest tightness. DIAGNOSIS  Your health care provider will take a medical history and perform a physical examination to make the initial diagnosis. Additional tests for COPD may include:   Lung (pulmonary) function tests.  Chest X-ray.  CT scan.  Blood tests. TREATMENT  Treatment  available to help you feel better when you have COPD includes:   Inhaler and nebulizer medicines. These help manage the symptoms of COPD and make your breathing more comfortable.  Supplemental oxygen. Supplemental oxygen is only helpful if you have a low oxygen level in your blood.   Exercise and physical activity. These are beneficial for nearly all people with COPD. Some people may also benefit from a pulmonary rehabilitation program. HOME CARE INSTRUCTIONS   Take all medicines (inhaled or pills) as directed by your health care provider.  Avoid over-the-counter medicines or cough syrups that dry up your airway (such as antihistamines) and slow down the elimination of secretions unless instructed otherwise by your health care provider.   If you are a smoker, the most important thing that you can do is stop smoking. Continuing to smoke will cause further lung damage and breathing trouble. Ask your health care provider for help with quitting smoking. He or she can direct you to community resources or hospitals that provide support.  Avoid exposure to irritants such as smoke, chemicals, and fumes that aggravate your breathing.  Use oxygen therapy and pulmonary rehabilitation if directed by your health care provider. If you require home oxygen therapy, ask your health care provider whether you should purchase a pulse oximeter to measure your oxygen level at home.   Avoid contact with individuals who have a contagious illness.  Avoid extreme temperature and humidity changes.  Eat healthy foods. Eating smaller, more frequent meals and resting before  meals may help you maintain your strength.  Stay active, but balance activity with periods of rest. Exercise and physical activity will help you maintain your ability to do things you want to do.  Preventing infection and hospitalization is very important when you have COPD. Make sure to receive all the vaccines your health care provider  recommends, especially the pneumococcal and influenza vaccines. Ask your health care provider whether you need a pneumonia vaccine.  Learn and use relaxation techniques to manage stress.  Learn and use controlled breathing techniques as directed by your health care provider. Controlled breathing techniques include:   Pursed lip breathing. Start by breathing in (inhaling) through your nose for 1 second. Then, purse your lips as if you were going to whistle and breathe out (exhale) through the pursed lips for 2 seconds.   Diaphragmatic breathing. Start by putting one hand on your abdomen just above your waist. Inhale slowly through your nose. The hand on your abdomen should move out. Then purse your lips and exhale slowly. You should be able to feel the hand on your abdomen moving in as you exhale.   Learn and use controlled coughing to clear mucus from your lungs. Controlled coughing is a series of short, progressive coughs. The steps of controlled coughing are:  1. Lean your head slightly forward.  2. Breathe in deeply using diaphragmatic breathing.  3. Try to hold your breath for 3 seconds.  4. Keep your mouth slightly open while coughing twice.  5. Spit any mucus out into a tissue.  6. Rest and repeat the steps once or twice as needed. SEEK MEDICAL CARE IF:   You are coughing up more mucus than usual.   There is a change in the color or thickness of your mucus.   Your breathing is more labored than usual.   Your breathing is faster than usual.  SEEK IMMEDIATE MEDICAL CARE IF:   You have shortness of breath while you are resting.   You have shortness of breath that prevents you from:  Being able to talk.   Performing your usual physical activities.   You have chest pain lasting longer than 5 minutes.   Your skin color is more cyanotic than usual.  You measure low oxygen saturations for longer than 5 minutes with a pulse oximeter. MAKE SURE YOU:    Understand these instructions.  Will watch your condition.  Will get help right away if you are not doing well or get worse. Document Released: 07/01/2005 Document Revised: 02/05/2014 Document Reviewed: 05/18/2013 [facility] Patient Information 2015 [place], [place]. This information is not intended to replace advice given to you by your health care provider. Make sure you discuss any questions you have with your health care provider. Sinusitis Sinusitis is redness, soreness, and inflammation of the paranasal sinuses. Paranasal sinuses are air pockets within the bones of your face (beneath the eyes, the middle of the forehead, or above the eyes). In healthy paranasal sinuses, mucus is able to drain out, and air is able to circulate through them by way of your nose. However, when your paranasal sinuses are inflamed, mucus and air can become trapped. This can allow bacteria and other germs to grow and cause infection. Sinusitis can develop quickly and last only a short time (acute) or continue over a long period (chronic). Sinusitis that lasts for more than 12 weeks is considered chronic.  CAUSES  Causes of sinusitis include:  Allergies.  Structural abnormalities, such as displacement of the cartilage  that separates your nostrils (deviated septum), which can decrease the air flow through your nose and sinuses and affect sinus drainage.  Functional abnormalities, such as when the small hairs (cilia) that line your sinuses and help remove mucus do not work properly or are not present. SIGNS AND SYMPTOMS  Symptoms of acute and chronic sinusitis are the same. The primary symptoms are pain and pressure around the affected sinuses. Other symptoms include:  Upper toothache.  Earache.  Headache.  Bad breath.  Decreased sense of smell and taste.  A cough, which worsens when you are lying flat.  Fatigue.  Fever.  Thick drainage from your nose, which often is green and may contain pus  (purulent).  Swelling and warmth over the affected sinuses. DIAGNOSIS  Your health care provider will perform a physical exam. During the exam, your health care provider may:  Look in your nose for signs of abnormal growths in your nostrils (nasal polyps).  Tap over the affected sinus to check for signs of infection.  View the inside of your sinuses (endoscopy) using an imaging device that has a light attached (endoscope). If your health care provider suspects that you have chronic sinusitis, one or more of the following tests may be recommended:  Allergy tests.  Nasal culture. A sample of mucus is taken from your nose, sent to a lab, and screened for bacteria.  Nasal cytology. A sample of mucus is taken from your nose and examined by your health care provider to determine if your sinusitis is related to an allergy. TREATMENT  Most cases of acute sinusitis are related to a viral infection and will resolve on their own within 10 days. Sometimes medicines are prescribed to help relieve symptoms (pain medicine, decongestants, nasal steroid sprays, or saline sprays).  However, for sinusitis related to a bacterial infection, your health care provider will prescribe antibiotic medicines. These are medicines that will help kill the bacteria causing the infection.  Rarely, sinusitis is caused by a fungal infection. In theses cases, your health care provider will prescribe antifungal medicine. For some cases of chronic sinusitis, surgery is needed. Generally, these are cases in which sinusitis recurs more than 3 times per year, despite other treatments. HOME CARE INSTRUCTIONS   Drink plenty of water. Water helps thin the mucus so your sinuses can drain more easily.  Use a humidifier.  Inhale steam 3 to 4 times a day (for example, sit in the bathroom with the shower running).  Apply a warm, moist washcloth to your face 3 to 4 times a day, or as directed by your health care provider.  Use  saline nasal sprays to help moisten and clean your sinuses.  Take medicines only as directed by your health care provider.  If you were prescribed either an antibiotic or antifungal medicine, finish it all even if you start to feel better. SEEK IMMEDIATE MEDICAL CARE IF: 7. You have increasing pain or severe headaches. 8. You have nausea, vomiting, or drowsiness. 9. You have swelling around your face. 10. You have vision problems. 11. You have a stiff neck. 12. You have difficulty breathing. MAKE SURE YOU:   Understand these instructions.  Will watch your condition.  Will get help right away if you are not doing well or get worse. Document Released: 09/21/2005 Document Revised: 02/05/2014 Document Reviewed: 10/06/2011 [facility] Patient Information 2015 [place], [place]. This information is not intended to replace advice given to you by your health care provider. Make sure you discuss any questions  you have with your health care provider.

## 2014-11-13 NOTE — Telephone Encounter (Signed)
Noted  

## 2014-11-13 NOTE — Progress Notes (Signed)
Pre visit review using our clinic review tool, if applicable. No additional management support is needed unless otherwise documented below in the visit note.  Chief Complaint  Patient presents with  . Chills    Started Saturday  . Shortness of Breath  . Cough  . Wheezing  . Sinus Pain / Pressure    HPI: Patient Christine Keller  comes in today for SDA for  new problem evaluation. PCP NA   Onset  3 days ago of achy and  Fever.  Sletp ;ast this am   Now uri stuffiness    Runny nose.    COPD   1/2 ppd .  Baseline can walk about 30 min  At her pace.  spiriva  For lungs  And has expired proair that she hasnt used   Has sinus pressure yesterday and not today . No fever hemoptysis  SOB at this time. just a few hours this am .   ROS: See pertinent positives and negatives per HPI. Has ht.  hasnt stopped tobacco  hasnt been interested in doing this .  At this time  "Enjoys it" Past Medical History  Diagnosis Date  . Hyperlipidemia   . Hypertension   . Thyroid nodule   . Tobacco abuse   . COPD (chronic obstructive pulmonary disease) 1.31.11    PFT FEV1 1.19(56%), FVC 2.68(90%), FEV1% 44, TLC 5.80(115%), DLCO 52%  . Osteoporosis     OSTEOPENIA  . Bilateral carpal tunnel syndrome   . Ovarian tumor 2014    Testosterone secreting ovarain tumor    Family History  Problem Relation Age of Onset  . Heart disease Mother   . Hypertension Mother   . Asthma Father   . Emphysema Father   . Heart disease Brother     History   Social History  . Marital Status: Married    Spouse Name: N/A    Number of Children: N/A  . Years of Education: N/A   Occupational History  . Retired     Network engineer   Social History Main Topics  . Smoking status: Current Every Day Smoker -- 1.00 packs/day    Types: Cigarettes  . Smokeless tobacco: Never Used     Comment: Less than 1 ppd.  . Alcohol Use: 4.2 oz/week    7 Glasses of wine per week  . Drug Use: No  . Sexual Activity: None   Other Topics  Concern  . None   Social History Narrative   Regular exercise:  3 x weekly   Caffeine Use:  occasional    Outpatient Encounter Prescriptions as of 11/13/2014  Medication Sig  . albuterol (PROVENTIL HFA;VENTOLIN HFA) 108 (90 BASE) MCG/ACT inhaler Inhale 2 puffs into the lungs every 4 (four) hours as needed for wheezing or shortness of breath.  Marland Kitchen amLODipine (NORVASC) 10 MG tablet Take 1 tablet (10 mg total) by mouth daily.  Marland Kitchen aspirin 81 MG tablet Take 81 mg by mouth daily.    Marland Kitchen CALCIUM-MAGNESIUM-ZINC PO Take by mouth daily.  . Cholecalciferol (VITAMIN D3) 2000 UNITS capsule Take 2,000 Units by mouth daily.  . Coenzyme Q10 (COQ10) 200 MG CAPS Take 1 capsule by mouth daily.    . Garlic Oil (ODORLESS GARLIC) 161 MG TABS Take 1 tablet by mouth daily.    Nyoka Cowden Tea 315 MG CAPS Take 1 capsule by mouth daily.    Marland Kitchen levothyroxine (SYNTHROID, LEVOTHROID) 50 MCG tablet 1/2 tablet once a day x2 week, then 1 tablet once daily  .  Multiple Vitamin (MULTIVITAMIN) tablet Take 1 tablet by mouth daily.    . OMEGA 3 1000 MG CAPS Take 1 capsule by mouth 2 (two) times daily.    . Probiotic Product (PROBIOTIC DAILY PO) Take by mouth daily.  . ranitidine (ZANTAC) 150 MG tablet Take 150 mg by mouth 2 (two) times daily.  . simvastatin (ZOCOR) 20 MG tablet Take 0.5 tablets (10 mg total) by mouth daily.  . Thiamine HCl (VITAMIN B-1) 100 MG tablet Take 100 mg by mouth daily.    Marland Kitchen tiotropium (SPIRIVA HANDIHALER) 18 MCG inhalation capsule Place 1 capsule (18 mcg total) into inhaler and inhale daily.  . valsartan-hydrochlorothiazide (DIOVAN-HCT) 160-25 MG per tablet Take 1 tablet by mouth daily.  . Vitamin Mixture (ESTER-C) 500-60 MG TABS Take 1 tablet by mouth daily.    . Albuterol Sulfate (PROAIR RESPICLICK) 496 (90 BASE) MCG/ACT AEPB Inhale 2 puffs into the lungs every 6 (six) hours as needed (wheezing).  . [EXPIRED] albuterol (PROVENTIL) (2.5 MG/3ML) 0.083% nebulizer solution 2.5 mg     EXAM:  BP 128/50 mmHg   Pulse 88  Temp(Src) 97.9 F (36.6 C) (Oral)  Wt 128 lb 8 oz (58.287 kg)  SpO2 94%  Body mass index is 22.05 kg/(m^2).  GENERAL: vitals reviewed and listed above, alert, oriented, appears well hydrated and in no acute distressmild hoarse non toxic nl respi at rest here with sig other HEENT: atraumatic, conjunctiva  clear, no obvious abnormalities on inspection of external nose and earstms clear mild wax  OP : no lesion edema or exudate  Nose clear dc face non tender  NECK: no obvious masses on inspection palpation  No adenopathy felt  LUNGS:   bs =   Dec bs  Few scattered mid chest and aterior  Exp wheeze norales or rhonchi,  CV: HRRR, no clubbing cyanosis or  peripheral edema nl cap refill  MS: moves all extremities without noticeable focal  abnormality PSYCH: pleasant and cooperative, no obvious depression or anxiety After nebulizer  Wheezes are cleared and good air movement   ASSESSMENT AND PLAN:  Discussed the following assessment and plan:  Wheezing-associated respiratory infection (WARI) - Plan: albuterol (PROVENTIL) (2.5 MG/3ML) 0.083% nebulizer solution 2.5 mg  COPD exacerbation - poss no sign bacteria infectionat this time add proair and low threshol to add antib and or pred  hasnt had x ray in   ehr. - Plan: albuterol (PROVENTIL) (2.5 MG/3ML) 0.083% nebulizer solution 2.5 mg  Acute upper respiratory infection of multiple sites - Plan: albuterol (PROVENTIL) (2.5 MG/3ML) 0.083% nebulizer solution 2.5 mg  Shortness of breath - better  TOBACCO ABUSE - hasnt tried to quit  counseled about lung health and tobacco advise stop Disc call for alarm sx and fu   Expectant management.  -Patient advised to return or notify health care team  if symptoms worsen ,persist or new concerns arise.  Patient Instructions  This actgs like a viral respiratory infection with secondary wheezing . No obv bacterial infection at this time . Can use the proair  Up to every 6 hours as needed . If getting  fever  Thick discolored phelgm or blood and getting worse contact office.  Illness may last  2 weeks   But usually a lot better after the first week.  Sometimes we add prednisone also if needed.  Stopping tobacco  Smoking is the best thing you can do for your lungs and prevent worsening of  Your lung disease.  Can use saline  Afrin nose spray for no loinger than 3 days  At a time   Sudafed if blood pressure is ok .    Chronic Obstructive Pulmonary Disease Chronic obstructive pulmonary disease (COPD) is a common lung condition in which airflow from the lungs is limited. COPD is a general term that can be used to describe many different lung problems that limit airflow, including both chronic bronchitis and emphysema. If you have COPD, your lung function will probably never return to normal, but there are measures you can take to improve lung function and make yourself feel better.  CAUSES   Smoking (common).   Exposure to secondhand smoke.   Genetic problems.  Chronic inflammatory lung diseases or recurrent infections. SYMPTOMS   Shortness of breath, especially with physical activity.   Deep, persistent (chronic) cough with a large amount of thick mucus.   Wheezing.   Rapid breaths (tachypnea).   Gray or bluish discoloration (cyanosis) of the skin, especially in fingers, toes, or lips.   Fatigue.   Weight loss.   Frequent infections or episodes when breathing symptoms become much worse (exacerbations).   Chest tightness. DIAGNOSIS  Your health care provider will take a medical history and perform a physical examination to make the initial diagnosis. Additional tests for COPD may include:   Lung (pulmonary) function tests.  Chest X-ray.  CT scan.  Blood tests. TREATMENT  Treatment available to help you feel better when you have COPD includes:   Inhaler and nebulizer medicines. These help manage the symptoms of COPD and make your breathing more  comfortable.  Supplemental oxygen. Supplemental oxygen is only helpful if you have a low oxygen level in your blood.   Exercise and physical activity. These are beneficial for nearly all people with COPD. Some people may also benefit from a pulmonary rehabilitation program. HOME CARE INSTRUCTIONS   Take all medicines (inhaled or pills) as directed by your health care provider.  Avoid over-the-counter medicines or cough syrups that dry up your airway (such as antihistamines) and slow down the elimination of secretions unless instructed otherwise by your health care provider.   If you are a smoker, the most important thing that you can do is stop smoking. Continuing to smoke will cause further lung damage and breathing trouble. Ask your health care provider for help with quitting smoking. He or she can direct you to community resources or hospitals that provide support.  Avoid exposure to irritants such as smoke, chemicals, and fumes that aggravate your breathing.  Use oxygen therapy and pulmonary rehabilitation if directed by your health care provider. If you require home oxygen therapy, ask your health care provider whether you should purchase a pulse oximeter to measure your oxygen level at home.   Avoid contact with individuals who have a contagious illness.  Avoid extreme temperature and humidity changes.  Eat healthy foods. Eating smaller, more frequent meals and resting before meals may help you maintain your strength.  Stay active, but balance activity with periods of rest. Exercise and physical activity will help you maintain your ability to do things you want to do.  Preventing infection and hospitalization is very important when you have COPD. Make sure to receive all the vaccines your health care provider recommends, especially the pneumococcal and influenza vaccines. Ask your health care provider whether you need a pneumonia vaccine.  Learn and use relaxation techniques to  manage stress.  Learn and use controlled breathing techniques as directed by your health care provider. Controlled breathing  techniques include:   Pursed lip breathing. Start by breathing in (inhaling) through your nose for 1 second. Then, purse your lips as if you were going to whistle and breathe out (exhale) through the pursed lips for 2 seconds.   Diaphragmatic breathing. Start by putting one hand on your abdomen just above your waist. Inhale slowly through your nose. The hand on your abdomen should move out. Then purse your lips and exhale slowly. You should be able to feel the hand on your abdomen moving in as you exhale.   Learn and use controlled coughing to clear mucus from your lungs. Controlled coughing is a series of short, progressive coughs. The steps of controlled coughing are:  1. Lean your head slightly forward.  2. Breathe in deeply using diaphragmatic breathing.  3. Try to hold your breath for 3 seconds.  4. Keep your mouth slightly open while coughing twice.  5. Spit any mucus out into a tissue.  6. Rest and repeat the steps once or twice as needed. SEEK MEDICAL CARE IF:   You are coughing up more mucus than usual.   There is a change in the color or thickness of your mucus.   Your breathing is more labored than usual.   Your breathing is faster than usual.  SEEK IMMEDIATE MEDICAL CARE IF:   You have shortness of breath while you are resting.   You have shortness of breath that prevents you from:  Being able to talk.   Performing your usual physical activities.   You have chest pain lasting longer than 5 minutes.   Your skin color is more cyanotic than usual.  You measure low oxygen saturations for longer than 5 minutes with a pulse oximeter. MAKE SURE YOU:   Understand these instructions.  Will watch your condition.  Will get help right away if you are not doing well or get worse. Document Released: 07/01/2005 Document Revised:  02/05/2014 Document Reviewed: 05/18/2013 [facility] Patient Information 2015 [place], [place]. This information is not intended to replace advice given to you by your health care provider. Make sure you discuss any questions you have with your health care provider. Sinusitis Sinusitis is redness, soreness, and inflammation of the paranasal sinuses. Paranasal sinuses are air pockets within the bones of your face (beneath the eyes, the middle of the forehead, or above the eyes). In healthy paranasal sinuses, mucus is able to drain out, and air is able to circulate through them by way of your nose. However, when your paranasal sinuses are inflamed, mucus and air can become trapped. This can allow bacteria and other germs to grow and cause infection. Sinusitis can develop quickly and last only a short time (acute) or continue over a long period (chronic). Sinusitis that lasts for more than 12 weeks is considered chronic.  CAUSES  Causes of sinusitis include:  Allergies.  Structural abnormalities, such as displacement of the cartilage that separates your nostrils (deviated septum), which can decrease the air flow through your nose and sinuses and affect sinus drainage.  Functional abnormalities, such as when the small hairs (cilia) that line your sinuses and help remove mucus do not work properly or are not present. SIGNS AND SYMPTOMS  Symptoms of acute and chronic sinusitis are the same. The primary symptoms are pain and pressure around the affected sinuses. Other symptoms include:  Upper toothache.  Earache.  Headache.  Bad breath.  Decreased sense of smell and taste.  A cough, which worsens when you are lying flat.  Fatigue.  Fever.  Thick drainage from your nose, which often is green and may contain pus (purulent).  Swelling and warmth over the affected sinuses. DIAGNOSIS  Your health care provider will perform a physical exam. During the exam, your health care provider may:  Look  in your nose for signs of abnormal growths in your nostrils (nasal polyps).  Tap over the affected sinus to check for signs of infection.  View the inside of your sinuses (endoscopy) using an imaging device that has a light attached (endoscope). If your health care provider suspects that you have chronic sinusitis, one or more of the following tests may be recommended:  Allergy tests.  Nasal culture. A sample of mucus is taken from your nose, sent to a lab, and screened for bacteria.  Nasal cytology. A sample of mucus is taken from your nose and examined by your health care provider to determine if your sinusitis is related to an allergy. TREATMENT  Most cases of acute sinusitis are related to a viral infection and will resolve on their own within 10 days. Sometimes medicines are prescribed to help relieve symptoms (pain medicine, decongestants, nasal steroid sprays, or saline sprays).  However, for sinusitis related to a bacterial infection, your health care provider will prescribe antibiotic medicines. These are medicines that will help kill the bacteria causing the infection.  Rarely, sinusitis is caused by a fungal infection. In theses cases, your health care provider will prescribe antifungal medicine. For some cases of chronic sinusitis, surgery is needed. Generally, these are cases in which sinusitis recurs more than 3 times per year, despite other treatments. HOME CARE INSTRUCTIONS   Drink plenty of water. Water helps thin the mucus so your sinuses can drain more easily.  Use a humidifier.  Inhale steam 3 to 4 times a day (for example, sit in the bathroom with the shower running).  Apply a warm, moist washcloth to your face 3 to 4 times a day, or as directed by your health care provider.  Use saline nasal sprays to help moisten and clean your sinuses.  Take medicines only as directed by your health care provider.  If you were prescribed either an antibiotic or antifungal  medicine, finish it all even if you start to feel better. SEEK IMMEDIATE MEDICAL CARE IF: 7. You have increasing pain or severe headaches. 8. You have nausea, vomiting, or drowsiness. 9. You have swelling around your face. 10. You have vision problems. 11. You have a stiff neck. 12. You have difficulty breathing. MAKE SURE YOU:   Understand these instructions.  Will watch your condition.  Will get help right away if you are not doing well or get worse. Document Released: 09/21/2005 Document Revised: 02/05/2014 Document Reviewed: 10/06/2011 [facility] Patient Information 2015 [place], [place]. This information is not intended to replace advice given to you by your health care provider. Make sure you discuss any questions you have with your health care provider.       Standley Brooking. Haile Toppins M.D.

## 2014-12-03 ENCOUNTER — Other Ambulatory Visit (INDEPENDENT_AMBULATORY_CARE_PROVIDER_SITE_OTHER): Payer: Medicare HMO

## 2014-12-03 DIAGNOSIS — E038 Other specified hypothyroidism: Secondary | ICD-10-CM

## 2014-12-03 LAB — TSH: TSH: 4.53 u[IU]/mL — ABNORMAL HIGH (ref 0.35–4.50)

## 2014-12-05 ENCOUNTER — Encounter: Payer: Self-pay | Admitting: Internal Medicine

## 2014-12-05 ENCOUNTER — Ambulatory Visit (INDEPENDENT_AMBULATORY_CARE_PROVIDER_SITE_OTHER): Payer: Medicare HMO | Admitting: Internal Medicine

## 2014-12-05 VITALS — BP 124/70 | HR 84 | Temp 98.4°F | Ht 64.0 in | Wt 129.0 lb

## 2014-12-05 DIAGNOSIS — E039 Hypothyroidism, unspecified: Secondary | ICD-10-CM | POA: Insufficient documentation

## 2014-12-05 DIAGNOSIS — J449 Chronic obstructive pulmonary disease, unspecified: Secondary | ICD-10-CM

## 2014-12-05 MED ORDER — LEVOTHYROXINE SODIUM 75 MCG PO TABS: 75.0000 ug | ORAL_TABLET | Freq: Every day | ORAL | Status: DC

## 2014-12-05 MED ORDER — VALSARTAN-HYDROCHLOROTHIAZIDE 160-25 MG PO TABS: 1.0000 | ORAL_TABLET | Freq: Every day | ORAL | Status: DC

## 2014-12-05 MED ORDER — AMLODIPINE BESYLATE 10 MG PO TABS: 10.0000 mg | ORAL_TABLET | Freq: Every day | ORAL | Status: DC

## 2014-12-05 MED ORDER — TIOTROPIUM BROMIDE MONOHYDRATE 18 MCG IN CAPS: 18.0000 ug | ORAL_CAPSULE | Freq: Every day | RESPIRATORY_TRACT | Status: DC

## 2014-12-05 MED ORDER — SIMVASTATIN 20 MG PO TABS: 10.0000 mg | ORAL_TABLET | Freq: Every day | ORAL | Status: DC

## 2014-12-05 NOTE — Assessment & Plan Note (Signed)
Patient likely has autoimmune thyroid disease. This was discussed with patient. Patient feeling better on thyroid replacement. She is not at goal. Increase levothyroxine 75 g. Repeat TSH in 2 months.

## 2014-12-05 NOTE — Progress Notes (Signed)
Subjective:    Patient ID: Christine Keller, female    DOB: August 17, 1941, 74 y.o.   MRN: [id number]  HPI  74 year old white female with history of hypertension, hyperlipidemia and chronic tobacco use for follow-up. She seen approximately 1 month ago for mild COPD exacerbation. Patient reports her symptoms improved.  She used her inhaler once or twice. She reports decreasing tobacco use. She is smoking less than half pack per day.  We reviewed patient's blood work. Patient's TSH elevated and indicates hypothyroidism.  She is symptomatic. Patient started on levothyroxine and is currently taking 50 g once daily.  Lab Results  Component Value Date   TSH 4.53* 12/03/2014      Review of Systems Fatigue, hair loss    Past Medical History  Diagnosis Date  . Hyperlipidemia   . Hypertension   . Thyroid nodule   . Tobacco abuse   . COPD (chronic obstructive pulmonary disease) 1.31.11    PFT FEV1 1.19(56%), FVC 2.68(90%), FEV1% 44, TLC 5.80(115%), DLCO 52%  . Osteoporosis     OSTEOPENIA  . Bilateral carpal tunnel syndrome   . Ovarian tumor 2014    Testosterone secreting ovarain tumor    History   Social History  . Marital Status: Married    Spouse Name: N/A  . Number of Children: N/A  . Years of Education: N/A   Occupational History  . Retired     Network engineer   Social History Main Topics  . Smoking status: Current Every Day Smoker -- 1.00 packs/day    Types: Cigarettes  . Smokeless tobacco: Never Used     Comment: Less than 1 ppd.  . Alcohol Use: 4.2 oz/week    7 Glasses of wine per week  . Drug Use: No  . Sexual Activity: Not on file   Other Topics Concern  . Not on file   Social History Narrative   Regular exercise:  3 x weekly   Caffeine Use:  occasional    Past Surgical History  Procedure Laterality Date  . Tonsillectomy    . Carpal tunnel release  05/26/2012    Procedure: CARPAL TUNNEL RELEASE;  Surgeon: Cammie Sickle., MD;  Location: [place];  Service: Orthopedics;  Laterality: Left;  Injection of Right thumb Carpal-Metacarpal joint also  . Ulnar nerve transposition  05/26/2012    Procedure: ULNAR NERVE DECOMPRESSION/TRANSPOSITION;  Surgeon: Cammie Sickle., MD;  Location: [place];  Service: Orthopedics;  Laterality: Left;  . Carpal tunnel release  08/16/2012    Procedure: CARPAL TUNNEL RELEASE;  Surgeon: Cammie Sickle., MD;  Location: [place];  Service: Orthopedics;  Laterality: Right;  Inject Left Thumb Carpal Metacarpal Joint  . Steriod injection  08/16/2012    Procedure: STEROID INJECTION;  Surgeon: Cammie Sickle., MD;  Location: [place];  Service: Orthopedics;  Laterality: Left;  Inject Left Thumb Carpal Metacarpal Joint  . Other surgical history  12/14/12    bilateral oopherectomy [facility]    Family History  Problem Relation Age of Onset  . Heart disease Mother   . Hypertension Mother   . Asthma Father   . Emphysema Father   . Heart disease Brother     Allergies  Allergen Reactions  . Codeine     REACTION: hallucinations  . Niacin     REACTION: rash    Current Outpatient Prescriptions on File Prior to Visit  Medication Sig Dispense Refill  .  albuterol (PROVENTIL HFA;VENTOLIN HFA) 108 (90 BASE) MCG/ACT inhaler Inhale 2 puffs into the lungs every 4 (four) hours as needed for wheezing or shortness of breath.    . Albuterol Sulfate (PROAIR RESPICLICK) 263 (90 BASE) MCG/ACT AEPB Inhale 2 puffs into the lungs every 6 (six) hours as needed (wheezing). 1 each 2  . amLODipine (NORVASC) 10 MG tablet Take 1 tablet (10 mg total) by mouth daily. 90 tablet 3  . aspirin 81 MG tablet Take 81 mg by mouth daily.      Marland Kitchen CALCIUM-MAGNESIUM-ZINC PO Take by mouth daily.    . Cholecalciferol (VITAMIN D3) 2000 UNITS capsule Take 2,000 Units by mouth daily.    . Coenzyme Q10 (COQ10) 200 MG CAPS Take 1 capsule by mouth daily.      . Garlic Oil (ODORLESS GARLIC) 335 MG  TABS Take 1 tablet by mouth daily.      Nyoka Cowden Tea 315 MG CAPS Take 1 capsule by mouth daily.      Marland Kitchen levothyroxine (SYNTHROID, LEVOTHROID) 50 MCG tablet 1/2 tablet once a day x2 week, then 1 tablet once daily 30 tablet 3  . Multiple Vitamin (MULTIVITAMIN) tablet Take 1 tablet by mouth daily.      . OMEGA 3 1000 MG CAPS Take 1 capsule by mouth 2 (two) times daily.      . Probiotic Product (PROBIOTIC DAILY PO) Take by mouth daily.    . ranitidine (ZANTAC) 150 MG tablet Take 150 mg by mouth 2 (two) times daily.    . simvastatin (ZOCOR) 20 MG tablet Take 0.5 tablets (10 mg total) by mouth daily. 90 tablet 3  . Thiamine HCl (VITAMIN B-1) 100 MG tablet Take 100 mg by mouth daily.      Marland Kitchen tiotropium (SPIRIVA HANDIHALER) 18 MCG inhalation capsule Place 1 capsule (18 mcg total) into inhaler and inhale daily. 90 capsule 3  . valsartan-hydrochlorothiazide (DIOVAN-HCT) 160-25 MG per tablet Take 1 tablet by mouth daily. 90 tablet 3  . Vitamin Mixture (ESTER-C) 500-60 MG TABS Take 1 tablet by mouth daily.       No current facility-administered medications on file prior to visit.    BP 124/70 mmHg  Pulse 84  Temp(Src) 98.4 F (36.9 C) (Oral)  Ht 5\' 4"  (1.626 m)  Wt 129 lb (58.514 kg)  BMI 22.13 kg/m2    Objective:   Physical Exam  Constitutional: She is oriented to person, place, and time. She appears well-developed and well-nourished. No distress.  HENT:  Head: Normocephalic and atraumatic.  Neck: Neck supple.  Cardiovascular: Normal rate, regular rhythm and normal heart sounds.   No murmur heard. Pulmonary/Chest: Effort normal. She has no wheezes.  Prolonged expiration  Neurological: She is alert and oriented to person, place, and time. No cranial nerve deficit.  Psychiatric: Her behavior is normal.          Assessment & Plan:

## 2014-12-05 NOTE — Assessment & Plan Note (Signed)
Patient had recent minor COPD exacerbation. Her symptoms resolved.  She has decreased tobacco use to less than half pack per day. Complete smoking cessation strongly encouraged.

## 2014-12-05 NOTE — Patient Instructions (Signed)
Please complete the following lab tests before your next follow up appointment: TSH - 244.9

## 2014-12-05 NOTE — Progress Notes (Signed)
Pre visit review using our clinic review tool, if applicable. No additional management support is needed unless otherwise documented below in the visit note. 

## 2015-01-14 ENCOUNTER — Encounter: Payer: Self-pay | Admitting: Internal Medicine

## 2015-01-28 ENCOUNTER — Other Ambulatory Visit (INDEPENDENT_AMBULATORY_CARE_PROVIDER_SITE_OTHER): Payer: Medicare HMO

## 2015-01-28 DIAGNOSIS — E039 Hypothyroidism, unspecified: Secondary | ICD-10-CM

## 2015-01-28 LAB — TSH: TSH: 2.14 u[IU]/mL (ref 0.35–4.50)

## 2015-01-31 ENCOUNTER — Other Ambulatory Visit: Payer: Self-pay | Admitting: *Deleted

## 2015-01-31 MED ORDER — LEVOTHYROXINE SODIUM 75 MCG PO TABS: 75.0000 ug | ORAL_TABLET | Freq: Every day | ORAL | Status: DC

## 2015-02-04 ENCOUNTER — Ambulatory Visit: Payer: Medicare HMO | Admitting: Internal Medicine

## 2015-02-05 ENCOUNTER — Telehealth: Payer: Self-pay | Admitting: Internal Medicine

## 2015-02-05 NOTE — Telephone Encounter (Signed)
I called patient to r/s her appointment on 02/08/15 due to Dr. Lora Havens absence and patient is requesting to see Dr. Regis Bill.  Please advise if it is okay to use SDA slot on 02/08/15.

## 2015-02-06 NOTE — Telephone Encounter (Signed)
?   If for acute visit? Why coming in ?   If for Chronic disease management   Should really be seen by dr Shawna Orleans however see what is needed in the interim and could help out.

## 2015-02-07 ENCOUNTER — Encounter: Payer: Self-pay | Admitting: Adult Health

## 2015-02-07 ENCOUNTER — Ambulatory Visit (INDEPENDENT_AMBULATORY_CARE_PROVIDER_SITE_OTHER): Payer: Medicare HMO | Admitting: Adult Health

## 2015-02-07 VITALS — BP 130/82 | HR 84 | Temp 98.4°F | Wt 128.1 lb

## 2015-02-07 DIAGNOSIS — K5900 Constipation, unspecified: Secondary | ICD-10-CM | POA: Diagnosis not present

## 2015-02-07 DIAGNOSIS — E039 Hypothyroidism, unspecified: Secondary | ICD-10-CM | POA: Diagnosis not present

## 2015-02-07 DIAGNOSIS — Z09 Encounter for follow-up examination after completed treatment for conditions other than malignant neoplasm: Secondary | ICD-10-CM

## 2015-02-07 NOTE — Patient Instructions (Signed)
Please follow up in six weeks to have your thyroid level recheck. Use a fiber supplement for your constipation. It was great meeting you today.

## 2015-02-07 NOTE — Progress Notes (Signed)
   Subjective:    Patient ID: Christine Keller, female    DOB: 1941-03-10, 74 y.o.   MRN: [id number]  HPI  Here for follow up regarding most recent TSH. She also has concern due to constipation  Review of Systems  Gastrointestinal: Positive for constipation. Negative for abdominal pain, blood in stool, abdominal distention and anal bleeding.  All other systems reviewed and are negative.      Objective:   Physical Exam  Constitutional: She is oriented to person, place, and time. She appears well-developed and well-nourished. No distress.  Abdominal: Soft. Bowel sounds are normal. She exhibits no distension and no mass. There is no tenderness. There is no rebound and no guarding.  Musculoskeletal: Normal range of motion.  Neurological: She is alert and oriented to person, place, and time.  Skin: Skin is warm and dry. No rash noted. She is not diaphoretic. No erythema. No pallor.  Psychiatric: She has a normal mood and affect. Her behavior is normal. Judgment and thought content normal.  Nursing note and vitals reviewed.         Assessment & Plan:  1. Hypothyroidism, unspecified hypothyroidism type - TSH 2.14. No change in medication dosage.  - TSH; Future  2. Follow up - TSH at goal. Continue with current regimen.   3. Constipation, unspecified constipation type - Add fiber to diet and drink more water.  - Follow up if no improvement.

## 2015-02-07 NOTE — Progress Notes (Signed)
Pre visit review using our clinic review tool, if applicable. No additional management support is needed unless otherwise documented below in the visit note. 

## 2015-02-07 NOTE — Telephone Encounter (Signed)
Thank you for your help with this!

## 2015-02-08 ENCOUNTER — Ambulatory Visit: Payer: Medicare HMO | Admitting: Internal Medicine

## 2015-03-28 ENCOUNTER — Other Ambulatory Visit: Payer: Medicare HMO

## 2015-03-29 ENCOUNTER — Other Ambulatory Visit (INDEPENDENT_AMBULATORY_CARE_PROVIDER_SITE_OTHER): Payer: Medicare HMO

## 2015-03-29 ENCOUNTER — Encounter: Payer: Self-pay | Admitting: Adult Health

## 2015-03-29 DIAGNOSIS — E039 Hypothyroidism, unspecified: Secondary | ICD-10-CM

## 2015-03-29 LAB — TSH: TSH: 1.31 u[IU]/mL (ref 0.35–4.50)

## 2015-04-24 ENCOUNTER — Encounter: Payer: Self-pay | Admitting: Family Medicine

## 2015-04-24 ENCOUNTER — Ambulatory Visit (INDEPENDENT_AMBULATORY_CARE_PROVIDER_SITE_OTHER): Payer: Medicare HMO | Admitting: Family Medicine

## 2015-04-24 VITALS — BP 130/80 | HR 90 | Temp 98.4°F | Wt 123.0 lb

## 2015-04-24 DIAGNOSIS — R05 Cough: Secondary | ICD-10-CM | POA: Diagnosis not present

## 2015-04-24 DIAGNOSIS — R059 Cough, unspecified: Secondary | ICD-10-CM

## 2015-04-24 MED ORDER — DOXYCYCLINE HYCLATE 100 MG PO CAPS: 100.0000 mg | ORAL_CAPSULE | Freq: Two times a day (BID) | ORAL | Status: DC

## 2015-04-24 NOTE — Patient Instructions (Signed)
Consider starting the antibiotic if you have 1) increased mucus production 2) color changes to sputum or 3) increasing shortness of breath

## 2015-04-24 NOTE — Progress Notes (Signed)
Pre visit review using our clinic review tool, if applicable. No additional management support is needed unless otherwise documented below in the visit note. 

## 2015-04-24 NOTE — Progress Notes (Signed)
Subjective:    Patient ID: Christine Keller, female    DOB: [birth year], 73 y.o.   MRN: [id number]  HPI Acute visit. Patient is longtime smoker with one-day history of some increased cough. She takes Spiriva for COPD. Her cough has been dry. No increased production of mucus and no color changes. No fevers or chills. She has rescue inhaler with albuterol which she has not taken. She is not aware of any wheezing. No increased dyspnea on exertion above her baseline thus far. She is concerned because her husband is having nephrectomy tomorrow in [facility]  Past Medical History  Diagnosis Date  . Hyperlipidemia   . Hypertension   . Thyroid nodule   . Tobacco abuse   . COPD (chronic obstructive pulmonary disease) 1.31.11    PFT FEV1 1.19(56%), FVC 2.68(90%), FEV1% 44, TLC 5.80(115%), DLCO 52%  . Osteoporosis     OSTEOPENIA  . Bilateral carpal tunnel syndrome   . Ovarian tumor 2014    Testosterone secreting ovarain tumor   Past Surgical History  Procedure Laterality Date  . Tonsillectomy    . Carpal tunnel release  05/26/2012    Procedure: CARPAL TUNNEL RELEASE;  Surgeon: Cammie Sickle., MD;  Location: [place];  Service: Orthopedics;  Laterality: Left;  Injection of Right thumb Carpal-Metacarpal joint also  . Ulnar nerve transposition  05/26/2012    Procedure: ULNAR NERVE DECOMPRESSION/TRANSPOSITION;  Surgeon: Cammie Sickle., MD;  Location: [place];  Service: Orthopedics;  Laterality: Left;  . Carpal tunnel release  08/16/2012    Procedure: CARPAL TUNNEL RELEASE;  Surgeon: Cammie Sickle., MD;  Location: [place];  Service: Orthopedics;  Laterality: Right;  Inject Left Thumb Carpal Metacarpal Joint  . Steriod injection  08/16/2012    Procedure: STEROID INJECTION;  Surgeon: Cammie Sickle., MD;  Location: [place];  Service: Orthopedics;  Laterality: Left;  Inject Left Thumb Carpal Metacarpal Joint  . Other  surgical history  12/14/12    bilateral oopherectomy [facility]    reports that she has been smoking Cigarettes.  She has been smoking about 1.00 pack per day. She has never used smokeless tobacco. She reports that she drinks about 4.2 oz of alcohol per week. She reports that she does not use illicit drugs. family history includes Asthma in her father; Emphysema in her father; Heart disease in her brother and mother; Hypertension in her mother. Allergies  Allergen Reactions  . Codeine     REACTION: hallucinations  . Niacin     REACTION: rash      Review of Systems  Constitutional: Negative for fever and chills.  HENT: Negative for congestion.   Respiratory: Positive for cough. Negative for shortness of breath and wheezing.   Cardiovascular: Negative for chest pain.       Objective:   Physical Exam  Constitutional: She appears well-developed.  HENT:  Right Ear: External ear normal.  Left Ear: External ear normal.  Mouth/Throat: Oropharynx is clear and moist.  Neck: Neck supple.  Cardiovascular: Normal rate and regular rhythm.   Pulmonary/Chest: Effort normal and breath sounds normal. No respiratory distress. She has no wheezes. She has no rales.  Musculoskeletal: She exhibits no edema.  Lymphadenopathy:    She has no cervical adenopathy.          Assessment & Plan:  Cough. She has underlying COPD. Pulse oximetry 94% which apparently is near her baseline. No respiratory distress. We  recommended starting doxycycline 100 mg twice a day for 10 days if she has any increased shortness of breath, mucus color changes, or increased volume of mucus above baseline.

## 2015-06-18 ENCOUNTER — Ambulatory Visit: Payer: Medicare HMO

## 2015-07-16 ENCOUNTER — Other Ambulatory Visit: Payer: Medicare HMO

## 2015-07-19 ENCOUNTER — Other Ambulatory Visit (INDEPENDENT_AMBULATORY_CARE_PROVIDER_SITE_OTHER): Payer: Medicare HMO

## 2015-07-19 DIAGNOSIS — Z0000 Encounter for general adult medical examination without abnormal findings: Secondary | ICD-10-CM

## 2015-07-19 DIAGNOSIS — E041 Nontoxic single thyroid nodule: Secondary | ICD-10-CM | POA: Diagnosis not present

## 2015-07-19 DIAGNOSIS — I10 Essential (primary) hypertension: Secondary | ICD-10-CM

## 2015-07-19 DIAGNOSIS — E785 Hyperlipidemia, unspecified: Secondary | ICD-10-CM

## 2015-07-19 LAB — CBC WITH DIFFERENTIAL/PLATELET
Basophils Absolute: 0 10*3/uL (ref 0.0–0.1)
Basophils Relative: 0.5 % (ref 0.0–3.0)
Eosinophils Absolute: 0.7 10*3/uL (ref 0.0–0.7)
Eosinophils Relative: 8.1 % — ABNORMAL HIGH (ref 0.0–5.0)
HCT: 44.7 % (ref 36.0–46.0)
Hemoglobin: 15.2 g/dL — ABNORMAL HIGH (ref 12.0–15.0)
Lymphocytes Relative: 32.3 % (ref 12.0–46.0)
Lymphs Abs: 2.9 10*3/uL (ref 0.7–4.0)
MCHC: 33.9 g/dL (ref 30.0–36.0)
MCV: 98.3 fl (ref 78.0–100.0)
Monocytes Absolute: 0.7 10*3/uL (ref 0.1–1.0)
Monocytes Relative: 7.5 % (ref 3.0–12.0)
Neutro Abs: 4.7 10*3/uL (ref 1.4–7.7)
Neutrophils Relative %: 51.6 % (ref 43.0–77.0)
Platelets: 285 10*3/uL (ref 150.0–400.0)
RBC: 4.55 Mil/uL (ref 3.87–5.11)
RDW: 12.9 % (ref 11.5–15.5)
WBC: 9.1 10*3/uL (ref 4.0–10.5)

## 2015-07-19 LAB — LIPID PANEL
Cholesterol: 168 mg/dL (ref 0–200)
HDL: 56 mg/dL (ref >39.00)
LDL Cholesterol: 89 mg/dL (ref 0–99)
NonHDL: 111.74
Total CHOL/HDL Ratio: 3
Triglycerides: 116 mg/dL (ref 0.0–149.0)
VLDL: 23.2 mg/dL (ref 0.0–40.0)

## 2015-07-19 LAB — BASIC METABOLIC PANEL
BUN: 17 mg/dL (ref 6–23)
CO2: 29 mEq/L (ref 19–32)
Calcium: 10.3 mg/dL (ref 8.4–10.5)
Chloride: 103 mEq/L (ref 96–112)
Creatinine, Ser: 0.77 mg/dL (ref 0.40–1.20)
GFR: 77.62 mL/min (ref >60.00)
Glucose, Bld: 103 mg/dL — ABNORMAL HIGH (ref 70–99)
Potassium: 4.8 mEq/L (ref 3.5–5.1)
Sodium: 141 mEq/L (ref 135–145)

## 2015-07-19 LAB — HEPATIC FUNCTION PANEL
ALT: 20 U/L (ref 0–35)
AST: 25 U/L (ref 0–37)
Albumin: 4.3 g/dL (ref 3.5–5.2)
Alkaline Phosphatase: 80 U/L (ref 39–117)
Bilirubin, Direct: 0.1 mg/dL (ref 0.0–0.3)
Total Bilirubin: 0.6 mg/dL (ref 0.2–1.2)
Total Protein: 7.1 g/dL (ref 6.0–8.3)

## 2015-07-19 LAB — TSH: TSH: 3.07 u[IU]/mL (ref 0.35–4.50)

## 2015-08-09 ENCOUNTER — Other Ambulatory Visit: Payer: Self-pay | Admitting: Internal Medicine

## 2015-08-09 ENCOUNTER — Other Ambulatory Visit: Payer: Self-pay | Admitting: Adult Health

## 2015-08-09 DIAGNOSIS — Z139 Encounter for screening, unspecified: Secondary | ICD-10-CM

## 2015-08-14 ENCOUNTER — Encounter: Payer: Self-pay | Admitting: Adult Health

## 2015-08-14 ENCOUNTER — Ambulatory Visit (INDEPENDENT_AMBULATORY_CARE_PROVIDER_SITE_OTHER): Payer: Medicare HMO | Admitting: Adult Health

## 2015-08-14 VITALS — BP 140/70 | HR 91 | Temp 98.1°F | Ht 64.0 in | Wt 124.8 lb

## 2015-08-14 DIAGNOSIS — I10 Essential (primary) hypertension: Secondary | ICD-10-CM

## 2015-08-14 DIAGNOSIS — Z0000 Encounter for general adult medical examination without abnormal findings: Secondary | ICD-10-CM

## 2015-08-14 DIAGNOSIS — E785 Hyperlipidemia, unspecified: Secondary | ICD-10-CM | POA: Diagnosis not present

## 2015-08-14 DIAGNOSIS — Z760 Encounter for issue of repeat prescription: Secondary | ICD-10-CM

## 2015-08-14 MED ORDER — LEVOTHYROXINE SODIUM 75 MCG PO TABS: 75.0000 ug | ORAL_TABLET | Freq: Every day | ORAL | Status: DC

## 2015-08-14 MED ORDER — VALSARTAN-HYDROCHLOROTHIAZIDE 160-25 MG PO TABS: 1.0000 | ORAL_TABLET | Freq: Every day | ORAL | Status: DC

## 2015-08-14 MED ORDER — SIMVASTATIN 20 MG PO TABS: 10.0000 mg | ORAL_TABLET | Freq: Every day | ORAL | Status: DC

## 2015-08-14 MED ORDER — TIOTROPIUM BROMIDE MONOHYDRATE 18 MCG IN CAPS
18.0000 ug | ORAL_CAPSULE | Freq: Every day | RESPIRATORY_TRACT | Status: DC
Start: 2015-08-14 — End: 2015-08-14

## 2015-08-14 MED ORDER — AMLODIPINE BESYLATE 10 MG PO TABS: 10.0000 mg | ORAL_TABLET | Freq: Every day | ORAL | Status: DC

## 2015-08-14 MED ORDER — AMLODIPINE BESYLATE 10 MG PO TABS
10.0000 mg | ORAL_TABLET | Freq: Every day | ORAL | Status: DC
Start: 2015-08-14 — End: 2015-08-14

## 2015-08-14 MED ORDER — TIOTROPIUM BROMIDE MONOHYDRATE 18 MCG IN CAPS: 18.0000 ug | ORAL_CAPSULE | Freq: Every day | RESPIRATORY_TRACT | Status: DC

## 2015-08-14 NOTE — Progress Notes (Signed)
Subjective:    Patient ID: Christine Keller, female    DOB: May 12, 1941, 74 y.o.   MRN: [id number]  HPI Patient presents for yearly medicare wellness exam and physical . She history of essential hypertension, chronic tobacco use and COPD  Medicare questionnaire was completed.   She has not had any hospitalizations or emergency room visits in the past year. She has not seen any new subspecialists within the last year. No new surgeries within the last 1 year.  All immunizations and health maintenance protocols were reviewed with the patient and needed orders were placed.  Appropriate screening laboratory values were reviewed  with the patient including screening of hyperlipidemia, renal function and hepatic function.  Medication reconciliation,  past medical history, social history, problem list and allergies were reviewed in detail with the patient  Goals were established with regard to weight loss, exercise, and  diet in compliance with medications  End of life planning was discussed.- She has an advanced directives and living will.   Review of Systems  Constitutional: Negative.   HENT: Negative.   Eyes: Negative.   Respiratory: Negative.   Cardiovascular: Negative.   Endocrine: Negative.   Genitourinary: Negative.   Musculoskeletal: Negative.   Skin: Negative.   Allergic/Immunologic: Negative.   Neurological: Negative.   Hematological: Negative.   Psychiatric/Behavioral: Negative.   All other systems reviewed and are negative.  Past Medical History  Diagnosis Date  . Hyperlipidemia   . Hypertension   . Thyroid nodule   . Tobacco abuse   . COPD (chronic obstructive pulmonary disease) ([place]) 1.31.11    PFT FEV1 1.19(56%), FVC 2.68(90%), FEV1% 44, TLC 5.80(115%), DLCO 52%  . Osteoporosis     OSTEOPENIA  . Bilateral carpal tunnel syndrome   . Ovarian tumor 2014    Testosterone secreting ovarain tumor    Social History   Social History  . Marital Status: Married   Spouse Name: N/A  . Number of Children: N/A  . Years of Education: N/A   Occupational History  . Retired     Network engineer   Social History Main Topics  . Smoking status: Current Every Day Smoker -- 1.00 packs/day    Types: Cigarettes  . Smokeless tobacco: Never Used     Comment: Less than 1 ppd.  . Alcohol Use: 4.2 oz/week    7 Glasses of wine per week  . Drug Use: No  . Sexual Activity: Not on file   Other Topics Concern  . Not on file   Social History Narrative   Regular exercise:  3 x weekly   Caffeine Use:  occasional    Past Surgical History  Procedure Laterality Date  . Tonsillectomy    . Carpal tunnel release  05/26/2012    Procedure: CARPAL TUNNEL RELEASE;  Surgeon: Cammie Sickle., MD;  Location: [place];  Service: Orthopedics;  Laterality: Left;  Injection of Right thumb Carpal-Metacarpal joint also  . Ulnar nerve transposition  05/26/2012    Procedure: ULNAR NERVE DECOMPRESSION/TRANSPOSITION;  Surgeon: Cammie Sickle., MD;  Location: [place];  Service: Orthopedics;  Laterality: Left;  . Carpal tunnel release  08/16/2012    Procedure: CARPAL TUNNEL RELEASE;  Surgeon: Cammie Sickle., MD;  Location: [place];  Service: Orthopedics;  Laterality: Right;  Inject Left Thumb Carpal Metacarpal Joint  . Steriod injection  08/16/2012    Procedure: STEROID INJECTION;  Surgeon: Cammie Sickle., MD;  Location: [facility];  Service: Orthopedics;  Laterality: Left;  Inject Left Thumb Carpal Metacarpal Joint  . Other surgical history  12/14/12    bilateral oopherectomy [facility]    Family History  Problem Relation Age of Onset  . Heart disease Mother   . Hypertension Mother   . Asthma Father   . Emphysema Father   . Heart disease Brother     Allergies  Allergen Reactions  . Codeine     REACTION: hallucinations  . Niacin     REACTION: rash    Current Outpatient Prescriptions on File Prior to  Visit  Medication Sig Dispense Refill  . Albuterol Sulfate (PROAIR RESPICLICK) 619 (90 BASE) MCG/ACT AEPB Inhale 2 puffs into the lungs every 6 (six) hours as needed (wheezing). 1 each 2  . amLODipine (NORVASC) 10 MG tablet Take 1 tablet (10 mg total) by mouth daily. 90 tablet 3  . aspirin 81 MG tablet Take 81 mg by mouth daily.      Marland Kitchen CALCIUM-MAGNESIUM-ZINC PO Take by mouth daily.    . Cholecalciferol (VITAMIN D3) 2000 UNITS capsule Take 2,000 Units by mouth daily.    . Coenzyme Q10 (COQ10) 200 MG CAPS Take 1 capsule by mouth daily.      . Garlic Oil (ODORLESS GARLIC) 509 MG TABS Take 1 tablet by mouth daily.      Nyoka Cowden Tea 315 MG CAPS Take 1 capsule by mouth daily.      Marland Kitchen levothyroxine (SYNTHROID, LEVOTHROID) 75 MCG tablet Take 1 tablet (75 mcg total) by mouth daily before breakfast. 90 tablet 1  . Multiple Vitamin (MULTIVITAMIN) tablet Take 1 tablet by mouth daily.      . OMEGA 3 1000 MG CAPS Take 1 capsule by mouth 2 (two) times daily.      . Probiotic Product (PROBIOTIC DAILY PO) Take by mouth daily.    . ranitidine (ZANTAC) 150 MG tablet Take 150 mg by mouth 2 (two) times daily.    . simvastatin (ZOCOR) 20 MG tablet Take 0.5 tablets (10 mg total) by mouth daily. 90 tablet 3  . Thiamine HCl (VITAMIN B-1) 100 MG tablet Take 100 mg by mouth daily.      Marland Kitchen tiotropium (SPIRIVA HANDIHALER) 18 MCG inhalation capsule Place 1 capsule (18 mcg total) into inhaler and inhale daily. 90 capsule 3  . valsartan-hydrochlorothiazide (DIOVAN-HCT) 160-25 MG per tablet Take 1 tablet by mouth daily. 90 tablet 3  . Vitamin Mixture (ESTER-C) 500-60 MG TABS Take 1 tablet by mouth daily.       No current facility-administered medications on file prior to visit.    BP 140/70 mmHg  Pulse 91  Temp(Src) 98.1 F (36.7 C) (Oral)  Ht 5\' 4"  (1.626 m)  Wt 124 lb 12.8 oz (56.609 kg)  BMI 21.41 kg/m2       Objective:   Physical Exam  Constitutional: She is oriented to person, place, and time. She appears  well-developed and well-nourished.  HENT:  Head: Normocephalic and atraumatic.  Right Ear: External ear normal.  Left Ear: External ear normal.  Nose: Nose normal.  Mouth/Throat: Oropharynx is clear and moist. No oropharyngeal exudate.  Eyes: Conjunctivae and EOM are normal. Pupils are equal, round, and reactive to light. Right eye exhibits no discharge. Left eye exhibits no discharge. No scleral icterus.  Neck: Normal range of motion. Neck supple. No JVD present. No tracheal deviation present. No thyromegaly present.  Cardiovascular: Normal rate, regular rhythm, normal heart sounds and intact distal pulses.  Exam reveals no gallop and no friction rub.   No murmur heard. Pulmonary/Chest: Effort normal and breath sounds normal. No respiratory distress. She has no wheezes. She has no rales. She exhibits no tenderness.  Abdominal: Soft. Bowel sounds are normal. She exhibits no distension and no mass. There is no tenderness. There is no rebound and no guarding.  Genitourinary:   Breast Exam: No masses, lumps, dimpling or discharge noted.    Musculoskeletal: Normal range of motion. She exhibits no edema or tenderness.  Lymphadenopathy:    She has no cervical adenopathy.  Neurological: She is alert and oriented to person, place, and time.  Skin: Skin is warm and dry. No rash noted. No erythema. No pallor.  Psychiatric: She has a normal mood and affect. Her behavior is normal. Judgment and thought content normal.  Nursing note and vitals reviewed.      Assessment & Plan:  1. Routine general medical examination at a health care facility - Follow up in one year for next CPE - Follow up sooner if needed - EKG 12-Lead - Sinus Rhythm , rate 75 - She needs to quit smoking - Add Metamucil to her diet   2. Essential hypertension - Controlled - valsartan-hydrochlorothiazide (DIOVAN-HCT) 160-25 MG tablet; Take 1 tablet by mouth daily.  Dispense: 90 tablet; Refill: 3 - amLODipine (NORVASC) 10 MG  tablet; Take 1 tablet (10 mg total) by mouth daily.  Dispense: 90 tablet; Refill: 3  3. Hyperlipidemia - Controlled - simvastatin (ZOCOR) 20 MG tablet; Take 0.5 tablets (10 mg total) by mouth daily.  Dispense: 90 tablet; Refill: 3  4. Medication refill  - levothyroxine (SYNTHROID, LEVOTHROID) 75 MCG tablet; Take 1 tablet (75 mcg total) by mouth daily before breakfast.  Dispense: 90 tablet; Refill: 3 - valsartan-hydrochlorothiazide (DIOVAN-HCT) 160-25 MG tablet; Take 1 tablet by mouth daily.  Dispense: 90 tablet; Refill: 3 - simvastatin (ZOCOR) 20 MG tablet; Take 0.5 tablets (10 mg total) by mouth daily.  Dispense: 90 tablet; Refill: 3 - amLODipine (NORVASC) 10 MG tablet; Take 1 tablet (10 mg total) by mouth daily.  Dispense: 90 tablet; Refill: 3 - tiotropium (SPIRIVA HANDIHALER) 18 MCG inhalation capsule; Place 1 capsule (18 mcg total) into inhaler and inhale daily.  Dispense: 90 capsule; Refill: 3

## 2015-08-14 NOTE — Progress Notes (Signed)
Pre visit review using our clinic review tool, if applicable. No additional management support is needed unless otherwise documented below in the visit note. 

## 2015-08-14 NOTE — Patient Instructions (Addendum)
It was great seeing you again!  Use Tucks medicated pads or any other OTC medication for your hemrhoids. Also add Metamucil to your diet daily. If you continue to have issues or they become worse, please let me know.   Follow up as needed.   Health Maintenance, Female Adopting a healthy lifestyle and getting preventive care can go a long way to promote health and wellness. Talk with your health care provider about what schedule of regular examinations is right for you. This is a good chance for you to check in with your provider about disease prevention and staying healthy. In between checkups, there are plenty of things you can do on your own. Experts have done a lot of research about which lifestyle changes and preventive measures are most likely to keep you healthy. Ask your health care provider for more information. WEIGHT AND DIET  Eat a healthy diet  Be sure to include plenty of vegetables, fruits, low-fat dairy products, and lean protein.  Do not eat a lot of foods high in solid fats, added sugars, or salt.  Get regular exercise. This is one of the most important things you can do for your health.  Most adults should exercise for at least 150 minutes each week. The exercise should increase your heart rate and make you sweat (moderate-intensity exercise).  Most adults should also do strengthening exercises at least twice a week. This is in addition to the moderate-intensity exercise.  Maintain a healthy weight  Body mass index (BMI) is a measurement that can be used to identify possible weight problems. It estimates body fat based on height and weight. Your health care provider can help determine your BMI and help you achieve or maintain a healthy weight.  For females 2 years of age and older:   A BMI below 18.5 is considered underweight.  A BMI of 18.5 to 24.9 is normal.  A BMI of 25 to 29.9 is considered overweight.  A BMI of 30 and above is considered obese.  Watch  levels of cholesterol and blood lipids  You should start having your blood tested for lipids and cholesterol at 75 years of age, then have this test every 5 years.  You may need to have your cholesterol levels checked more often if:  Your lipid or cholesterol levels are high.  You are older than 75 years of age.  You are at high risk for heart disease.  CANCER SCREENING   Lung Cancer  Lung cancer screening is recommended for adults 24-52 years old who are at high risk for lung cancer because of a history of smoking.  A yearly low-dose CT scan of the lungs is recommended for people who:  Currently smoke.  Have quit within the past 15 years.  Have at least a 30-pack-year history of smoking. A pack year is smoking an average of one pack of cigarettes a day for 1 year.  Yearly screening should continue until it has been 15 years since you quit.  Yearly screening should stop if you develop a health problem that would prevent you from having lung cancer treatment.  Breast Cancer  Practice breast self-awareness. This means understanding how your breasts normally appear and feel.  It also means doing regular breast self-exams. Let your health care provider know about any changes, no matter how small.  If you are in your 20s or 30s, you should have a clinical breast exam (CBE) by a health care provider every 1-3 years as part  of a regular health exam.  If you are 77 or older, have a CBE every year. Also consider having a breast X-ray (mammogram) every year.  If you have a family history of breast cancer, talk to your health care provider about genetic screening.  If you are at high risk for breast cancer, talk to your health care provider about having an MRI and a mammogram every year.  Breast cancer gene (BRCA) assessment is recommended for women who have family members with BRCA-related cancers. BRCA-related cancers include:  Breast.  Ovarian.  Tubal.  Peritoneal  cancers.  Results of the assessment will determine the need for genetic counseling and BRCA1 and BRCA2 testing. Cervical Cancer Your health care provider may recommend that you be screened regularly for cancer of the pelvic organs (ovaries, uterus, and vagina). This screening involves a pelvic examination, including checking for microscopic changes to the surface of your cervix (Pap test). You may be encouraged to have this screening done every 3 years, beginning at age 87.  For women ages 85-65, health care providers may recommend pelvic exams and Pap testing every 3 years, or they may recommend the Pap and pelvic exam, combined with testing for human papilloma virus (HPV), every 5 years. Some types of HPV increase your risk of cervical cancer. Testing for HPV may also be done on women of any age with unclear Pap test results.  Other health care providers may not recommend any screening for nonpregnant women who are considered low risk for pelvic cancer and who do not have symptoms. Ask your health care provider if a screening pelvic exam is right for you.  If you have had past treatment for cervical cancer or a condition that could lead to cancer, you need Pap tests and screening for cancer for at least 20 years after your treatment. If Pap tests have been discontinued, your risk factors (such as having a new sexual partner) need to be reassessed to determine if screening should resume. Some women have medical problems that increase the chance of getting cervical cancer. In these cases, your health care provider may recommend more frequent screening and Pap tests. Colorectal Cancer  This type of cancer can be detected and often prevented.  Routine colorectal cancer screening usually begins at 75 years of age and continues through 75 years of age.  Your health care provider may recommend screening at an earlier age if you have risk factors for colon cancer.  Your health care provider may also  recommend using home test kits to check for hidden blood in the stool.  A small camera at the end of a tube can be used to examine your colon directly (sigmoidoscopy or colonoscopy). This is done to check for the earliest forms of colorectal cancer.  Routine screening usually begins at age 20.  Direct examination of the colon should be repeated every 5-10 years through 75 years of age. However, you may need to be screened more often if early forms of precancerous polyps or small growths are found. Skin Cancer  Check your skin from head to toe regularly.  Tell your health care provider about any new moles or changes in moles, especially if there is a change in a mole's shape or color.  Also tell your health care provider if you have a mole that is larger than the size of a pencil eraser.  Always use sunscreen. Apply sunscreen liberally and repeatedly throughout the day.  Protect yourself by wearing long sleeves, pants, a  wide-brimmed hat, and sunglasses whenever you are outside. HEART DISEASE, DIABETES, AND HIGH BLOOD PRESSURE   High blood pressure causes heart disease and increases the risk of stroke. High blood pressure is more likely to develop in:  People who have blood pressure in the high end of the normal range (130-139/85-89 Wen Hg).  People who are overweight or obese.  People who are African American.  If you are 40-69 years of age, have your blood pressure checked every 3-5 years. If you are 39 years of age or older, have your blood pressure checked every year. You should have your blood pressure measured twice--once when you are at a hospital or clinic, and once when you are not at a hospital or clinic. Record the average of the two measurements. To check your blood pressure when you are not at a hospital or clinic, you can use:  An automated blood pressure machine at a pharmacy.  A home blood pressure monitor.  If you are between 2 years and 36 years old, ask your health  care provider if you should take aspirin to prevent strokes.  Have regular diabetes screenings. This involves taking a blood sample to check your fasting blood sugar level.  If you are at a normal weight and have a low risk for diabetes, have this test once every three years after 75 years of age.  If you are overweight and have a high risk for diabetes, consider being tested at a younger age or more often. PREVENTING INFECTION  Hepatitis B  If you have a higher risk for hepatitis B, you should be screened for this virus. You are considered at high risk for hepatitis B if:  You were born in a country where hepatitis B is common. Ask your health care provider which countries are considered high risk.  Your parents were born in a high-risk country, and you have not been immunized against hepatitis B (hepatitis B vaccine).  You have HIV or AIDS.  You use needles to inject street drugs.  You live with someone who has hepatitis B.  You have had sex with someone who has hepatitis B.  You get hemodialysis treatment.  You take certain medicines for conditions, including cancer, organ transplantation, and autoimmune conditions. Hepatitis C  Blood testing is recommended for:  Everyone born from [birth year] through 1966.  Anyone with known risk factors for hepatitis C. Sexually transmitted infections (STIs)  You should be screened for sexually transmitted infections (STIs) including gonorrhea and chlamydia if:  You are sexually active and are younger than 75 years of age.  You are older than 75 years of age and your health care provider tells you that you are at risk for this type of infection.  Your sexual activity has changed since you were last screened and you are at an increased risk for chlamydia or gonorrhea. Ask your health care provider if you are at risk.  If you do not have HIV, but are at risk, it may be recommended that you take a prescription medicine daily to prevent HIV  infection. This is called pre-exposure prophylaxis (PrEP). You are considered at risk if:  You are sexually active and do not regularly use condoms or know the HIV status of your partner(s).  You take drugs by injection.  You are sexually active with a partner who has HIV. Talk with your health care provider about whether you are at high risk of being infected with HIV. If you choose to begin PrEP,  you should first be tested for HIV. You should then be tested every 3 months for as long as you are taking PrEP.  PREGNANCY   If you are premenopausal and you may become pregnant, ask your health care provider about preconception counseling.  If you may become pregnant, take 400 to 800 micrograms (mcg) of folic acid every day.  If you want to prevent pregnancy, talk to your health care provider about birth control (contraception). OSTEOPOROSIS AND MENOPAUSE   Osteoporosis is a disease in which the bones lose minerals and strength with aging. This can result in serious bone fractures. Your risk for osteoporosis can be identified using a bone density scan.  If you are 38 years of age or older, or if you are at risk for osteoporosis and fractures, ask your health care provider if you should be screened.  Ask your health care provider whether you should take a calcium or vitamin D supplement to lower your risk for osteoporosis.  Menopause may have certain physical symptoms and risks.  Hormone replacement therapy may reduce some of these symptoms and risks. Talk to your health care provider about whether hormone replacement therapy is right for you.  HOME CARE INSTRUCTIONS   Schedule regular health, dental, and eye exams.  Stay current with your immunizations.   Do not use any tobacco products including cigarettes, chewing tobacco, or electronic cigarettes.  If you are pregnant, do not drink alcohol.  If you are breastfeeding, limit how much and how often you drink alcohol.  Limit  alcohol intake to no more than 1 drink per day for nonpregnant women. One drink equals 12 ounces of beer, 5 ounces of wine, or 1 ounces of hard liquor.  Do not use street drugs.  Do not share needles.  Ask your health care provider for help if you need support or information about quitting drugs.  Tell your health care provider if you often feel depressed.  Tell your health care provider if you have ever been abused or do not feel safe at home.   This information is not intended to replace advice given to you by your health care provider. Make sure you discuss any questions you have with your health care provider.   Document Released: 04/06/2011 Document Revised: 10/12/2014 Document Reviewed: 08/23/2013 Elsevier Interactive Patient Education [employer].

## 2015-08-19 ENCOUNTER — Ambulatory Visit (HOSPITAL_BASED_OUTPATIENT_CLINIC_OR_DEPARTMENT_OTHER)
Admission: RE | Admit: 2015-08-19 | Discharge: 2015-08-19 | Disposition: A | Discharge status: Home / Self Care | Payer: Medicare HMO | Source: Ambulatory Visit | Attending: Internal Medicine | Admitting: Internal Medicine

## 2015-08-19 DIAGNOSIS — Z1231 Encounter for screening mammogram for malignant neoplasm of breast: Secondary | ICD-10-CM | POA: Insufficient documentation

## 2015-08-19 DIAGNOSIS — Z139 Encounter for screening, unspecified: Secondary | ICD-10-CM

## 2015-09-09 DIAGNOSIS — H353121 Nonexudative age-related macular degeneration, left eye, early dry stage: Secondary | ICD-10-CM | POA: Diagnosis not present

## 2015-09-09 DIAGNOSIS — H353111 Nonexudative age-related macular degeneration, right eye, early dry stage: Secondary | ICD-10-CM | POA: Diagnosis not present

## 2015-12-09 DIAGNOSIS — R69 Illness, unspecified: Secondary | ICD-10-CM | POA: Diagnosis not present

## 2016-02-27 ENCOUNTER — Telehealth: Payer: Self-pay

## 2016-02-27 NOTE — Telephone Encounter (Signed)
Patient is on the list for [facility] 2017 and may be a good candidate for an AWV in 2017. No appt scheduled for 2017.

## 2016-03-09 DIAGNOSIS — H353111 Nonexudative age-related macular degeneration, right eye, early dry stage: Secondary | ICD-10-CM | POA: Diagnosis not present

## 2016-03-09 DIAGNOSIS — H40013 Open angle with borderline findings, low risk, bilateral: Secondary | ICD-10-CM | POA: Diagnosis not present

## 2016-06-30 ENCOUNTER — Ambulatory Visit (INDEPENDENT_AMBULATORY_CARE_PROVIDER_SITE_OTHER): Payer: Medicare HMO

## 2016-06-30 DIAGNOSIS — Z23 Encounter for immunization: Secondary | ICD-10-CM | POA: Diagnosis not present

## 2016-07-16 ENCOUNTER — Other Ambulatory Visit: Payer: Self-pay | Admitting: Family Medicine

## 2016-07-16 DIAGNOSIS — Z1231 Encounter for screening mammogram for malignant neoplasm of breast: Secondary | ICD-10-CM

## 2016-08-17 ENCOUNTER — Ambulatory Visit (INDEPENDENT_AMBULATORY_CARE_PROVIDER_SITE_OTHER): Payer: Medicare HMO | Admitting: Family Medicine

## 2016-08-17 VITALS — BP 118/72 | HR 72 | Temp 97.7°F | Ht 65.0 in | Wt 122.8 lb

## 2016-08-17 DIAGNOSIS — Z760 Encounter for issue of repeat prescription: Secondary | ICD-10-CM | POA: Diagnosis not present

## 2016-08-17 DIAGNOSIS — E034 Atrophy of thyroid (acquired): Secondary | ICD-10-CM

## 2016-08-17 DIAGNOSIS — Z0000 Encounter for general adult medical examination without abnormal findings: Secondary | ICD-10-CM | POA: Diagnosis not present

## 2016-08-17 DIAGNOSIS — Z5181 Encounter for therapeutic drug level monitoring: Secondary | ICD-10-CM | POA: Diagnosis not present

## 2016-08-17 DIAGNOSIS — E782 Mixed hyperlipidemia: Secondary | ICD-10-CM

## 2016-08-17 DIAGNOSIS — I10 Essential (primary) hypertension: Secondary | ICD-10-CM | POA: Diagnosis not present

## 2016-08-17 DIAGNOSIS — J441 Chronic obstructive pulmonary disease with (acute) exacerbation: Secondary | ICD-10-CM

## 2016-08-17 MED ORDER — SIMVASTATIN 20 MG PO TABS: 10.0000 mg | ORAL_TABLET | Freq: Every day | ORAL | 3 refills | Status: DC

## 2016-08-17 MED ORDER — LEVOTHYROXINE SODIUM 75 MCG PO TABS: 75.0000 ug | ORAL_TABLET | Freq: Every day | ORAL | 3 refills | Status: DC

## 2016-08-17 MED ORDER — PREDNISONE 20 MG PO TABS: ORAL_TABLET | ORAL | 0 refills | Status: DC

## 2016-08-17 MED ORDER — VALSARTAN-HYDROCHLOROTHIAZIDE 160-25 MG PO TABS: 1.0000 | ORAL_TABLET | Freq: Every day | ORAL | 3 refills | Status: DC

## 2016-08-17 MED ORDER — AMLODIPINE BESYLATE 10 MG PO TABS: 10.0000 mg | ORAL_TABLET | Freq: Every day | ORAL | 3 refills | Status: DC

## 2016-08-17 MED ORDER — TIOTROPIUM BROMIDE MONOHYDRATE 18 MCG IN CAPS: 18.0000 ug | ORAL_CAPSULE | Freq: Every day | RESPIRATORY_TRACT | 3 refills | Status: DC

## 2016-08-17 NOTE — Progress Notes (Signed)
Christine Keller at [facility] [address] [place], [place] [address] [id number]  Date:  08/17/2016   Name:  Christine Keller   DOB:  01/24/1941   MRN:  [id number]  PCP:  Christine Peng, NP    Chief Complaint: Annual Exam (Pt here for CPE.)   History of Present Illness:  Christine Keller is a 75 y.o. very pleasant female patient who presents with the following:  History of HTN, tobacco use, COPD.  Last labs about one year ago Last pap 2013- negative Colonoscopy: 08/02/2013 immunization are all UTD  She has noted a cough over the last 2 days. She has had some sinus drainage. No fever however She does have COPD so she is very careful with any illness No other concerns besides her cough She is retired, recently moved here from [place] She enjoys taking care of her 19 month old grand-daughter  The cough is not productive She is on spiriva for her COPD  Last labs about one year ago- she is fasting today Will do labs for her today  Lab Results  Component Value Date   TSH 3.07 07/19/2015     Patient Active Problem List   Diagnosis Date Noted  . Routine general medical examination at a health care facility 08/14/2015  . Hypothyroidism 12/05/2014  . Hyperlipidemia 08/08/2014  . Osteopenia 12/27/2013  . GERD (gastroesophageal reflux disease) 12/27/2013  . Guaiac positive stools 12/27/2013  . Ovarian tumor 05/16/2012  . Borderline diabetes 04/16/2011  . Preventative health care 04/15/2011  . Hyperglycemia 04/15/2011  . Essential hypertension 12/22/2010  . Carpal tunnel syndrome 12/22/2010  . COPD (chronic obstructive pulmonary disease) ([place]) 11/15/2009  . POLYCYTHEMIA 10/30/2009  . TOBACCO ABUSE 10/30/2009  . DYSPNEA 10/30/2009    Past Medical History:  Diagnosis Date  . Bilateral carpal tunnel syndrome   . COPD (chronic obstructive pulmonary disease) ([place]) 1.31.11   PFT FEV1 1.19(56%), FVC 2.68(90%), FEV1% 44, TLC 5.80(115%),  DLCO 52%  . Hyperlipidemia   . Hypertension   . Osteoporosis    OSTEOPENIA  . Ovarian tumor 2014   Testosterone secreting ovarain tumor  . Thyroid nodule   . Tobacco abuse     Past Surgical History:  Procedure Laterality Date  . CARPAL TUNNEL RELEASE  05/26/2012   Procedure: CARPAL TUNNEL RELEASE;  Surgeon: Christine Keller., MD;  Location: [facility];  Service: Orthopedics;  Laterality: Left;  Injection of Right thumb Carpal-Metacarpal joint also  . CARPAL TUNNEL RELEASE  08/16/2012   Procedure: CARPAL TUNNEL RELEASE;  Surgeon: Christine Keller., MD;  Location: [facility];  Service: Orthopedics;  Laterality: Right;  Inject Left Thumb Carpal Metacarpal Joint  . OTHER SURGICAL HISTORY  12/14/12   bilateral oopherectomy [facility]  . STERIOD INJECTION  08/16/2012   Procedure: STEROID INJECTION;  Surgeon: Christine Keller., MD;  Location: [facility];  Service: Orthopedics;  Laterality: Left;  Inject Left Thumb Carpal Metacarpal Joint  . TONSILLECTOMY    . ULNAR NERVE TRANSPOSITION  05/26/2012   Procedure: ULNAR NERVE DECOMPRESSION/TRANSPOSITION;  Surgeon: Christine Keller., MD;  Location: [facility];  Service: Orthopedics;  Laterality: Left;    Social History  Substance Use Topics  . Smoking status: Current Every Day Smoker    Packs/day: 1.00    Types: Cigarettes  . Smokeless tobacco: Never Used     Comment: Less than 1 ppd.  Marland Kitchen  Alcohol use 4.2 oz/week    7 Glasses of wine per week    Family History  Problem Relation Age of Onset  . Heart disease Mother   . Hypertension Mother   . Asthma Father   . Emphysema Father   . Heart disease Brother     Allergies  Allergen Reactions  . Codeine     REACTION: hallucinations  . Niacin     REACTION: rash    Medication list has been reviewed and updated.  Current Outpatient Prescriptions on File Prior to Visit  Medication Sig Dispense Refill  . Albuterol Sulfate (PROAIR  RESPICLICK) [id number] (90 BASE) MCG/ACT AEPB Inhale 2 puffs into the lungs every 6 (six) hours as needed (wheezing). 1 each 2  . amLODipine (NORVASC) 10 MG tablet Take 1 tablet (10 mg total) by mouth daily. 90 tablet 3  . aspirin 81 MG tablet Take 81 mg by mouth daily.      Marland Kitchen CALCIUM-MAGNESIUM-ZINC PO Take by mouth daily.    . Cholecalciferol (VITAMIN D3) 2000 UNITS capsule Take 2,000 Units by mouth daily.    . Coenzyme Q10 (COQ10) 200 MG CAPS Take 1 capsule by mouth daily.      . Garlic Oil (ODORLESS GARLIC) XX123456 MG TABS Take 1 tablet by mouth daily.      Nyoka Cowden Tea 315 MG CAPS Take 1 capsule by mouth daily.      Marland Kitchen levothyroxine (SYNTHROID, LEVOTHROID) 75 MCG tablet Take 1 tablet (75 mcg total) by mouth daily before breakfast. 90 tablet 3  . Multiple Vitamin (MULTIVITAMIN) tablet Take 1 tablet by mouth daily.      . OMEGA 3 1000 MG CAPS Take 1 capsule by mouth 2 (two) times daily.      . Probiotic Product (PROBIOTIC DAILY PO) Take by mouth daily.    . ranitidine (ZANTAC) 150 MG tablet Take 150 mg by mouth 2 (two) times daily.    . simvastatin (ZOCOR) 20 MG tablet Take 0.5 tablets (10 mg total) by mouth daily. 90 tablet 3  . Thiamine HCl (VITAMIN B-1) 100 MG tablet Take 100 mg by mouth daily.      Marland Kitchen tiotropium (SPIRIVA HANDIHALER) 18 MCG inhalation capsule Place 1 capsule (18 mcg total) into inhaler and inhale daily. 90 capsule 3  . valsartan-hydrochlorothiazide (DIOVAN-HCT) 160-25 MG tablet Take 1 tablet by mouth daily. 90 tablet 3  . Vitamin Mixture (ESTER-C) 500-60 MG TABS Take 1 tablet by mouth daily.       No current facility-administered medications on file prior to visit.     Review of Systems:  As per HPI- otherwise negative. No nausea, vomiting, rash, headache, ST  Physical Examination: Vitals:   08/17/16 1429  BP: 118/72  Pulse: 72  Temp: 97.7 F (36.5 C)   Vitals:   08/17/16 1429  Weight: 122 lb 12.8 oz (55.7 kg)  Height: 5\' 5"  (1.651 m)   Body mass index is 20.43  kg/m. Ideal Body Weight: Weight in (lb) to have BMI = 25: 149.9  GEN: WDWN, NAD, Non-toxic, A & O x 3, looks well, smoker HEENT: Atraumatic, Normocephalic. Neck supple. No masses, No LAD.  Bilateral TM wnl, oropharynx normal.  PEERL,EOMI.   Ears and Nose: No external deformity. CV: RRR, No M/G/R. No JVD. No thrill. No extra heart sounds. PULM: CTA B, no wheezes, crackles, rhonchi. No retractions. No resp. distress. No accessory muscle use. ABD: S, NT, ND EXTR: No c/c/e NEURO Normal gait.  PSYCH: Normally interactive. Conversant.  Not depressed or anxious appearing.  Calm demeanor.   BP Readings from Last 3 Encounters:  08/17/16 118/72  08/14/15 140/70  04/24/15 130/80    Assessment and Plan: Physical exam  Medication refill - Plan: valsartan-hydrochlorothiazide (DIOVAN-HCT) 160-25 MG tablet, simvastatin (ZOCOR) 20 MG tablet, amLODipine (NORVASC) 10 MG tablet, levothyroxine (SYNTHROID, LEVOTHROID) 75 MCG tablet, tiotropium (SPIRIVA HANDIHALER) 18 MCG inhalation capsule  Essential hypertension - Plan: valsartan-hydrochlorothiazide (DIOVAN-HCT) 160-25 MG tablet, amLODipine (NORVASC) 10 MG tablet, Comprehensive metabolic panel  Mixed hyperlipidemia - Plan: simvastatin (ZOCOR) 20 MG tablet, Lipid panel  COPD exacerbation (HCC) - Plan: predniSONE (DELTASONE) 20 MG tablet  Hypothyroidism due to acquired atrophy of thyroid - Plan: TSH  Medication monitoring encounter - Plan: CBC  Here today seeking a CPE Labs pending as above BP is well controlled Await lipids- she is on zocor She has noted a mild COPD exacerbation with cough and mucus.  Will treat with a short burst of oral steroids- she is already on albuterol and spiriva.  At this time abx do not seem to be indicated but she will let me know if not improving soon Monitor TSH Will plan further follow- up pending labs.   Signed Lamar Blinks, MD

## 2016-08-17 NOTE — Patient Instructions (Signed)
It was very nice to see you today! We will have you take prednisone 40 mg once a day for 5 days for your cough.  If this is not getting better or if you have any fever or worsening please let me know We will get labs for you today also

## 2016-08-17 NOTE — Progress Notes (Signed)
Pre visit review using our clinic review tool, if applicable. No additional management support is needed unless otherwise documented below in the visit note. 

## 2016-08-18 LAB — CBC
HCT: 41.7 % (ref 36.0–46.0)
Hemoglobin: 14.1 g/dL (ref 12.0–15.0)
MCHC: 34 g/dL (ref 30.0–36.0)
MCV: 97.4 fl (ref 78.0–100.0)
Platelets: 236 10*3/uL (ref 150.0–400.0)
RBC: 4.28 Mil/uL (ref 3.87–5.11)
RDW: 12.7 % (ref 11.5–15.5)
WBC: 9.3 10*3/uL (ref 4.0–10.5)

## 2016-08-18 LAB — COMPREHENSIVE METABOLIC PANEL
ALT: 16 U/L (ref 0–35)
AST: 22 U/L (ref 0–37)
Albumin: 4.5 g/dL (ref 3.5–5.2)
Alkaline Phosphatase: 68 U/L (ref 39–117)
BUN: 13 mg/dL (ref 6–23)
CO2: 29 mEq/L (ref 19–32)
Calcium: 10.1 mg/dL (ref 8.4–10.5)
Chloride: 104 mEq/L (ref 96–112)
Creatinine, Ser: 0.78 mg/dL (ref 0.40–1.20)
GFR: 76.25 mL/min (ref >60.00)
Glucose, Bld: 89 mg/dL (ref 70–99)
Potassium: 4.1 mEq/L (ref 3.5–5.1)
Sodium: 141 mEq/L (ref 135–145)
Total Bilirubin: 0.5 mg/dL (ref 0.2–1.2)
Total Protein: 7.3 g/dL (ref 6.0–8.3)

## 2016-08-18 LAB — LIPID PANEL
Cholesterol: 158 mg/dL (ref 0–200)
HDL: 59.5 mg/dL (ref >39.00)
LDL Cholesterol: 81 mg/dL (ref 0–99)
NonHDL: 98.52
Total CHOL/HDL Ratio: 3
Triglycerides: 89 mg/dL (ref 0.0–149.0)
VLDL: 17.8 mg/dL (ref 0.0–40.0)

## 2016-08-18 LAB — TSH: TSH: 0.73 u[IU]/mL (ref 0.35–4.50)

## 2016-08-20 ENCOUNTER — Ambulatory Visit (HOSPITAL_BASED_OUTPATIENT_CLINIC_OR_DEPARTMENT_OTHER)
Admission: RE | Admit: 2016-08-20 | Discharge: 2016-08-20 | Disposition: A | Discharge status: Home / Self Care | Payer: Medicare HMO | Source: Ambulatory Visit | Attending: Family Medicine | Admitting: Family Medicine

## 2016-08-20 DIAGNOSIS — Z1231 Encounter for screening mammogram for malignant neoplasm of breast: Secondary | ICD-10-CM

## 2016-10-20 DIAGNOSIS — H353111 Nonexudative age-related macular degeneration, right eye, early dry stage: Secondary | ICD-10-CM | POA: Diagnosis not present

## 2016-10-20 DIAGNOSIS — H40013 Open angle with borderline findings, low risk, bilateral: Secondary | ICD-10-CM | POA: Diagnosis not present

## 2016-12-21 DIAGNOSIS — R69 Illness, unspecified: Secondary | ICD-10-CM | POA: Diagnosis not present

## 2017-02-15 ENCOUNTER — Telehealth: Payer: Self-pay

## 2017-02-15 NOTE — Telephone Encounter (Signed)
Patient is on the list for [facility] 2018 and may be a good candidate for an AWV. Please let me know if/when appt is scheduled.   

## 2017-04-26 DIAGNOSIS — H40013 Open angle with borderline findings, low risk, bilateral: Secondary | ICD-10-CM | POA: Diagnosis not present

## 2017-04-26 DIAGNOSIS — H353111 Nonexudative age-related macular degeneration, right eye, early dry stage: Secondary | ICD-10-CM | POA: Diagnosis not present

## 2017-06-01 ENCOUNTER — Encounter: Payer: Self-pay | Admitting: Family Medicine

## 2017-06-01 DIAGNOSIS — K649 Unspecified hemorrhoids: Secondary | ICD-10-CM

## 2017-06-23 DIAGNOSIS — R69 Illness, unspecified: Secondary | ICD-10-CM | POA: Diagnosis not present

## 2017-06-24 DIAGNOSIS — K649 Unspecified hemorrhoids: Secondary | ICD-10-CM | POA: Diagnosis not present

## 2017-06-25 ENCOUNTER — Encounter: Payer: Self-pay | Admitting: Family Medicine

## 2017-06-25 ENCOUNTER — Encounter: Payer: Self-pay | Admitting: Adult Health

## 2017-07-08 DIAGNOSIS — R69 Illness, unspecified: Secondary | ICD-10-CM | POA: Diagnosis not present

## 2017-08-02 ENCOUNTER — Other Ambulatory Visit: Payer: Self-pay | Admitting: Family Medicine

## 2017-08-02 ENCOUNTER — Telehealth: Payer: Self-pay | Admitting: Family Medicine

## 2017-08-02 DIAGNOSIS — Z1231 Encounter for screening mammogram for malignant neoplasm of breast: Secondary | ICD-10-CM

## 2017-08-02 NOTE — Telephone Encounter (Signed)
That would be fine- I don't know if I have 2 CPE spots that are back to back.  However could put one of them in the CPE, and then give the other 2 x 15 minutes slots after?  That would be fine with me

## 2017-08-02 NOTE — Telephone Encounter (Signed)
Patient scheduled with PCP for 09/02/2017 1:30pm

## 2017-08-02 NOTE — Telephone Encounter (Signed)
Relation to pt: spouse / Christine Keller  Call back [place]   Reason for call:  Patient last physical 08/17/2016 and would like to schedule physical back to back with husband on 09/02/17 at 1:30pm, please advise

## 2017-08-08 ENCOUNTER — Other Ambulatory Visit: Payer: Self-pay | Admitting: Family Medicine

## 2017-08-08 DIAGNOSIS — Z760 Encounter for issue of repeat prescription: Secondary | ICD-10-CM

## 2017-08-23 ENCOUNTER — Ambulatory Visit (HOSPITAL_BASED_OUTPATIENT_CLINIC_OR_DEPARTMENT_OTHER)
Admission: RE | Admit: 2017-08-23 | Discharge: 2017-08-23 | Disposition: A | Discharge status: Home / Self Care | Payer: Medicare HMO | Source: Ambulatory Visit | Attending: Family Medicine | Admitting: Family Medicine

## 2017-08-23 ENCOUNTER — Encounter (HOSPITAL_BASED_OUTPATIENT_CLINIC_OR_DEPARTMENT_OTHER): Payer: Self-pay

## 2017-08-23 DIAGNOSIS — Z1231 Encounter for screening mammogram for malignant neoplasm of breast: Secondary | ICD-10-CM | POA: Insufficient documentation

## 2017-09-02 ENCOUNTER — Ambulatory Visit (INDEPENDENT_AMBULATORY_CARE_PROVIDER_SITE_OTHER): Payer: Medicare HMO | Admitting: Family Medicine

## 2017-09-02 ENCOUNTER — Encounter: Payer: Self-pay | Admitting: Family Medicine

## 2017-09-02 ENCOUNTER — Telehealth: Payer: Self-pay

## 2017-09-02 VITALS — BP 138/84 | HR 83 | Temp 97.9°F | Ht 64.0 in | Wt 122.8 lb

## 2017-09-02 DIAGNOSIS — E782 Mixed hyperlipidemia: Secondary | ICD-10-CM | POA: Diagnosis not present

## 2017-09-02 DIAGNOSIS — Z5181 Encounter for therapeutic drug level monitoring: Secondary | ICD-10-CM | POA: Diagnosis not present

## 2017-09-02 DIAGNOSIS — E034 Atrophy of thyroid (acquired): Secondary | ICD-10-CM

## 2017-09-02 DIAGNOSIS — Z760 Encounter for issue of repeat prescription: Secondary | ICD-10-CM | POA: Diagnosis not present

## 2017-09-02 DIAGNOSIS — I10 Essential (primary) hypertension: Secondary | ICD-10-CM

## 2017-09-02 DIAGNOSIS — R7303 Prediabetes: Secondary | ICD-10-CM | POA: Diagnosis not present

## 2017-09-02 DIAGNOSIS — J449 Chronic obstructive pulmonary disease, unspecified: Secondary | ICD-10-CM | POA: Diagnosis not present

## 2017-09-02 DIAGNOSIS — Z0000 Encounter for general adult medical examination without abnormal findings: Secondary | ICD-10-CM | POA: Diagnosis not present

## 2017-09-02 DIAGNOSIS — R69 Illness, unspecified: Secondary | ICD-10-CM | POA: Diagnosis not present

## 2017-09-02 LAB — CBC
HCT: 40.4 % (ref 36.0–46.0)
Hemoglobin: 13.7 g/dL (ref 12.0–15.0)
MCHC: 33.9 g/dL (ref 30.0–36.0)
MCV: 98.8 fl (ref 78.0–100.0)
Platelets: 291 10*3/uL (ref 150.0–400.0)
RBC: 4.08 Mil/uL (ref 3.87–5.11)
RDW: 12.6 % (ref 11.5–15.5)
WBC: 8.8 10*3/uL (ref 4.0–10.5)

## 2017-09-02 LAB — COMPREHENSIVE METABOLIC PANEL
ALT: 16 U/L (ref 0–35)
AST: 21 U/L (ref 0–37)
Albumin: 4.4 g/dL (ref 3.5–5.2)
Alkaline Phosphatase: 61 U/L (ref 39–117)
BUN: 16 mg/dL (ref 6–23)
CO2: 31 mEq/L (ref 19–32)
Calcium: 10.3 mg/dL (ref 8.4–10.5)
Chloride: 105 mEq/L (ref 96–112)
Creatinine, Ser: 0.82 mg/dL (ref 0.40–1.20)
GFR: 71.78 mL/min (ref >60.00)
Glucose, Bld: 100 mg/dL — ABNORMAL HIGH (ref 70–99)
Potassium: 3.8 mEq/L (ref 3.5–5.1)
Sodium: 141 mEq/L (ref 135–145)
Total Bilirubin: 0.6 mg/dL (ref 0.2–1.2)
Total Protein: 7.4 g/dL (ref 6.0–8.3)

## 2017-09-02 LAB — LIPID PANEL
Cholesterol: 179 mg/dL (ref 0–200)
HDL: 66.1 mg/dL (ref >39.00)
LDL Cholesterol: 91 mg/dL (ref 0–99)
NonHDL: 112.81
Total CHOL/HDL Ratio: 3
Triglycerides: 111 mg/dL (ref 0.0–149.0)
VLDL: 22.2 mg/dL (ref 0.0–40.0)

## 2017-09-02 LAB — HEMOGLOBIN A1C: Hgb A1c MFr Bld: 5.9 % (ref 4.6–6.5)

## 2017-09-02 LAB — TSH: TSH: 0.52 u[IU]/mL (ref 0.35–4.50)

## 2017-09-02 MED ORDER — TIOTROPIUM BROMIDE MONOHYDRATE 18 MCG IN CAPS: 18.0000 ug | ORAL_CAPSULE | Freq: Every day | RESPIRATORY_TRACT | 3 refills | Status: DC

## 2017-09-02 MED ORDER — IPRATROPIUM-ALBUTEROL 0.5-2.5 (3) MG/3ML IN SOLN: 3.0000 mL | Freq: Three times a day (TID) | RESPIRATORY_TRACT | 1 refills | Status: DC | PRN

## 2017-09-02 MED ORDER — SIMVASTATIN 20 MG PO TABS: 10.0000 mg | ORAL_TABLET | Freq: Every day | ORAL | 3 refills | Status: DC

## 2017-09-02 MED ORDER — VALSARTAN-HYDROCHLOROTHIAZIDE 160-25 MG PO TABS: 1.0000 | ORAL_TABLET | Freq: Every day | ORAL | 3 refills | Status: DC

## 2017-09-02 MED ORDER — AMLODIPINE BESYLATE 10 MG PO TABS: 10.0000 mg | ORAL_TABLET | Freq: Every day | ORAL | 3 refills | Status: DC

## 2017-09-02 MED ORDER — ALBUTEROL SULFATE 108 (90 BASE) MCG/ACT IN AEPB: 2.0000 | INHALATION_SPRAY | Freq: Four times a day (QID) | RESPIRATORY_TRACT | 5 refills | Status: DC | PRN

## 2017-09-02 NOTE — Patient Instructions (Addendum)
It was a pleasure to see you today as always!  I will be in touch with your labs I refilled your spiriva and also your proair (albuterol rescue inhaler) We are also going to give you some "duoneb" which is combined albuterol and ipratropium - you can use a nebulizer treatment every 8 hours when needed for your breathing symptoms Your BP looks ok!     Health Maintenance for Postmenopausal Women Menopause is a normal process in which your reproductive ability comes to an end. This process happens gradually over a span of months to years, usually between the ages of 57 and 97. Menopause is complete when you have missed 12 consecutive menstrual periods. It is important to talk with your health care provider about some of the most common conditions that affect postmenopausal women, such as heart disease, cancer, and bone loss (osteoporosis). Adopting a healthy lifestyle and getting preventive care can help to promote your health and wellness. Those actions can also lower your chances of developing some of these common conditions. What should I know about menopause? During menopause, you may experience a number of symptoms, such as:  Moderate-to-severe hot flashes.  Night sweats.  Decrease in sex drive.  Mood swings.  Headaches.  Tiredness.  Irritability.  Memory problems.  Insomnia.  Choosing to treat or not to treat menopausal changes is an individual decision that you make with your health care provider. What should I know about hormone replacement therapy and supplements? Hormone therapy products are effective for treating symptoms that are associated with menopause, such as hot flashes and night sweats. Hormone replacement carries certain risks, especially as you become older. If you are thinking about using estrogen or estrogen with progestin treatments, discuss the benefits and risks with your health care provider. What should I know about heart disease and stroke? Heart disease,  heart attack, and stroke become more likely as you age. This may be due, in part, to the hormonal changes that your body experiences during menopause. These can affect how your body processes dietary fats, triglycerides, and cholesterol. Heart attack and stroke are both medical emergencies. There are many things that you can do to help prevent heart disease and stroke:  Have your blood pressure checked at least every 1-2 years. High blood pressure causes heart disease and increases the risk of stroke.  If you are 98-34 years old, ask your health care provider if you should take aspirin to prevent a heart attack or a stroke.  Do not use any tobacco products, including cigarettes, chewing tobacco, or electronic cigarettes. If you need help quitting, ask your health care provider.  It is important to eat a healthy diet and maintain a healthy weight. ? Be sure to include plenty of vegetables, fruits, low-fat dairy products, and lean protein. ? Avoid eating foods that are high in solid fats, added sugars, or salt (sodium).  Get regular exercise. This is one of the most important things that you can do for your health. ? Try to exercise for at least 150 minutes each week. The type of exercise that you do should increase your heart rate and make you sweat. This is known as moderate-intensity exercise. ? Try to do strengthening exercises at least twice each week. Do these in addition to the moderate-intensity exercise.  Know your numbers.Ask your health care provider to check your cholesterol and your blood glucose. Continue to have your blood tested as directed by your health care provider.  What should I know about  cancer screening? There are several types of cancer. Take the following steps to reduce your risk and to catch any cancer development as early as possible. Breast Cancer  Practice breast self-awareness. ? This means understanding how your breasts normally appear and feel. ? It also  means doing regular breast self-exams. Let your health care provider know about any changes, no matter how small.  If you are 74 or older, have a clinician do a breast exam (clinical breast exam or CBE) every year. Depending on your age, family history, and medical history, it may be recommended that you also have a yearly breast X-ray (mammogram).  If you have a family history of breast cancer, talk with your health care provider about genetic screening.  If you are at high risk for breast cancer, talk with your health care provider about having an MRI and a mammogram every year.  Breast cancer (BRCA) gene test is recommended for women who have family members with BRCA-related cancers. Results of the assessment will determine the need for genetic counseling and BRCA1 and for BRCA2 testing. BRCA-related cancers include these types: ? Breast. This occurs in males or females. ? Ovarian. ? Tubal. This may also be called fallopian tube cancer. ? Cancer of the abdominal or pelvic lining (peritoneal cancer). ? Prostate. ? Pancreatic.  Cervical, Uterine, and Ovarian Cancer Your health care provider may recommend that you be screened regularly for cancer of the pelvic organs. These include your ovaries, uterus, and vagina. This screening involves a pelvic exam, which includes checking for microscopic changes to the surface of your cervix (Pap test).  For women ages 21-65, health care providers may recommend a pelvic exam and a Pap test every three years. For women ages 60-65, they may recommend the Pap test and pelvic exam, combined with testing for human papilloma virus (HPV), every five years. Some types of HPV increase your risk of cervical cancer. Testing for HPV may also be done on women of any age who have unclear Pap test results.  Other health care providers may not recommend any screening for nonpregnant women who are considered low risk for pelvic cancer and have no symptoms. Ask your health  care provider if a screening pelvic exam is right for you.  If you have had past treatment for cervical cancer or a condition that could lead to cancer, you need Pap tests and screening for cancer for at least 20 years after your treatment. If Pap tests have been discontinued for you, your risk factors (such as having a new sexual partner) need to be reassessed to determine if you should start having screenings again. Some women have medical problems that increase the chance of getting cervical cancer. In these cases, your health care provider may recommend that you have screening and Pap tests more often.  If you have a family history of uterine cancer or ovarian cancer, talk with your health care provider about genetic screening.  If you have vaginal bleeding after reaching menopause, tell your health care provider.  There are currently no reliable tests available to screen for ovarian cancer.  Lung Cancer Lung cancer screening is recommended for adults 8-84 years old who are at high risk for lung cancer because of a history of smoking. A yearly low-dose CT scan of the lungs is recommended if you:  Currently smoke.  Have a history of at least 30 pack-years of smoking and you currently smoke or have quit within the past 15 years. A pack-year is  smoking an average of one pack of cigarettes per day for one year.  Yearly screening should:  Continue until it has been 15 years since you quit.  Stop if you develop a health problem that would prevent you from having lung cancer treatment.  Colorectal Cancer  This type of cancer can be detected and can often be prevented.  Routine colorectal cancer screening usually begins at age 61 and continues through age 63.  If you have risk factors for colon cancer, your health care provider may recommend that you be screened at an earlier age.  If you have a family history of colorectal cancer, talk with your health care provider about genetic  screening.  Your health care provider may also recommend using home test kits to check for hidden blood in your stool.  A small camera at the end of a tube can be used to examine your colon directly (sigmoidoscopy or colonoscopy). This is done to check for the earliest forms of colorectal cancer.  Direct examination of the colon should be repeated every 5-10 years until age 91. However, if early forms of precancerous polyps or small growths are found or if you have a family history or genetic risk for colorectal cancer, you may need to be screened more often.  Skin Cancer  Check your skin from head to toe regularly.  Monitor any moles. Be sure to tell your health care provider: ? About any new moles or changes in moles, especially if there is a change in a mole's shape or color. ? If you have a mole that is larger than the size of a pencil eraser.  If any of your family members has a history of skin cancer, especially at a young age, talk with your health care provider about genetic screening.  Always use sunscreen. Apply sunscreen liberally and repeatedly throughout the day.  Whenever you are outside, protect yourself by wearing long sleeves, pants, a wide-brimmed hat, and sunglasses.  What should I know about osteoporosis? Osteoporosis is a condition in which bone destruction happens more quickly than new bone creation. After menopause, you may be at an increased risk for osteoporosis. To help prevent osteoporosis or the bone fractures that can happen because of osteoporosis, the following is recommended:  If you are 64-70 years old, get at least 1,000 mg of calcium and at least 600 mg of vitamin D per day.  If you are older than age 29 but younger than age 36, get at least 1,200 mg of calcium and at least 600 mg of vitamin D per day.  If you are older than age 72, get at least 1,200 mg of calcium and at least 800 mg of vitamin D per day.  Smoking and excessive alcohol intake  increase the risk of osteoporosis. Eat foods that are rich in calcium and vitamin D, and do weight-bearing exercises several times each week as directed by your health care provider. What should I know about how menopause affects my mental health? Depression may occur at any age, but it is more common as you become older. Common symptoms of depression include:  Low or sad mood.  Changes in sleep patterns.  Changes in appetite or eating patterns.  Feeling an overall lack of motivation or enjoyment of activities that you previously enjoyed.  Frequent crying spells.  Talk with your health care provider if you think that you are experiencing depression. What should I know about immunizations? It is important that you get and maintain  your immunizations. These include:  Tetanus, diphtheria, and pertussis (Tdap) booster vaccine.  Influenza every year before the flu season begins.  Pneumonia vaccine.  Shingles vaccine.  Your health care provider may also recommend other immunizations. This information is not intended to replace advice given to you by your health care provider. Make sure you discuss any questions you have with your health care provider. Document Released: 11/13/2005 Document Revised: 04/10/2016 Document Reviewed: 06/25/2015 Elsevier Interactive Patient Education  2018 [employer].

## 2017-09-02 NOTE — Progress Notes (Addendum)
[facility] [address], [place], [place] [address] [phone]  Date:  09/02/2017   Name:  Christine Keller   DOB:  1940/10/25   MRN:  [id number]  PCP:  [name], MD    Chief Complaint: Annual Exam (Pt here for CPE.Refills needed on Valsartan, amlodipine, simvastatin and written rx for Spiriva. )   History of Present Illness:  Christine Keller is a 76 y.o. very pleasant female patient who presents with the following:  Here today for a CPE- history of HTN, pre-diabetes, hypothyroidism, hyperlipidemia, osteopenia  I last saw her about a year ago: History of HTN, tobacco use, COPD.  Last labs about one year ago Last pap 2013- negative Colonoscopy: 08/02/2013 immunization are all UTD  She has noted a cough over the last 2 days. She has had some sinus drainage. No fever however She does have COPD so she is very careful with any illness No other concerns besides her cough She is retired, recently moved here from [place] She enjoys taking care of her 61 month old grand-daughter  The cough is not productive She is on spiriva for her COPD  Labs: a year ago Flu: done Mammo:done this month Colon: 2014 Shingrix: -not done yet, she did have zostavax about 10 years ago Pneumonia- completed both prevnar and pneumovax Tetanus is UTD  She is using albuterol as needed, she is also on routine spiriva for her COPD However she sometimes feels like her SOB is worse than in the past  She can walk far is she walks slowly, however if she tries to walk briskly she will get SOB after about 50 yards She can do housework but has to take her time and rest   Lab Results  Component Value Date   TSH 0.73 08/17/2016   Lab Results  Component Value Date   HGBA1C 6.0 (H) 04/15/2011     Patient Active Problem List   Diagnosis Date Noted  . Routine general medical examination at a health care facility 08/14/2015  . Hypothyroidism  12/05/2014  . Hyperlipidemia 08/08/2014  . Osteopenia 12/27/2013  . GERD (gastroesophageal reflux disease) 12/27/2013  . Guaiac positive stools 12/27/2013  . Ovarian tumor 05/16/2012  . Borderline diabetes 04/16/2011  . Preventative health care 04/15/2011  . Hyperglycemia 04/15/2011  . Essential hypertension 12/22/2010  . Carpal tunnel syndrome 12/22/2010  . COPD (chronic obstructive pulmonary disease) ([place]) 11/15/2009  . POLYCYTHEMIA 10/30/2009  . TOBACCO ABUSE 10/30/2009  . DYSPNEA 10/30/2009    Past Medical History:  Diagnosis Date  . Bilateral carpal tunnel syndrome   . COPD (chronic obstructive pulmonary disease) ([place]) 1.31.11   PFT FEV1 1.19(56%), FVC 2.68(90%), FEV1% 44, TLC 5.80(115%), DLCO 52%  . Hyperlipidemia   . Hypertension   . Osteoporosis    OSTEOPENIA  . Ovarian tumor 2014   Testosterone secreting ovarain tumor  . Thyroid nodule   . Tobacco abuse     Past Surgical History:  Procedure Laterality Date  . CARPAL TUNNEL RELEASE  05/26/2012   Procedure: CARPAL TUNNEL RELEASE;  Surgeon: Cammie Sickle., MD;  Location: [facility];  Service: Orthopedics;  Laterality: Left;  Injection of Right thumb Carpal-Metacarpal joint also  . CARPAL TUNNEL RELEASE  08/16/2012   Procedure: CARPAL TUNNEL RELEASE;  Surgeon: Cammie Sickle., MD;  Location: [facility];  Service: Orthopedics;  Laterality: Right;  Inject Left Thumb Carpal Metacarpal Joint  .  OTHER SURGICAL HISTORY  12/14/12   bilateral oopherectomy [facility]  . STERIOD INJECTION  08/16/2012   Procedure: STEROID INJECTION;  Surgeon: Cammie Sickle., MD;  Location: [facility];  Service: Orthopedics;  Laterality: Left;  Inject Left Thumb Carpal Metacarpal Joint  . TONSILLECTOMY    . ULNAR NERVE TRANSPOSITION  05/26/2012   Procedure: ULNAR NERVE DECOMPRESSION/TRANSPOSITION;  Surgeon: Cammie Sickle., MD;  Location: [facility];  Service: Orthopedics;   Laterality: Left;    Social History   Tobacco Use  . Smoking status: Current Every Day Smoker    Packs/day: 1.00    Types: Cigarettes  . Smokeless tobacco: Never Used  . Tobacco comment: Less than 1 ppd.  Substance Use Topics  . Alcohol use: Yes    Alcohol/week: 4.2 oz    Types: 7 Glasses of wine per week  . Drug use: No    Family History  Problem Relation Age of Onset  . Heart disease Mother   . Hypertension Mother   . Asthma Father   . Emphysema Father   . Heart disease Brother     Allergies  Allergen Reactions  . Codeine     REACTION: hallucinations  . Niacin     REACTION: rash    Medication list has been reviewed and updated.  Current Outpatient Medications on File Prior to Visit  Medication Sig Dispense Refill  . Albuterol Sulfate (PROAIR RESPICLICK) 147 (90 BASE) MCG/ACT AEPB Inhale 2 puffs into the lungs every 6 (six) hours as needed (wheezing). 1 each 2  . amLODipine (NORVASC) 10 MG tablet Take 1 tablet (10 mg total) by mouth daily. 90 tablet 3  . aspirin 81 MG tablet Take 81 mg by mouth daily.      Marland Kitchen CALCIUM-MAGNESIUM-ZINC PO Take by mouth daily.    . Cholecalciferol (VITAMIN D3) 2000 UNITS capsule Take 2,000 Units by mouth daily.    . Coenzyme Q10 (COQ10) 200 MG CAPS Take 1 capsule by mouth daily.      . Garlic Oil (ODORLESS GARLIC) 829 MG TABS Take 1 tablet by mouth daily.      Nyoka Cowden Tea 315 MG CAPS Take 1 capsule by mouth daily.      Marland Kitchen levothyroxine (SYNTHROID, LEVOTHROID) 75 MCG tablet TAKE ONE TABLET BY MOUTH DAILY BEFORE BREAKFAST 90 tablet 2  . Multiple Vitamin (MULTIVITAMIN) tablet Take 1 tablet by mouth daily.      . OMEGA 3 1000 MG CAPS Take 1 capsule by mouth 2 (two) times daily.      . Probiotic Product (PROBIOTIC DAILY PO) Take by mouth daily.    . ranitidine (ZANTAC) 150 MG tablet Take 150 mg by mouth 2 (two) times daily.    . simvastatin (ZOCOR) 20 MG tablet Take 0.5 tablets (10 mg total) by mouth daily. 90 tablet 3  . Thiamine HCl  (VITAMIN B-1) 100 MG tablet Take 100 mg by mouth daily.      Marland Kitchen tiotropium (SPIRIVA HANDIHALER) 18 MCG inhalation capsule Place 1 capsule (18 mcg total) into inhaler and inhale daily. 90 capsule 3  . valsartan-hydrochlorothiazide (DIOVAN-HCT) 160-25 MG tablet Take 1 tablet by mouth daily. 90 tablet 3  . Vitamin Mixture (ESTER-C) 500-60 MG TABS Take 1 tablet by mouth daily.       No current facility-administered medications on file prior to visit.     Review of Systems:  As per HPI- otherwise negative.  No vaginal bleeding No CP No fever or  chills   Physical Examination: Vitals:   09/02/17 1308  BP: 138/84  Pulse: 83  Temp: 97.9 F (36.6 C)  SpO2: 93%   Vitals:   09/02/17 1308  Weight: 122 lb 12.8 oz (55.7 kg)  Height: 5\' 4"  (1.626 m)   Body mass index is 21.08 kg/m. Ideal Body Weight: Weight in (lb) to have BMI = 25: 145.3  GEN: WDWN, NAD, Non-toxic, A & O x 3 HEENT: Atraumatic, Normocephalic. Neck supple. No masses, No LAD. Ears and Nose: No external deformity. CV: RRR, No M/G/R. No JVD. No thrill. No extra heart sounds. PULM: CTA B, no wheezes, crackles, rhonchi. No retractions. No resp. distress. No accessory muscle use. ABD: S, NT, ND, +BS. No rebound. No HSM. EXTR: No c/c/e NEURO Normal gait.  PSYCH: Normally interactive. Conversant. Not depressed or anxious appearing.  Calm demeanor.    Assessment and Plan: Physical exam  Essential hypertension - Plan: valsartan-hydrochlorothiazide (DIOVAN-HCT) 160-25 MG tablet, amLODipine (NORVASC) 10 MG tablet  Mixed hyperlipidemia - Plan: Lipid panel, simvastatin (ZOCOR) 20 MG tablet  Medication monitoring encounter - Plan: CBC, Comprehensive metabolic panel  Chronic obstructive pulmonary disease, unspecified COPD type (Reform) - Plan: Albuterol Sulfate (PROAIR RESPICLICK) 338 (90 Base) MCG/ACT AEPB, ipratropium-albuterol (DUONEB) 0.5-2.5 (3) MG/3ML SOLN  Pre-diabetes - Plan: Hemoglobin A1c  Hypothyroidism due to  acquired atrophy of thyroid - Plan: TSH  Medication refill - Plan: valsartan-hydrochlorothiazide (DIOVAN-HCT) 160-25 MG tablet, amLODipine (NORVASC) 10 MG tablet, simvastatin (ZOCOR) 20 MG tablet, tiotropium (SPIRIVA HANDIHALER) 18 MCG inhalation capsule  BP under good control on current medication regimen Follow-up on her TSH and A1c today Continue zocor for lipids Continue spiriva, and refilled her rescue albuerol. Also rx for duoneb today to use when she needs it- she will order a neb machine online to use as needed She may want to get shingrix in a few months at her convenience   Signed Lamar Blinks, MD  Received her labs 11/30 Results for orders placed or performed in visit on 09/02/17  CBC  Result Value Ref Range   WBC 8.8 4.0 - 10.5 K/uL   RBC 4.08 3.87 - 5.11 Mil/uL   Platelets 291.0 150.0 - 400.0 K/uL   Hemoglobin 13.7 12.0 - 15.0 g/dL   HCT 40.4 36.0 - 46.0 %   MCV 98.8 78.0 - 100.0 fl   MCHC 33.9 30.0 - 36.0 g/dL   RDW 12.6 11.5 - 15.5 %  Comprehensive metabolic panel  Result Value Ref Range   Sodium 141 135 - 145 mEq/L   Potassium 3.8 3.5 - 5.1 mEq/L   Chloride 105 96 - 112 mEq/L   CO2 31 19 - 32 mEq/L   Glucose, Bld 100 (H) 70 - 99 mg/dL   BUN 16 6 - 23 mg/dL   Creatinine, Ser 0.82 0.40 - 1.20 mg/dL   Total Bilirubin 0.6 0.2 - 1.2 mg/dL   Alkaline Phosphatase 61 39 - 117 U/L   AST 21 0 - 37 U/L   ALT 16 0 - 35 U/L   Total Protein 7.4 6.0 - 8.3 g/dL   Albumin 4.4 3.5 - 5.2 g/dL   Calcium 10.3 8.4 - 10.5 mg/dL   GFR 71.78 >60.00 mL/min  Hemoglobin A1c  Result Value Ref Range   Hgb A1c MFr Bld 5.9 4.6 - 6.5 %  Lipid panel  Result Value Ref Range   Cholesterol 179 0 - 200 mg/dL   Triglycerides 111.0 0.0 - 149.0 mg/dL   HDL 66.10 >39.00 mg/dL  VLDL 22.2 0.0 - 40.0 mg/dL   LDL Cholesterol 91 0 - 99 mg/dL   Total CHOL/HDL Ratio 3    NonHDL 112.81   TSH  Result Value Ref Range   TSH 0.52 0.35 - 4.50 uIU/mL   Message to pt  Your labs look  great Blood count is normal Metabolic profile normal Y5X is still in the pre-diabetes range Cholesterol excellent Thyroid normal  Please see me in about 6 months and take care!

## 2017-09-02 NOTE — Telephone Encounter (Signed)
Copied from [place] [id number]. Topic: General - Other >> Sep 02, 2017  3:12 PM Damita Dunnings, CMA wrote: Reason for CRM: PA for Proair Respiclick initiated via Covermymeds; KEY: [id number]. Awaiting determination.

## 2017-09-03 ENCOUNTER — Encounter: Payer: Self-pay | Admitting: Family Medicine

## 2017-09-03 NOTE — Telephone Encounter (Signed)
PA approved effective 10/03/2016 through 10/04/2018. Referral number: [id number].

## 2017-11-03 DIAGNOSIS — H40013 Open angle with borderline findings, low risk, bilateral: Secondary | ICD-10-CM | POA: Diagnosis not present

## 2017-11-03 DIAGNOSIS — H353111 Nonexudative age-related macular degeneration, right eye, early dry stage: Secondary | ICD-10-CM | POA: Diagnosis not present

## 2017-11-07 DIAGNOSIS — J019 Acute sinusitis, unspecified: Secondary | ICD-10-CM | POA: Diagnosis not present

## 2017-12-22 DIAGNOSIS — R69 Illness, unspecified: Secondary | ICD-10-CM | POA: Diagnosis not present

## 2018-01-27 DIAGNOSIS — H353111 Nonexudative age-related macular degeneration, right eye, early dry stage: Secondary | ICD-10-CM | POA: Diagnosis not present

## 2018-01-27 DIAGNOSIS — H40013 Open angle with borderline findings, low risk, bilateral: Secondary | ICD-10-CM | POA: Diagnosis not present

## 2018-04-02 ENCOUNTER — Other Ambulatory Visit: Payer: Self-pay | Admitting: Family Medicine

## 2018-04-02 DIAGNOSIS — Z760 Encounter for issue of repeat prescription: Secondary | ICD-10-CM

## 2018-04-14 ENCOUNTER — Other Ambulatory Visit: Payer: Self-pay | Admitting: Family Medicine

## 2018-04-14 DIAGNOSIS — Z760 Encounter for issue of repeat prescription: Secondary | ICD-10-CM

## 2018-05-11 ENCOUNTER — Other Ambulatory Visit: Payer: Self-pay | Admitting: Family Medicine

## 2018-05-11 DIAGNOSIS — Z760 Encounter for issue of repeat prescription: Secondary | ICD-10-CM

## 2018-05-16 ENCOUNTER — Encounter: Payer: Self-pay | Admitting: Family Medicine

## 2018-05-16 ENCOUNTER — Other Ambulatory Visit: Payer: Self-pay

## 2018-05-16 DIAGNOSIS — Z760 Encounter for issue of repeat prescription: Secondary | ICD-10-CM

## 2018-05-16 MED ORDER — LEVOTHYROXINE SODIUM 75 MCG PO TABS: ORAL_TABLET | ORAL | 0 refills | Status: DC

## 2018-06-07 NOTE — Progress Notes (Addendum)
[place] at [facility] [address], [place], [place] [address] [phone]  Date:  06/09/2018   Name:  Christine Keller   DOB:  [birth year]   MRN:  [id number]  PCP:  [name], MD    Chief Complaint: Hypertension (6 month follow up); Hypothyroidism; and COPD   History of Present Illness:  Christine Keller is a 77 y.o. very pleasant female patient who presents with the following:  Periodic follow-up visit today- history of GERD, hypothyroidism, HTN, COPD, pre-diabetes  Last seen here in November Retired, from [place] Enjoys taking care of her grand-daughter who is local to this area - she is 3.78 yo now  Offered a lung cancer screening CT but she is not really interested at this time   Lab Results  Component Value Date   HGBA1C 5.9 09/02/2017   Lab Results  Component Value Date   TSH 0.52 09/02/2017   Her husband had a very major operation in July of this year- his bladder and prostate were removed She is doing ok and handling this stress adequately  She is having COPD symptoms with exertion but feel that these are stable  She has not needed her albuterol or neb very often She does not feel like her breathing is impeding her from her activities Her pulmonary function seems to be stable She does have to rest between activities but is ok with doing this Continues to smoker  Flu shot due- would like to have today Full labs done in November - will get labs for her today and then plan for a CPE in 6 months   Synthroid diovan hct Amlodipine 10 zocor 20 spiriva albuerol prn duoneb prn   Patient Active Problem List   Diagnosis Date Noted  . Routine general medical examination at a health care facility 08/14/2015  . Hypothyroidism 12/05/2014  . Hyperlipidemia 08/08/2014  . Osteopenia 12/27/2013  . GERD (gastroesophageal reflux disease) 12/27/2013  . Guaiac positive stools 12/27/2013  . Ovarian tumor 05/16/2012  .  Borderline diabetes 04/16/2011  . Preventative health care 04/15/2011  . Hyperglycemia 04/15/2011  . Essential hypertension 12/22/2010  . Carpal tunnel syndrome 12/22/2010  . COPD (chronic obstructive pulmonary disease) ([place]) 11/15/2009  . POLYCYTHEMIA 10/30/2009  . TOBACCO ABUSE 10/30/2009  . DYSPNEA 10/30/2009    Past Medical History:  Diagnosis Date  . Bilateral carpal tunnel syndrome   . COPD (chronic obstructive pulmonary disease) ([place]) 1.31.11   PFT FEV1 1.19(56%), FVC 2.68(90%), FEV1% 44, TLC 5.80(115%), DLCO 52%  . Hyperlipidemia   . Hypertension   . Osteoporosis    OSTEOPENIA  . Ovarian tumor 2014   Testosterone secreting ovarain tumor  . Thyroid nodule   . Tobacco abuse     Past Surgical History:  Procedure Laterality Date  . CARPAL TUNNEL RELEASE  05/26/2012   Procedure: CARPAL TUNNEL RELEASE;  Surgeon: Cammie Sickle., MD;  Location: [place];  Service: Orthopedics;  Laterality: Left;  Injection of Right thumb Carpal-Metacarpal joint also  . CARPAL TUNNEL RELEASE  08/16/2012   Procedure: CARPAL TUNNEL RELEASE;  Surgeon: Cammie Sickle., MD;  Location: [place];  Service: Orthopedics;  Laterality: Right;  Inject Left Thumb Carpal Metacarpal Joint  . OTHER SURGICAL HISTORY  12/14/12   bilateral oopherectomy [facility]  . STERIOD INJECTION  08/16/2012   Procedure: STEROID INJECTION;  Surgeon: Cammie Sickle., MD;  Location: [place];  Service: Orthopedics;  Laterality: Left;  Inject Left Thumb Carpal Metacarpal Joint  . TONSILLECTOMY    . ULNAR NERVE TRANSPOSITION  05/26/2012   Procedure: ULNAR NERVE DECOMPRESSION/TRANSPOSITION;  Surgeon: Cammie Sickle., MD;  Location: [place];  Service: Orthopedics;  Laterality: Left;    Social History   Tobacco Use  . Smoking status: Current Every Day Smoker    Packs/day: 1.00    Types: Cigarettes  . Smokeless tobacco: Never Used  . Tobacco comment:  Less than 1 ppd.  Substance Use Topics  . Alcohol use: Yes    Alcohol/week: 7.0 standard drinks    Types: 7 Glasses of wine per week  . Drug use: No    Family History  Problem Relation Age of Onset  . Heart disease Mother   . Hypertension Mother   . Asthma Father   . Emphysema Father   . Heart disease Brother     Allergies  Allergen Reactions  . Codeine     REACTION: hallucinations  . Niacin     REACTION: rash    Medication list has been reviewed and updated.  Current Outpatient Medications on File Prior to Visit  Medication Sig Dispense Refill  . Albuterol Sulfate (PROAIR RESPICLICK) 008 (90 Base) MCG/ACT AEPB Inhale 2 puffs into the lungs every 6 (six) hours as needed (wheezing). 1 each 5  . amLODipine (NORVASC) 10 MG tablet Take 1 tablet (10 mg total) by mouth daily. 90 tablet 3  . CALCIUM-MAGNESIUM-ZINC PO Take by mouth daily.    . Cholecalciferol (VITAMIN D3) 2000 UNITS capsule Take 2,000 Units by mouth daily.    . Coenzyme Q10 (COQ10) 200 MG CAPS Take 1 capsule by mouth daily.      . Garlic Oil (ODORLESS GARLIC) 676 MG TABS Take 1 tablet by mouth daily.      Nyoka Cowden Tea 315 MG CAPS Take 1 capsule by mouth daily.      Marland Kitchen ipratropium-albuterol (DUONEB) 0.5-2.5 (3) MG/3ML SOLN Take 3 mLs by nebulization 3 (three) times daily as needed. Use when needed for shortness of breath exacerbation 360 mL 1  . levothyroxine (SYNTHROID, LEVOTHROID) 75 MCG tablet TAKE ONE TABLET BY MOUTH DAILY BEFORE BREAKFAST 90 tablet 0  . Multiple Vitamin (MULTIVITAMIN) tablet Take 1 tablet by mouth daily.      . OMEGA 3 1000 MG CAPS Take 1 capsule by mouth 2 (two) times daily.      . Probiotic Product (PROBIOTIC DAILY PO) Take by mouth daily.    . ranitidine (ZANTAC) 150 MG tablet Take 150 mg by mouth 2 (two) times daily.    . simvastatin (ZOCOR) 20 MG tablet Take 0.5 tablets (10 mg total) by mouth daily. 90 tablet 3  . Thiamine HCl (VITAMIN B-1) 100 MG tablet Take 100 mg by mouth daily.      Marland Kitchen  tiotropium (SPIRIVA HANDIHALER) 18 MCG inhalation capsule Place 1 capsule (18 mcg total) into inhaler and inhale daily. 90 capsule 3  . valsartan-hydrochlorothiazide (DIOVAN-HCT) 160-25 MG tablet Take 1 tablet by mouth daily. 90 tablet 3  . Vitamin Mixture (ESTER-C) 500-60 MG TABS Take 1 tablet by mouth daily.       No current facility-administered medications on file prior to visit.     Review of Systems:  As per HPI- otherwise negative. No fever or chills Stable SOB with exertion due to COPD No vomiting or diarrhea   Physical Examination: Vitals:   06/09/18 0942  BP: 140/78  Pulse:  84  Resp: 16  Temp: 98.1 F (36.7 C)  SpO2: 95%   Vitals:   06/09/18 0942  Weight: 118 lb 9.6 oz (53.8 kg)  Height: 5\' 4"  (1.626 m)   Body mass index is 20.36 kg/m. Ideal Body Weight: Weight in (lb) to have BMI = 25: 145.3  GEN: WDWN, NAD, Non-toxic, A & O x 3, normal weight HEENT: Atraumatic, Normocephalic. Neck supple. No masses, No LAD.  Bilateral TM wnl, oropharynx normal.  PEERL,EOMI.   Ears and Nose: No external deformity. CV: RRR, No M/G/R. No JVD. No thrill. No extra heart sounds. PULM: CTA B, no wheezes, crackles, rhonchi. No retractions. No resp. distress. No accessory muscle use. EXTR: No c/c/e NEURO Normal gait.  PSYCH: Normally interactive. Conversant. Not depressed or anxious appearing.  Calm demeanor.  Looks well, smoker   Assessment and Plan: Need for influenza vaccination - Plan: Flu vaccine HIGH DOSE PF (Fluzone High dose)  Mixed hyperlipidemia - Plan: Lipid panel  Hypothyroidism due to acquired atrophy of thyroid - Plan: TSH  Chronic obstructive pulmonary disease, unspecified COPD type ([place])  Essential hypertension - Plan: Comprehensive metabolic panel, CBC  Pre-diabetes - Plan: Hemoglobin A1c  Labs pending as above Flu shot given  Will plan further follow- up pending labs. Need to refill her synthroid once TSH comes in  Offered to have her see pulmonology  or to do a CT screening but she declines at this time Plan for a CPE in 6 months   Signed Lamar Blinks, MD  Received her labs 9/7  Results for orders placed or performed in visit on 06/09/18  Comprehensive metabolic panel  Result Value Ref Range   Sodium 141 135 - 145 mEq/L   Potassium 4.0 3.5 - 5.1 mEq/L   Chloride 102 96 - 112 mEq/L   CO2 31 19 - 32 mEq/L   Glucose, Bld 107 (H) 70 - 99 mg/dL   BUN 16 6 - 23 mg/dL   Creatinine, Ser 0.77 0.40 - 1.20 mg/dL   Total Bilirubin 0.5 0.2 - 1.2 mg/dL   Alkaline Phosphatase 70 39 - 117 U/L   AST 21 0 - 37 U/L   ALT 17 0 - 35 U/L   Total Protein 7.1 6.0 - 8.3 g/dL   Albumin 4.5 3.5 - 5.2 g/dL   Calcium 10.2 8.4 - 10.5 mg/dL   GFR 77.03 >60.00 mL/min  Hemoglobin A1c  Result Value Ref Range   Hgb A1c MFr Bld 6.0 4.6 - 6.5 %  Lipid panel  Result Value Ref Range   Cholesterol 169 0 - 200 mg/dL   Triglycerides 89.0 0.0 - 149.0 mg/dL   HDL 65.20 >39.00 mg/dL   VLDL 17.8 0.0 - 40.0 mg/dL   LDL Cholesterol 86 0 - 99 mg/dL   Total CHOL/HDL Ratio 3    NonHDL 103.33   TSH  Result Value Ref Range   TSH 0.27 (L) 0.35 - 4.50 uIU/mL  CBC  Result Value Ref Range   WBC 8.1 4.0 - 10.5 K/uL   RBC 4.41 3.87 - 5.11 Mil/uL   Platelets 299.0 150.0 - 400.0 K/uL   Hemoglobin 14.8 12.0 - 15.0 g/dL   HCT 42.7 36.0 - 46.0 %   MCV 96.7 78.0 - 100.0 fl   MCHC 34.8 30.0 - 36.0 g/dL   RDW 12.6 11.5 - 15.5 %   Message to pt   Metabolic profile looks fine Your a1c is in the PRE-diabetes range, stable Cholesterol looks good Blood counts are  normal Your TSH is too low- this indicates that we have you on too much thyroid replacement.  I am going to send in an rx for 50 mg (you are currently on 75 mcg).  Please change over to the 50 mcg dose, and then come in for a lab visit only to check TSH in about one month.   Otherwise we can plan to visit in 6 months in the office.  Please let me know if any questions!

## 2018-06-09 ENCOUNTER — Ambulatory Visit (INDEPENDENT_AMBULATORY_CARE_PROVIDER_SITE_OTHER): Payer: Medicare HMO | Admitting: Family Medicine

## 2018-06-09 ENCOUNTER — Encounter: Payer: Self-pay | Admitting: Family Medicine

## 2018-06-09 VITALS — BP 140/78 | HR 84 | Temp 98.1°F | Resp 16 | Ht 64.0 in | Wt 118.6 lb

## 2018-06-09 DIAGNOSIS — E782 Mixed hyperlipidemia: Secondary | ICD-10-CM | POA: Diagnosis not present

## 2018-06-09 DIAGNOSIS — E034 Atrophy of thyroid (acquired): Secondary | ICD-10-CM

## 2018-06-09 DIAGNOSIS — R7303 Prediabetes: Secondary | ICD-10-CM | POA: Diagnosis not present

## 2018-06-09 DIAGNOSIS — I10 Essential (primary) hypertension: Secondary | ICD-10-CM

## 2018-06-09 DIAGNOSIS — J449 Chronic obstructive pulmonary disease, unspecified: Secondary | ICD-10-CM

## 2018-06-09 DIAGNOSIS — Z23 Encounter for immunization: Secondary | ICD-10-CM | POA: Diagnosis not present

## 2018-06-09 LAB — COMPREHENSIVE METABOLIC PANEL
ALT: 17 U/L (ref 0–35)
AST: 21 U/L (ref 0–37)
Albumin: 4.5 g/dL (ref 3.5–5.2)
Alkaline Phosphatase: 70 U/L (ref 39–117)
BUN: 16 mg/dL (ref 6–23)
CO2: 31 mEq/L (ref 19–32)
Calcium: 10.2 mg/dL (ref 8.4–10.5)
Chloride: 102 mEq/L (ref 96–112)
Creatinine, Ser: 0.77 mg/dL (ref 0.40–1.20)
GFR: 77.03 mL/min (ref >60.00)
Glucose, Bld: 107 mg/dL — ABNORMAL HIGH (ref 70–99)
Potassium: 4 mEq/L (ref 3.5–5.1)
Sodium: 141 mEq/L (ref 135–145)
Total Bilirubin: 0.5 mg/dL (ref 0.2–1.2)
Total Protein: 7.1 g/dL (ref 6.0–8.3)

## 2018-06-09 LAB — LIPID PANEL
Cholesterol: 169 mg/dL (ref 0–200)
HDL: 65.2 mg/dL (ref >39.00)
LDL Cholesterol: 86 mg/dL (ref 0–99)
NonHDL: 103.33
Total CHOL/HDL Ratio: 3
Triglycerides: 89 mg/dL (ref 0.0–149.0)
VLDL: 17.8 mg/dL (ref 0.0–40.0)

## 2018-06-09 LAB — CBC
HCT: 42.7 % (ref 36.0–46.0)
Hemoglobin: 14.8 g/dL (ref 12.0–15.0)
MCHC: 34.8 g/dL (ref 30.0–36.0)
MCV: 96.7 fl (ref 78.0–100.0)
Platelets: 299 10*3/uL (ref 150.0–400.0)
RBC: 4.41 Mil/uL (ref 3.87–5.11)
RDW: 12.6 % (ref 11.5–15.5)
WBC: 8.1 10*3/uL (ref 4.0–10.5)

## 2018-06-09 LAB — HEMOGLOBIN A1C: Hgb A1c MFr Bld: 6 % (ref 4.6–6.5)

## 2018-06-09 LAB — TSH: TSH: 0.27 u[IU]/mL — ABNORMAL LOW (ref 0.35–4.50)

## 2018-06-09 NOTE — Patient Instructions (Addendum)
Good to see you today!  I will be in touch with your labs When I get your thyroid back in we can refill your thyroid med BP is under ok control If you decide you do want to see pulmonology for an evaluation please let me know  Please see me in about 6 months for a physical

## 2018-06-10 ENCOUNTER — Other Ambulatory Visit: Payer: Self-pay | Admitting: Family Medicine

## 2018-06-10 DIAGNOSIS — Z760 Encounter for issue of repeat prescription: Secondary | ICD-10-CM

## 2018-06-11 ENCOUNTER — Encounter: Payer: Self-pay | Admitting: Family Medicine

## 2018-06-11 MED ORDER — LEVOTHYROXINE SODIUM 50 MCG PO TABS: 50.0000 ug | ORAL_TABLET | Freq: Every day | ORAL | 6 refills | Status: DC

## 2018-06-11 NOTE — Addendum Note (Signed)
Addended by: Lamar Blinks C on: 06/11/2018 06:24 AM   Modules accepted: Orders

## 2018-06-23 DIAGNOSIS — R69 Illness, unspecified: Secondary | ICD-10-CM | POA: Diagnosis not present

## 2018-07-15 ENCOUNTER — Other Ambulatory Visit: Payer: Self-pay | Admitting: Family Medicine

## 2018-07-15 DIAGNOSIS — Z1231 Encounter for screening mammogram for malignant neoplasm of breast: Secondary | ICD-10-CM

## 2018-07-17 ENCOUNTER — Encounter: Payer: Self-pay | Admitting: Family Medicine

## 2018-07-19 ENCOUNTER — Other Ambulatory Visit (INDEPENDENT_AMBULATORY_CARE_PROVIDER_SITE_OTHER): Payer: Medicare HMO

## 2018-07-19 DIAGNOSIS — E034 Atrophy of thyroid (acquired): Secondary | ICD-10-CM

## 2018-07-19 LAB — TSH: TSH: 2.08 u[IU]/mL (ref 0.35–4.50)

## 2018-07-20 ENCOUNTER — Encounter: Payer: Self-pay | Admitting: Family Medicine

## 2018-08-01 ENCOUNTER — Encounter: Payer: Self-pay | Admitting: Family Medicine

## 2018-08-01 ENCOUNTER — Encounter: Payer: Self-pay | Admitting: Gastroenterology

## 2018-08-01 ENCOUNTER — Ambulatory Visit (INDEPENDENT_AMBULATORY_CARE_PROVIDER_SITE_OTHER): Payer: Medicare HMO | Admitting: Family Medicine

## 2018-08-01 VITALS — BP 140/82 | HR 88 | Temp 98.0°F | Resp 20 | Ht 64.0 in | Wt 121.0 lb

## 2018-08-01 DIAGNOSIS — J441 Chronic obstructive pulmonary disease with (acute) exacerbation: Secondary | ICD-10-CM | POA: Diagnosis not present

## 2018-08-01 DIAGNOSIS — H40013 Open angle with borderline findings, low risk, bilateral: Secondary | ICD-10-CM | POA: Diagnosis not present

## 2018-08-01 DIAGNOSIS — H353111 Nonexudative age-related macular degeneration, right eye, early dry stage: Secondary | ICD-10-CM | POA: Diagnosis not present

## 2018-08-01 DIAGNOSIS — H43823 Vitreomacular adhesion, bilateral: Secondary | ICD-10-CM | POA: Diagnosis not present

## 2018-08-01 MED ORDER — DOXYCYCLINE HYCLATE 100 MG PO CAPS: 100.0000 mg | ORAL_CAPSULE | Freq: Two times a day (BID) | ORAL | 0 refills | Status: DC

## 2018-08-01 MED ORDER — PREDNISONE 20 MG PO TABS: ORAL_TABLET | ORAL | 0 refills | Status: DC

## 2018-08-01 NOTE — Progress Notes (Signed)
[place] at [facility] [address], [place], [place] [address] [phone]  Date:  08/01/2018   Name:  Christine Keller   DOB:  [birth year]   MRN:  [id number]  PCP:  Christine Mclean, MD    Chief Complaint: Cough (congestion, phlem, babysitting-child had ear infection and cough, )   History of Present Illness:  Christine Keller is a 77 y.o. very pleasant female patient who presents with the following:  history of COPD, tobacco abuse  Here today with concern of cough- they think she caught something from her grand-daughter.  Grand-daughter has a cold and otitis media, cough [name] noted a cough as of yesterday So far cough is non- productive and she feels quite congested She is not wheezing or feeling more tight No fever noted  She is using Spiriva and does have albuterol on hand to use  She also does have dueonebs on hand to use if needed   She has PRE-diabetes, most recent A1c ws 6%  From our last visit in September: Her husband had a very major operation in July of this year- his bladder and prostate were removed She is doing ok and handling this stress adequately  She is having COPD symptoms with exertion but feel that these are stable  She has not needed her albuterol or neb very often She does not feel like her breathing is impeding her from her activities Her pulmonary function seems to be stable She does have to rest between activities but is ok with doing this Continues to smoke  Lab Results  Component Value Date   TSH 2.08 07/19/2018    Patient Active Problem List   Diagnosis Date Noted  . Routine general medical examination at a health care facility 08/14/2015  . Hypothyroidism 12/05/2014  . Hyperlipidemia 08/08/2014  . Osteopenia 12/27/2013  . GERD (gastroesophageal reflux disease) 12/27/2013  . Guaiac positive stools 12/27/2013  . Ovarian tumor 05/16/2012  . Borderline diabetes 04/16/2011  . Preventative  health care 04/15/2011  . Hyperglycemia 04/15/2011  . Essential hypertension 12/22/2010  . Carpal tunnel syndrome 12/22/2010  . COPD (chronic obstructive pulmonary disease) ([place]) 11/15/2009  . POLYCYTHEMIA 10/30/2009  . TOBACCO ABUSE 10/30/2009  . DYSPNEA 10/30/2009    Past Medical History:  Diagnosis Date  . Bilateral carpal tunnel syndrome   . COPD (chronic obstructive pulmonary disease) ([place]) 1.31.11   PFT FEV1 1.19(56%), FVC 2.68(90%), FEV1% 44, TLC 5.80(115%), DLCO 52%  . Hyperlipidemia   . Hypertension   . Osteoporosis    OSTEOPENIA  . Ovarian tumor 2014   Testosterone secreting ovarain tumor  . Thyroid nodule   . Tobacco abuse     Past Surgical History:  Procedure Laterality Date  . CARPAL TUNNEL RELEASE  05/26/2012   Procedure: CARPAL TUNNEL RELEASE;  Surgeon: Cammie Sickle., MD;  Location: [place];  Service: Orthopedics;  Laterality: Left;  Injection of Right thumb Carpal-Metacarpal joint also  . CARPAL TUNNEL RELEASE  08/16/2012   Procedure: CARPAL TUNNEL RELEASE;  Surgeon: Cammie Sickle., MD;  Location: [place];  Service: Orthopedics;  Laterality: Right;  Inject Left Thumb Carpal Metacarpal Joint  . OTHER SURGICAL HISTORY  12/14/12   bilateral oopherectomy [facility]  . STERIOD INJECTION  08/16/2012   Procedure: STEROID INJECTION;  Surgeon: Cammie Sickle., MD;  Location: [place];  Service: Orthopedics;  Laterality: Left;  Inject Left Thumb Carpal  Metacarpal Joint  . TONSILLECTOMY    . ULNAR NERVE TRANSPOSITION  05/26/2012   Procedure: ULNAR NERVE DECOMPRESSION/TRANSPOSITION;  Surgeon: Cammie Sickle., MD;  Location: [place];  Service: Orthopedics;  Laterality: Left;    Social History   Tobacco Use  . Smoking status: Current Every Day Smoker    Packs/day: 1.00    Types: Cigarettes  . Smokeless tobacco: Never Used  . Tobacco comment: Less than 1 ppd.  Substance Use Topics  .  Alcohol use: Yes    Alcohol/week: 7.0 standard drinks    Types: 7 Glasses of wine per week  . Drug use: No    Family History  Problem Relation Age of Onset  . Heart disease Mother   . Hypertension Mother   . Asthma Father   . Emphysema Father   . Heart disease Brother     Allergies  Allergen Reactions  . Codeine     REACTION: hallucinations  . Niacin     REACTION: rash    Medication list has been reviewed and updated.  Current Outpatient Medications on File Prior to Visit  Medication Sig Dispense Refill  . Albuterol Sulfate (PROAIR RESPICLICK) 740 (90 Base) MCG/ACT AEPB Inhale 2 puffs into the lungs every 6 (six) hours as needed (wheezing). 1 each 5  . amLODipine (NORVASC) 10 MG tablet Take 1 tablet (10 mg total) by mouth daily. 90 tablet 3  . CALCIUM-MAGNESIUM-ZINC PO Take by mouth daily.    . Cholecalciferol (VITAMIN D3) 2000 UNITS capsule Take 2,000 Units by mouth daily.    . Coenzyme Q10 (COQ10) 200 MG CAPS Take 1 capsule by mouth daily.      . Garlic Oil (ODORLESS GARLIC) 814 MG TABS Take 1 tablet by mouth daily.      Nyoka Cowden Tea 315 MG CAPS Take 1 capsule by mouth daily.      Marland Kitchen ipratropium-albuterol (DUONEB) 0.5-2.5 (3) MG/3ML SOLN Take 3 mLs by nebulization 3 (three) times daily as needed. Use when needed for shortness of breath exacerbation 360 mL 1  . levothyroxine (SYNTHROID, LEVOTHROID) 50 MCG tablet Take 1 tablet (50 mcg total) by mouth daily. 30 tablet 6  . Multiple Vitamin (MULTIVITAMIN) tablet Take 1 tablet by mouth daily.      . OMEGA 3 1000 MG CAPS Take 1 capsule by mouth 2 (two) times daily.      . Probiotic Product (PROBIOTIC DAILY PO) Take by mouth daily.    . ranitidine (ZANTAC) 150 MG tablet Take 150 mg by mouth 2 (two) times daily.    . simvastatin (ZOCOR) 20 MG tablet Take 0.5 tablets (10 mg total) by mouth daily. 90 tablet 3  . Thiamine HCl (VITAMIN B-1) 100 MG tablet Take 100 mg by mouth daily.      Marland Kitchen tiotropium (SPIRIVA HANDIHALER) 18 MCG  inhalation capsule Place 1 capsule (18 mcg total) into inhaler and inhale daily. 90 capsule 3  . valsartan-hydrochlorothiazide (DIOVAN-HCT) 160-25 MG tablet Take 1 tablet by mouth daily. 90 tablet 3  . Vitamin Mixture (ESTER-C) 500-60 MG TABS Take 1 tablet by mouth daily.       No current facility-administered medications on file prior to visit.     Review of Systems:  As per HPI- otherwise negative. No fever  Some fatigue No vomiting or diarrhea    Physical Examination: Vitals:   08/01/18 1117  BP: 140/82  Pulse: 88  Resp: 20  Temp: 98 F (36.7 C)  SpO2: 95%  Vitals:   08/01/18 1117  Weight: 121 lb (54.9 kg)  Height: 5\' 4"  (1.626 m)   Body mass index is 20.77 kg/m. Ideal Body Weight: Weight in (lb) to have BMI = 25: 145.3  GEN: WDWN, NAD, Non-toxic, A & O x 3, coughing in room but otherwise looks well, her normal self  HEENT: Atraumatic, Normocephalic. Neck supple. No masses, No LAD.  Bilateral TM wnl, oropharynx normal.  PEERL,EOMI.   Ears and Nose: No external deformity. CV: RRR, No M/G/R. No JVD. No thrill. No extra heart sounds. PULM: mild wheezing bilaterally but no crackles, rhonchi. No retractions. No resp. distress. No accessory muscle use. EXTR: No c/c/e NEURO Normal gait.  PSYCH: Normally interactive. Conversant. Not depressed or anxious appearing.  Calm demeanor.    Assessment and Plan: COPD exacerbation ([place]) - Plan: predniSONE (DELTASONE) 20 MG tablet, doxycycline (VIBRAMYCIN) 100 MG capsule  COPD exacerbation- treat with prednisone for 6 days Gave doxy rx to hold- if not better in a couple of days fill and use this as well   Meds ordered this encounter  Medications  . predniSONE (DELTASONE) 20 MG tablet    Sig: Take 2 pills a day for 3 days, then 1 pill a day for 3 days    Dispense:  9 tablet    Refill:  0  . doxycycline (VIBRAMYCIN) 100 MG capsule    Sig: Take 1 capsule (100 mg total) by mouth 2 (two) times daily.    Dispense:  20 capsule     Refill:  0   Went over her nebs and inhaler, how to use for exacerbation  See patient instructions for more details.     Signed Lamar Blinks, MD

## 2018-08-01 NOTE — Patient Instructions (Signed)
Good to see you today- however I am sorry that you are not feeling well! We are going to treat you for a COPD exacerbation  -prednisone -if not better in a couple of days please fill and use the doxycycline  Continue spiriva duonebs as needed Can use albuterol if you don't need the neb  Let me know if not improving in the next couple of days- Sooner if worse.

## 2018-08-03 ENCOUNTER — Encounter: Payer: Self-pay | Admitting: Family Medicine

## 2018-08-08 ENCOUNTER — Encounter: Payer: Self-pay | Admitting: Family Medicine

## 2018-08-08 DIAGNOSIS — J441 Chronic obstructive pulmonary disease with (acute) exacerbation: Secondary | ICD-10-CM

## 2018-08-18 ENCOUNTER — Encounter: Payer: Self-pay | Admitting: Pulmonary Disease

## 2018-08-18 ENCOUNTER — Ambulatory Visit (HOSPITAL_BASED_OUTPATIENT_CLINIC_OR_DEPARTMENT_OTHER)
Admission: RE | Admit: 2018-08-18 | Discharge: 2018-08-18 | Disposition: A | Discharge status: Home / Self Care | Payer: Medicare HMO | Source: Ambulatory Visit | Attending: Pulmonary Disease | Admitting: Pulmonary Disease

## 2018-08-18 ENCOUNTER — Ambulatory Visit: Payer: Medicare HMO | Admitting: Pulmonary Disease

## 2018-08-18 VITALS — BP 124/70 | HR 79 | Ht 65.0 in | Wt 121.0 lb

## 2018-08-18 DIAGNOSIS — R0602 Shortness of breath: Secondary | ICD-10-CM | POA: Diagnosis not present

## 2018-08-18 DIAGNOSIS — F17210 Nicotine dependence, cigarettes, uncomplicated: Secondary | ICD-10-CM | POA: Diagnosis not present

## 2018-08-18 DIAGNOSIS — R69 Illness, unspecified: Secondary | ICD-10-CM | POA: Diagnosis not present

## 2018-08-18 DIAGNOSIS — J449 Chronic obstructive pulmonary disease, unspecified: Secondary | ICD-10-CM | POA: Diagnosis not present

## 2018-08-18 DIAGNOSIS — I700 Atherosclerosis of aorta: Secondary | ICD-10-CM | POA: Diagnosis not present

## 2018-08-18 DIAGNOSIS — F17200 Nicotine dependence, unspecified, uncomplicated: Secondary | ICD-10-CM | POA: Diagnosis not present

## 2018-08-18 MED ORDER — TIOTROPIUM BROMIDE-OLODATEROL 2.5-2.5 MCG/ACT IN AERS: 2.0000 | INHALATION_SPRAY | Freq: Every day | RESPIRATORY_TRACT | 5 refills | Status: DC

## 2018-08-18 MED ORDER — TIOTROPIUM BROMIDE-OLODATEROL 2.5-2.5 MCG/ACT IN AERS: 2.0000 | INHALATION_SPRAY | RESPIRATORY_TRACT | 3 refills | Status: AC

## 2018-08-18 MED ORDER — TIOTROPIUM BROMIDE-OLODATEROL 2.5-2.5 MCG/ACT IN AERS: 2.0000 | INHALATION_SPRAY | Freq: Once | RESPIRATORY_TRACT | 3 refills | Status: AC

## 2018-08-18 MED ORDER — TIOTROPIUM BROMIDE-OLODATEROL 2.5-2.5 MCG/ACT IN AERS: 2.0000 | INHALATION_SPRAY | Freq: Every day | RESPIRATORY_TRACT | 5 refills | Status: AC

## 2018-08-18 NOTE — Patient Instructions (Signed)
Allergic rhinitis (sinus congestion, allergies): Use Neil Med rinses with distilled water at least twice per day using the instructions on the package. 1/2 hour after using the [facility] Med rinse, use Nasacort two puffs in each nostril once per day.  Remember that the Nasacort can take 1-2 weeks to work after regular use. Use generic zyrtec (cetirizine) every day.  If this doesn't help, then stop taking it and use chlorpheniramine-phenylephrine combination tablets.  COPD:  Spirometry test today Stop taking Spiriva Start taking Stiolto Keep using albuterol as needed for chest tightness wheezing or shortness of breath Practice good hand hygiene Stay active I am glad your immunizations are up-to-date  Shortness of breath: Chest x-ray today Check oxygen level while walking today  Cigarette smoking: It really important to stop smoking right away to prevent the COPD from getting worse  We will see you back in 2 to 3 weeks to see how you are doing with Stiolto or sooner if needed.

## 2018-08-18 NOTE — Progress Notes (Signed)
Synopsis: Referred in 08/2018 for COPD; she smoked several packs of cigarettes daily from [place] through about 2011, since 2011 has smoked less than 1 pack/day, was smoking 5 cigarettes a day as of August 18, 2018.  Subjective:   PATIENT ID: Christine Keller GENDER: female DOB: 1941/06/17, MRN: [id number]   HPI  Chief Complaint  Patient presents with  . pulm consult    This is a pleasant 77 year old female who currently smokes cigarettes and has a diagnosis of COPD who is coming to me today for evaluation of progressive shortness of breath.  She tells me that she was diagnosed with COPD in 2011 and initially was just treated with albuterol as needed.  She says around that time her son was married and she was able to dance and his wedding without having any difficulty with shortness of breath.  Over the years she was monitored with lung function testing and she says that to her knowledge her disease never progressed.  However, in mid 2018 she started having worsening symptoms and she says that for the last 18 months she has been experiencing worsening shortness of breath on exertion.  She is now at a point where she has to pace herself in order to do activities around the house such as laundry or cleaning.  She says that she is still capable of climbing a flight of stairs and carrying in groceries but routine activities like laundry or anything that takes a significant duration of motion causes her to pause after about 5 minutes to catch her breath.  She says that for the last several weeks she has been coughing, she has been coughing up mucus.  She says this started around the time that she was sick with bronchitis.  She had to go to her primary care physician and was treated with prednisone and doxycycline.  She did not take the prednisone because she says that it interfered with her sleep but she did complete the course of doxycycline and she says the cough and mucus production have improved.  However,  she says that for the last several months she has experienced a nighttime cough.  She also says that she has significant sinus congestion and her husband wonders if she has allergies.  She does not take any allergy medicines.  She currently takes Spiriva daily and uses albuterol as needed for shortness of breath and then routinely uses albuterol prior to bedtime.  She gets the medicine from [place].  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  Past Medical History:  Diagnosis Date  . Bilateral carpal tunnel syndrome   . COPD (chronic obstructive pulmonary disease) ([place]) 1.31.11   PFT FEV1 1.19(56%), FVC 2.68(90%), FEV1% 44, TLC 5.80(115%), DLCO 52%  . Hyperlipidemia   . Hypertension   . Osteoporosis    OSTEOPENIA  . Ovarian tumor 2014   Testosterone secreting ovarain tumor  . Thyroid nodule   . Tobacco abuse      Family History  Problem Relation Age of Onset  . Heart disease Mother   . Hypertension Mother   . Asthma Father   . Emphysema Father   . Heart disease Brother      Social History   Socioeconomic History  . Marital status: Married    Spouse name: Not on file  . Number of children: Not on file  . Years of education: Not on file  . Highest education level: Not on file  Occupational History  . Occupation: Retired  Employer: RETIRED    Comment: Network engineer  Social Needs  . Financial resource strain: Not on file  . Food insecurity:    Worry: Not on file    Inability: Not on file  . Transportation needs:    Medical: Not on file    Non-medical: Not on file  Tobacco Use  . Smoking status: Current Every Day Smoker    Packs/day: 1.00    Years: 60.00    Pack years: 60.00    Types: Cigarettes  . Smokeless tobacco: Never Used  . Tobacco comment: Less than 1 ppd.  Substance and Sexual Activity  . Alcohol use: Yes    Alcohol/week: 7.0 standard drinks    Types: 7 Glasses of wine per week  . Drug use: No  . Sexual activity: Not on file  Lifestyle  .  Physical activity:    Days per week: Not on file    Minutes per session: Not on file  . Stress: Not on file  Relationships  . Social connections:    Talks on phone: Not on file    Gets together: Not on file    Attends religious service: Not on file    Active member of club or organization: Not on file    Attends meetings of clubs or organizations: Not on file    Relationship status: Not on file  . Intimate partner violence:    Fear of current or ex partner: Not on file    Emotionally abused: Not on file    Physically abused: Not on file    Forced sexual activity: Not on file  Other Topics Concern  . Not on file  Social History Narrative   Regular exercise:  3 x weekly   Caffeine Use:  occasional     Allergies  Allergen Reactions  . Prednisone Shortness Of Breath    Per pt. Report 08/03/18  . Codeine     REACTION: hallucinations  . Niacin     REACTION: rash     Outpatient Medications Prior to Visit  Medication Sig Dispense Refill  . Albuterol Sulfate (PROAIR RESPICLICK) 382 (90 Base) MCG/ACT AEPB Inhale 2 puffs into the lungs every 6 (six) hours as needed (wheezing). 1 each 5  . amLODipine (NORVASC) 10 MG tablet Take 1 tablet (10 mg total) by mouth daily. 90 tablet 3  . CALCIUM-MAGNESIUM-ZINC PO Take by mouth daily.    . Cholecalciferol (VITAMIN D3) 2000 UNITS capsule Take 2,000 Units by mouth daily.    . Coenzyme Q10 (COQ10) 200 MG CAPS Take 1 capsule by mouth daily.      Marland Kitchen doxycycline (VIBRAMYCIN) 100 MG capsule Take 1 capsule (100 mg total) by mouth 2 (two) times daily. 20 capsule 0  . Garlic Oil (ODORLESS GARLIC) 505 MG TABS Take 1 tablet by mouth daily.      Nyoka Cowden Tea 315 MG CAPS Take 1 capsule by mouth daily.      Marland Kitchen ipratropium-albuterol (DUONEB) 0.5-2.5 (3) MG/3ML SOLN Take 3 mLs by nebulization 3 (three) times daily as needed. Use when needed for shortness of breath exacerbation 360 mL 1  . levothyroxine (SYNTHROID, LEVOTHROID) 50 MCG tablet Take 1 tablet (50 mcg  total) by mouth daily. 30 tablet 6  . Multiple Vitamin (MULTIVITAMIN) tablet Take 1 tablet by mouth daily.      . OMEGA 3 1000 MG CAPS Take 1 capsule by mouth 2 (two) times daily.      . predniSONE (DELTASONE) 20 MG tablet Take 2  pills a day for 3 days, then 1 pill a day for 3 days 9 tablet 0  . Probiotic Product (PROBIOTIC DAILY PO) Take by mouth daily.    . ranitidine (ZANTAC) 150 MG tablet Take 150 mg by mouth 2 (two) times daily.    . simvastatin (ZOCOR) 20 MG tablet Take 0.5 tablets (10 mg total) by mouth daily. 90 tablet 3  . Thiamine HCl (VITAMIN B-1) 100 MG tablet Take 100 mg by mouth daily.      Marland Kitchen tiotropium (SPIRIVA HANDIHALER) 18 MCG inhalation capsule Place 1 capsule (18 mcg total) into inhaler and inhale daily. 90 capsule 3  . valsartan-hydrochlorothiazide (DIOVAN-HCT) 160-25 MG tablet Take 1 tablet by mouth daily. 90 tablet 3  . Vitamin Mixture (ESTER-C) 500-60 MG TABS Take 1 tablet by mouth daily.       No facility-administered medications prior to visit.     Review of Systems  Constitutional: Negative for chills, fever, malaise/fatigue and weight loss.  HENT: Negative for congestion, nosebleeds, sinus pain and sore throat.   Eyes: Negative for photophobia, pain and discharge.  Respiratory: Positive for cough and shortness of breath. Negative for hemoptysis, sputum production and wheezing.   Cardiovascular: Negative for chest pain, palpitations, orthopnea and leg swelling.  Gastrointestinal: Positive for constipation. Negative for abdominal pain, diarrhea, nausea and vomiting.  Genitourinary: Negative for dysuria, frequency, hematuria and urgency.  Musculoskeletal: Negative for back pain, joint pain, myalgias and neck pain.  Skin: Negative for itching and rash.  Neurological: Negative for tingling, tremors, sensory change, speech change, focal weakness, seizures, weakness and headaches.  Psychiatric/Behavioral: Negative for memory loss, substance abuse and suicidal ideas. The  patient is not nervous/anxious.       Objective:  Physical Exam   Vitals:   08/18/18 1624  BP: 124/70  Pulse: 79  SpO2: 95%  Weight: 121 lb (54.9 kg)  Height: 5\' 5"  (1.651 m)    RA  Gen: well appearing, no acute distress HENT: NCAT, OP clear, neck supple without masses Eyes: PERRL, EOMi Lymph: no cervical lymphadenopathy PULM: Poor air movement, no wheezing CV: RRR, no mgr, no JVD GI: BS+, soft, nontender, no hsm Derm: no rash or skin breakdown MSK: normal bulk and tone Neuro: A&Ox4, CN II-XII intact, strength 5/5 in all 4 extremities Psyche: normal mood and affect   CBC    Component Value Date/Time   WBC 8.1 06/09/2018 1018   RBC 4.41 06/09/2018 1018   HGB 14.8 06/09/2018 1018   HCT 42.7 06/09/2018 1018   PLT 299.0 06/09/2018 1018   MCV 96.7 06/09/2018 1018   MCHC 34.8 06/09/2018 1018   RDW 12.6 06/09/2018 1018   LYMPHSABS 2.9 07/19/2015 0952   MONOABS 0.7 07/19/2015 0952   EOSABS 0.7 07/19/2015 0952   BASOSABS 0.0 07/19/2015 0952     Chest imaging:  PFT: 06/2013 Ratio 51% FEV1 1.3L (61% pred)  Labs:  Path:  Echo:  Heart Catheterization:  Records from her visit with her PCP in 07/2018 reviewed where she was seen for a COPD exacerbation and treated with prednisone and doxycycline.  It was noted that she used Spiriva and albuterol.      Assessment & Plan:   Chronic obstructive pulmonary disease, unspecified COPD type ([place]) - Plan: DG Chest 2 View, Spirometry with Graph, Tiotropium Bromide-Olodaterol (STIOLTO RESPIMAT) 2.5-2.5 MCG/ACT AERS  DYSPNEA - Plan: DG Chest 2 View, Spirometry with Graph, Tiotropium Bromide-Olodaterol (STIOLTO RESPIMAT) 2.5-2.5 MCG/ACT AERS  Cigarette smoker  TOBACCO ABUSE  Discussion: This  is a pleasant 77 year old female with COPD who is experiencing progressive symptoms in the setting of ongoing tobacco abuse.  I told her today that continued cigarette smoking causes her lungs to "age" at a faster rate and have  worsening COPD progression.  Fortunately she has only had one exacerbation of COPD.  Her worsening shortness of breath is due to progressive COPD.  That being said, the differential diagnosis includes malignancy or cardiac condition.  Based on her physical exam I suspected stress COPD but will get a chest x-ray to make sure there is not something else going on.  Considering the fact that she has progressive symptoms with moderate airflow obstruction I think she needs to have therapy changed from Spiriva to [employer].  We covered the differences between controller and rescue medicines in depth today.  Her immunizations are up-to-date.  She really needs to quit smoking.  Allergic rhinitis (sinus congestion, allergies): Use Neil Med rinses with distilled water at least twice per day using the instructions on the package. 1/2 hour after using the [facility] Med rinse, use Nasacort two puffs in each nostril once per day.  Remember that the Nasacort can take 1-2 weeks to work after regular use. Use generic zyrtec (cetirizine) every day.  If this doesn't help, then stop taking it and use chlorpheniramine-phenylephrine combination tablets.  COPD:  Spirometry test today Stop taking Spiriva Start taking Stiolto Keep using albuterol as needed for chest tightness wheezing or shortness of breath Practice good hand hygiene Stay active I am glad your immunizations are up-to-date  Shortness of breath: Chest x-ray today Check oxygen level while walking today  Cigarette smoking: It really important to stop smoking right away to prevent the COPD from getting worse  We will see you back in 2 to 3 weeks to see how you are doing with Stiolto or sooner if needed.      Current Outpatient Medications:  .  Albuterol Sulfate (PROAIR RESPICLICK) 062 (90 Base) MCG/ACT AEPB, Inhale 2 puffs into the lungs every 6 (six) hours as needed (wheezing)., Disp: 1 each, Rfl: 5 .  amLODipine (NORVASC) 10 MG tablet, Take 1 tablet  (10 mg total) by mouth daily., Disp: 90 tablet, Rfl: 3 .  CALCIUM-MAGNESIUM-ZINC PO, Take by mouth daily., Disp: , Rfl:  .  Cholecalciferol (VITAMIN D3) 2000 UNITS capsule, Take 2,000 Units by mouth daily., Disp: , Rfl:  .  Coenzyme Q10 (COQ10) 200 MG CAPS, Take 1 capsule by mouth daily.  , Disp: , Rfl:  .  doxycycline (VIBRAMYCIN) 100 MG capsule, Take 1 capsule (100 mg total) by mouth 2 (two) times daily., Disp: 20 capsule, Rfl: 0 .  Garlic Oil (ODORLESS GARLIC) 694 MG TABS, Take 1 tablet by mouth daily.  , Disp: , Rfl:  .  Green Tea 315 MG CAPS, Take 1 capsule by mouth daily.  , Disp: , Rfl:  .  ipratropium-albuterol (DUONEB) 0.5-2.5 (3) MG/3ML SOLN, Take 3 mLs by nebulization 3 (three) times daily as needed. Use when needed for shortness of breath exacerbation, Disp: 360 mL, Rfl: 1 .  levothyroxine (SYNTHROID, LEVOTHROID) 50 MCG tablet, Take 1 tablet (50 mcg total) by mouth daily., Disp: 30 tablet, Rfl: 6 .  Multiple Vitamin (MULTIVITAMIN) tablet, Take 1 tablet by mouth daily.  , Disp: , Rfl:  .  OMEGA 3 1000 MG CAPS, Take 1 capsule by mouth 2 (two) times daily.  , Disp: , Rfl:  .  predniSONE (DELTASONE) 20 MG tablet, Take 2 pills a day  for 3 days, then 1 pill a day for 3 days, Disp: 9 tablet, Rfl: 0 .  Probiotic Product (PROBIOTIC DAILY PO), Take by mouth daily., Disp: , Rfl:  .  ranitidine (ZANTAC) 150 MG tablet, Take 150 mg by mouth 2 (two) times daily., Disp: , Rfl:  .  simvastatin (ZOCOR) 20 MG tablet, Take 0.5 tablets (10 mg total) by mouth daily., Disp: 90 tablet, Rfl: 3 .  Thiamine HCl (VITAMIN B-1) 100 MG tablet, Take 100 mg by mouth daily.  , Disp: , Rfl:  .  tiotropium (SPIRIVA HANDIHALER) 18 MCG inhalation capsule, Place 1 capsule (18 mcg total) into inhaler and inhale daily., Disp: 90 capsule, Rfl: 3 .  valsartan-hydrochlorothiazide (DIOVAN-HCT) 160-25 MG tablet, Take 1 tablet by mouth daily., Disp: 90 tablet, Rfl: 3 .  Vitamin Mixture (ESTER-C) 500-60 MG TABS, Take 1 tablet by  mouth daily.  , Disp: , Rfl:  .  Tiotropium Bromide-Olodaterol (STIOLTO RESPIMAT) 2.5-2.5 MCG/ACT AERS, Inhale 2 puffs into the lungs daily., Disp: 1 Inhaler, Rfl: 5

## 2018-08-18 NOTE — Addendum Note (Signed)
Addended by: Georjean Mode on: 08/18/2018 05:42 PM   Modules accepted: Orders

## 2018-08-19 ENCOUNTER — Telehealth: Payer: Self-pay | Admitting: Pulmonary Disease

## 2018-08-19 ENCOUNTER — Telehealth: Payer: Self-pay | Admitting: Family Medicine

## 2018-08-19 NOTE — Telephone Encounter (Signed)
Attempted to call pt's husband Berneta Sages but unable to reach him. Left message for Berneta Sages to return call.

## 2018-08-19 NOTE — Telephone Encounter (Signed)
Copied from [place] [id number]. Topic: Quick Communication - See Telephone Encounter >> Aug 19, 2018 11:27 AM Ahmed Prima L wrote: CRM for notification. See Telephone encounter for: 08/19/18.  [name] is calling for a prior authorization on Tiotropium Bromide-Olodaterol (STIOLTO RESPIMAT) 2.5-2.5 MCG/ACT AERS. She will be faxing as well.

## 2018-08-19 NOTE — Telephone Encounter (Signed)
Stiolto prescribed by pulmonary.

## 2018-08-23 NOTE — Telephone Encounter (Signed)
Attempted to contact pt's husband, Berneta Sages. I did not receive an answer. I have left a message for him to return our call.

## 2018-08-24 NOTE — Telephone Encounter (Signed)
Attempted to call pt but unable to reach her. Left message for pt to return call. 

## 2018-08-24 NOTE — Telephone Encounter (Signed)
Patent returning call , per Patient did not make it to cell fast enough

## 2018-08-24 NOTE — Telephone Encounter (Signed)
Called and spoke with patient she stated that her insurance will not cover the inhaler stiolto. I have sent in  PA through covermymeds. I have asked that the patient contact her insurance company to see what they will cover verses stiolto incase the PA is rejected. Patient verbalized understanding. Nothing further needed at this time.   Medication name and strength: Stiolto Respimat 2.5 Provider: Simonne Maffucci  Pharmacy: [facility] Patient insurance ID: [id number] Phone:  Fax:  Was the PA started on CMM?  yes If yes, please enter the Key: [id number] Timeframe for approval/denial: 48-72 hours

## 2018-08-25 ENCOUNTER — Ambulatory Visit (HOSPITAL_BASED_OUTPATIENT_CLINIC_OR_DEPARTMENT_OTHER)
Admission: RE | Admit: 2018-08-25 | Discharge: 2018-08-25 | Disposition: A | Discharge status: Home / Self Care | Payer: Medicare HMO | Source: Ambulatory Visit | Attending: Family Medicine | Admitting: Family Medicine

## 2018-08-25 DIAGNOSIS — Z1231 Encounter for screening mammogram for malignant neoplasm of breast: Secondary | ICD-10-CM | POA: Diagnosis not present

## 2018-08-29 ENCOUNTER — Other Ambulatory Visit: Payer: Self-pay

## 2018-08-30 ENCOUNTER — Other Ambulatory Visit: Payer: Self-pay | Admitting: Family Medicine

## 2018-08-30 ENCOUNTER — Telehealth: Payer: Self-pay | Admitting: Pulmonary Disease

## 2018-08-30 ENCOUNTER — Ambulatory Visit (INDEPENDENT_AMBULATORY_CARE_PROVIDER_SITE_OTHER): Payer: Medicare HMO | Admitting: Pulmonary Disease

## 2018-08-30 ENCOUNTER — Encounter: Payer: Self-pay | Admitting: Pulmonary Disease

## 2018-08-30 VITALS — BP 148/78 | HR 74 | Ht 65.0 in | Wt 122.0 lb

## 2018-08-30 DIAGNOSIS — R0602 Shortness of breath: Secondary | ICD-10-CM

## 2018-08-30 DIAGNOSIS — F17210 Nicotine dependence, cigarettes, uncomplicated: Secondary | ICD-10-CM | POA: Diagnosis not present

## 2018-08-30 DIAGNOSIS — J449 Chronic obstructive pulmonary disease, unspecified: Secondary | ICD-10-CM | POA: Diagnosis not present

## 2018-08-30 DIAGNOSIS — I10 Essential (primary) hypertension: Secondary | ICD-10-CM

## 2018-08-30 DIAGNOSIS — Z760 Encounter for issue of repeat prescription: Secondary | ICD-10-CM

## 2018-08-30 DIAGNOSIS — R69 Illness, unspecified: Secondary | ICD-10-CM | POA: Diagnosis not present

## 2018-08-30 MED ORDER — TIOTROPIUM BROMIDE-OLODATEROL 2.5-2.5 MCG/ACT IN AERS: 2.0000 | INHALATION_SPRAY | Freq: Every day | RESPIRATORY_TRACT | 0 refills | Status: DC

## 2018-08-30 NOTE — Progress Notes (Signed)
Synopsis: Referred in 08/2018 for COPD; she smoked several packs of cigarettes daily from [place] through about 2011, since 2011 has smoked less than 1 pack/day, was smoking 5 cigarettes a day as of August 18, 2018.  Subjective:   PATIENT ID: Christine Keller GENDER: female DOB: [birth year], MRN: [id number]   HPI  Chief Complaint  Patient presents with  . Follow-up    2wk f/u for COPD. Per patient and her husband, she has not been able to get Stiolto approved.     Declynn's breathing has improved a little bit since she started using the nebulizer at night.  However, she is frustrated because her insurance did not pay for Stiolto and she is waiting for the prior authorization process to go through.  She says that her cough is about the same.  She has not been sick with bronchitis or pneumonia since the last visit.  She is still smoking about 5 cigarettes a day.  Past Medical History:  Diagnosis Date  . Bilateral carpal tunnel syndrome   . COPD (chronic obstructive pulmonary disease) ([place]) 1.31.11   PFT FEV1 1.19(56%), FVC 2.68(90%), FEV1% 44, TLC 5.80(115%), DLCO 52%  . Hyperlipidemia   . Hypertension   . Osteoporosis    OSTEOPENIA  . Ovarian tumor 2014   Testosterone secreting ovarain tumor  . Thyroid nodule   . Tobacco abuse       Review of Systems  Constitutional: Negative for chills, fever, malaise/fatigue and weight loss.  HENT: Negative for congestion, nosebleeds, sinus pain and sore throat.   Eyes: Negative for photophobia, pain and discharge.  Respiratory: Positive for cough and shortness of breath. Negative for hemoptysis, sputum production and wheezing.   Cardiovascular: Negative for chest pain, palpitations, orthopnea and leg swelling.  Gastrointestinal: Positive for constipation. Negative for abdominal pain, diarrhea, nausea and vomiting.  Genitourinary: Negative for dysuria, frequency, hematuria and urgency.  Musculoskeletal: Negative for back pain, joint pain, myalgias  and neck pain.  Skin: Negative for itching and rash.  Neurological: Negative for tingling, tremors, sensory change, speech change, focal weakness, seizures, weakness and headaches.  Psychiatric/Behavioral: Negative for memory loss, substance abuse and suicidal ideas. The patient is not nervous/anxious.       Objective:  Physical Exam   Vitals:   08/30/18 1517  BP: (!) 148/78  Pulse: 74  SpO2: 92%  Weight: 122 lb (55.3 kg)  Height: 5\' 5"  (1.651 m)    RA  Gen: chronically ill appearing HENT: OP clear, TM's clear, neck supple PULM: CTA B, normal percussion CV: RRR, no mgr, trace edema GI: BS+, soft, nontender Derm: no cyanosis or rash Psyche: normal mood and affect    CBC    Component Value Date/Time   WBC 8.1 06/09/2018 1018   RBC 4.41 06/09/2018 1018   HGB 14.8 06/09/2018 1018   HCT 42.7 06/09/2018 1018   PLT 299.0 06/09/2018 1018   MCV 96.7 06/09/2018 1018   MCHC 34.8 06/09/2018 1018   RDW 12.6 06/09/2018 1018   LYMPHSABS 2.9 07/19/2015 0952   MONOABS 0.7 07/19/2015 0952   EOSABS 0.7 07/19/2015 0952   BASOSABS 0.0 07/19/2015 0952     Chest imaging: 08/2018 CXR reviewed emphysema, no infiltrate  PFT: 06/2013 Ratio 51% FEV1 1.3L (61% pred) 08/2018 Ratio 56%, FEV 1 1.0L (44% pred)  Labs:  Path:  Echo:  Heart Catheterization:      Assessment & Plan:   DYSPNEA - Plan: Spirometry with Graph  Chronic obstructive pulmonary disease, unspecified COPD type (  [place])  Cigarette smoker  Discussion: Spirometry testing today confirmed what I suspected: Christine Keller's COPD has worsened because of ongoing tobacco use.  This is the reason for her shortness of breath.  Fortunately there is nothing else seen on the lung function testing or chest x-ray aside from this.  Today I counseled her to quit smoking.  We spent a good 20 minutes today discussing the cost of inhaled medicines and how to navigate her insurance formulary with their preferred drugs, the prior  authorization process, and switching to nebulized meds if necessary.  They still would like to try Stiolto 2 puffs daily if insurance will pay for it.  Plan: Severe COPD: Worsening due to ongoing tobacco use Stop smoking We will apply for prior authorization for Stiolto Once you have received Stiolto take 2 puffs daily instead of Spiriva In the meantime take Spiriva 2 puffs daily and the DuoNeb as you are doing After receiving Stiolto you can take the DuoNeb every 4-6 hours as needed for chest tightness wheezing or shortness of breath Practice good hand hygiene Stay active  Tobacco abuse: Do your best to quit smoking  We will see you back in 2 to 3 months or sooner if needed  Greater than 50% of this 28-minute visit spent face-to-face     Current Outpatient Medications:  .  Albuterol Sulfate (PROAIR RESPICLICK) 096 (90 Base) MCG/ACT AEPB, Inhale 2 puffs into the lungs every 6 (six) hours as needed (wheezing)., Disp: 1 each, Rfl: 5 .  amLODipine (NORVASC) 10 MG tablet, TAKE ONE TABLET BY MOUTH DAILY, Disp: 90 tablet, Rfl: 2 .  CALCIUM-MAGNESIUM-ZINC PO, Take by mouth daily., Disp: , Rfl:  .  Cholecalciferol (VITAMIN D3) 2000 UNITS capsule, Take 2,000 Units by mouth daily., Disp: , Rfl:  .  Coenzyme Q10 (COQ10) 200 MG CAPS, Take 1 capsule by mouth daily.  , Disp: , Rfl:  .  Garlic Oil (ODORLESS GARLIC) 045 MG TABS, Take 1 tablet by mouth daily.  , Disp: , Rfl:  .  Green Tea 315 MG CAPS, Take 1 capsule by mouth daily.  , Disp: , Rfl:  .  ipratropium-albuterol (DUONEB) 0.5-2.5 (3) MG/3ML SOLN, Take 3 mLs by nebulization 3 (three) times daily as needed. Use when needed for shortness of breath exacerbation, Disp: 360 mL, Rfl: 1 .  levothyroxine (SYNTHROID, LEVOTHROID) 50 MCG tablet, Take 1 tablet (50 mcg total) by mouth daily., Disp: 30 tablet, Rfl: 6 .  Multiple Vitamin (MULTIVITAMIN) tablet, Take 1 tablet by mouth daily.  , Disp: , Rfl:  .  OMEGA 3 1000 MG CAPS, Take 1 capsule by mouth 2  (two) times daily.  , Disp: , Rfl:  .  predniSONE (DELTASONE) 20 MG tablet, Take 2 pills a day for 3 days, then 1 pill a day for 3 days, Disp: 9 tablet, Rfl: 0 .  Probiotic Product (PROBIOTIC DAILY PO), Take by mouth daily., Disp: , Rfl:  .  ranitidine (ZANTAC) 150 MG tablet, Take 150 mg by mouth 2 (two) times daily., Disp: , Rfl:  .  simvastatin (ZOCOR) 20 MG tablet, Take 0.5 tablets (10 mg total) by mouth daily., Disp: 90 tablet, Rfl: 3 .  Thiamine HCl (VITAMIN B-1) 100 MG tablet, Take 100 mg by mouth daily.  , Disp: , Rfl:  .  tiotropium (SPIRIVA HANDIHALER) 18 MCG inhalation capsule, Place 1 capsule (18 mcg total) into inhaler and inhale daily., Disp: 90 capsule, Rfl: 3 .  Tiotropium Bromide-Olodaterol (STIOLTO RESPIMAT) 2.5-2.5 MCG/ACT AERS, Inhale  2 puffs into the lungs daily., Disp: 1 Inhaler, Rfl: 5 .  valsartan-hydrochlorothiazide (DIOVAN-HCT) 160-25 MG tablet, TAKE ONE TABLET BY MOUTH DAILY, Disp: 90 tablet, Rfl: 2 .  Vitamin Mixture (ESTER-C) 500-60 MG TABS, Take 1 tablet by mouth daily.  , Disp: , Rfl:

## 2018-08-30 NOTE — Telephone Encounter (Signed)
Medication name and strength: Stiolto 2.26mcg Provider: BQ [facility]  Patient insurance ID: [id number] Phone:  Fax:   Was the PA started on CMM?  Yes If yes, please enter the Key: [id number] Timeframe for approval/denial: 24hrs  Re-submitted patient's PA since the first one was not submitted properly. Will check status in 24hrs. Patient is aware that this has been done today.

## 2018-08-30 NOTE — Patient Instructions (Signed)
Severe COPD: Worsening due to ongoing tobacco use Stop smoking We will apply for prior authorization for Stiolto Once you have received Stiolto take 2 puffs daily instead of Spiriva In the meantime take Spiriva 2 puffs daily and the DuoNeb as you are doing After receiving Stiolto you can take the DuoNeb every 4-6 hours as needed for chest tightness wheezing or shortness of breath Practice good hand hygiene Stay active  Tobacco abuse: Do your best to quit smoking  We will see you back in 2 to 3 months or sooner if needed

## 2018-08-31 NOTE — Telephone Encounter (Signed)
Called patient, unable to reach left message to give us a call back. 

## 2018-08-31 NOTE — Telephone Encounter (Signed)
OK to Rx Anoro one puff daily

## 2018-08-31 NOTE — Telephone Encounter (Signed)
PA for Stiolto has been denied. [name] has sent over information regarding the denial but I have not been able to locate the paperwork.   Spoke with patient's husband Christine Keller. He stated that BQ mentioned that if Stiolto would be denied, Anoro would be an option. Checked patient's formulary, [name] is covered by patient's insurance.   BQ, do you want to proceed with the appeal for Stiolto or switch her to Anoro? She received a 2 week sample of Stiolto yesterday. Thanks!

## 2018-09-02 NOTE — Telephone Encounter (Signed)
Appeal for Beau Fanny has been approved.

## 2018-09-09 ENCOUNTER — Telehealth: Payer: Self-pay | Admitting: Pulmonary Disease

## 2018-09-09 NOTE — Telephone Encounter (Signed)
Called and spoke with husband. He states the pharmacy called and said that it was going to be expensive. Husband is going to call pharmacy and see if it is a donut hole issue or do they need to switch to Anoro (as mentioned by Dr. Lake Bells per patients husband).  He will call us back if he wants to start paper work or receive some samples.   Nothing further needed at this time.

## 2018-10-11 ENCOUNTER — Encounter: Payer: Self-pay | Admitting: Family Medicine

## 2018-10-11 DIAGNOSIS — Z760 Encounter for issue of repeat prescription: Secondary | ICD-10-CM

## 2018-10-12 MED ORDER — TIOTROPIUM BROMIDE MONOHYDRATE 18 MCG IN CAPS: 18.0000 ug | ORAL_CAPSULE | Freq: Every day | RESPIRATORY_TRACT | 1 refills | Status: DC

## 2018-10-13 DIAGNOSIS — R69 Illness, unspecified: Secondary | ICD-10-CM | POA: Diagnosis not present

## 2018-10-29 ENCOUNTER — Other Ambulatory Visit: Payer: Self-pay | Admitting: Family Medicine

## 2018-10-29 DIAGNOSIS — Z760 Encounter for issue of repeat prescription: Secondary | ICD-10-CM

## 2018-10-29 DIAGNOSIS — E782 Mixed hyperlipidemia: Secondary | ICD-10-CM

## 2018-11-18 DIAGNOSIS — H25811 Combined forms of age-related cataract, right eye: Secondary | ICD-10-CM | POA: Diagnosis not present

## 2018-11-18 DIAGNOSIS — H353131 Nonexudative age-related macular degeneration, bilateral, early dry stage: Secondary | ICD-10-CM | POA: Diagnosis not present

## 2018-11-18 DIAGNOSIS — H2512 Age-related nuclear cataract, left eye: Secondary | ICD-10-CM | POA: Diagnosis not present

## 2018-11-18 DIAGNOSIS — Z01818 Encounter for other preprocedural examination: Secondary | ICD-10-CM | POA: Diagnosis not present

## 2018-11-18 DIAGNOSIS — H2511 Age-related nuclear cataract, right eye: Secondary | ICD-10-CM | POA: Diagnosis not present

## 2018-11-22 IMAGING — MG DIGITAL SCREENING BILATERAL MAMMOGRAM WITH TOMO AND CAD
6 of 10 series · 6 of 30 positions shown · non-contrast
Comparison: Previous exam(s).

CLINICAL DATA: Screening.

EXAM:
DIGITAL SCREENING BILATERAL MAMMOGRAM WITH TOMO AND CAD

[R MLO synth-2D (1 of 2)]
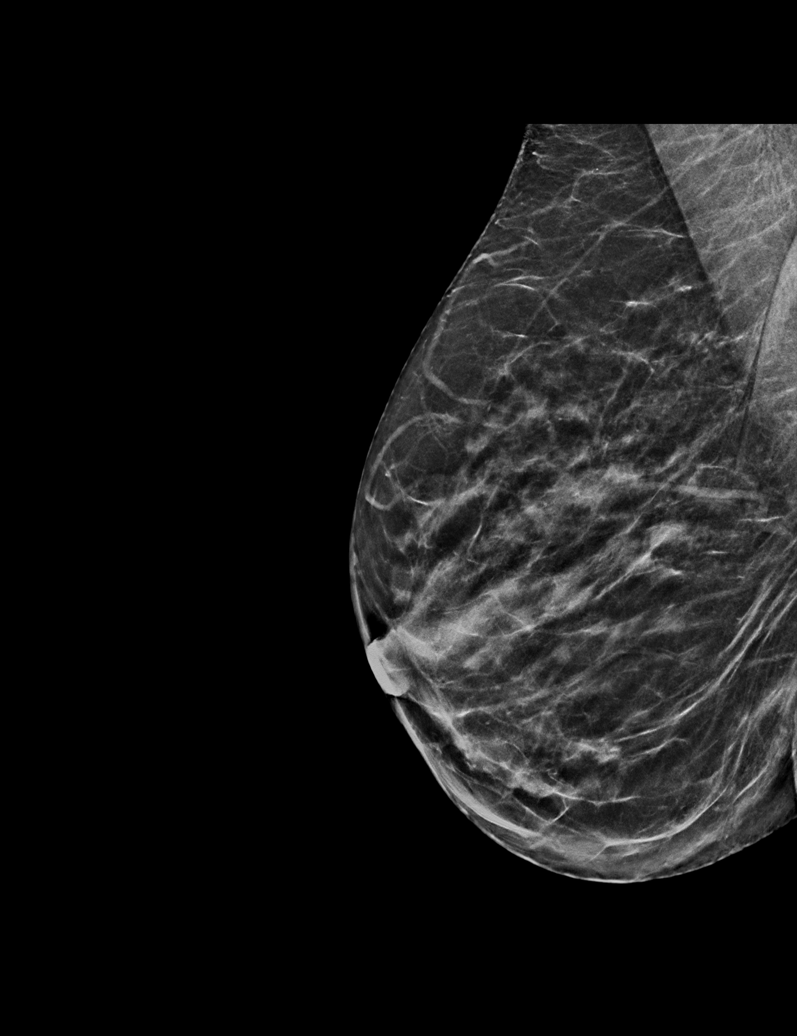

[L CC synth-2D]
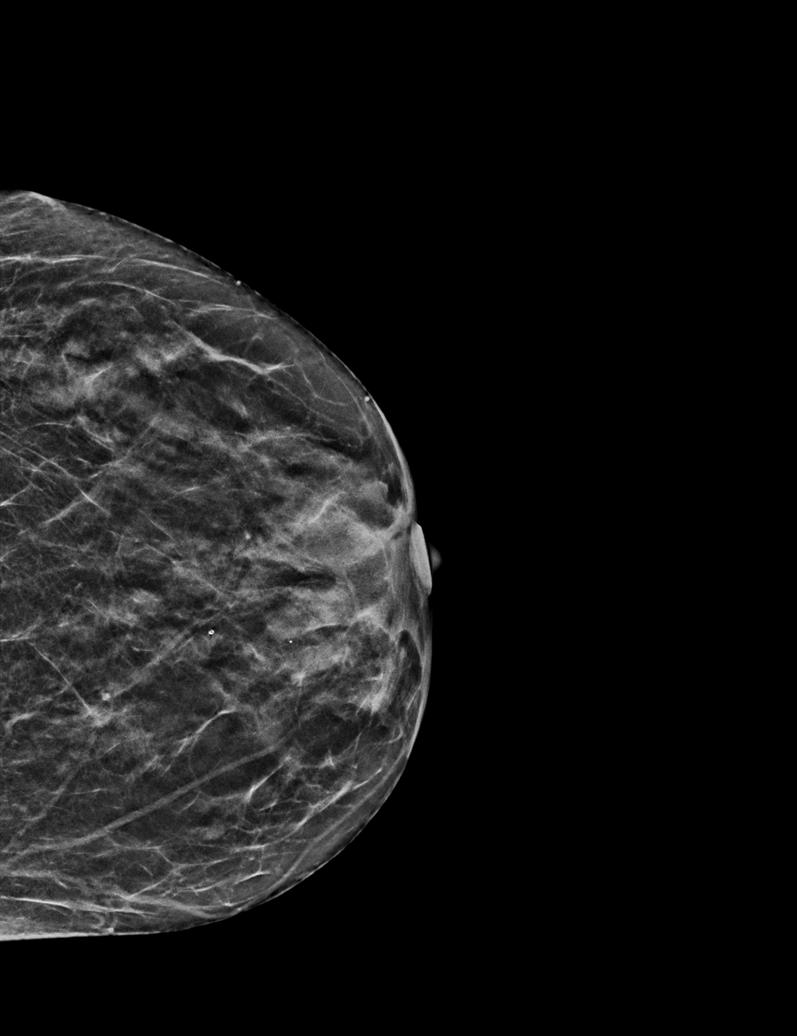

[L MLO synth-2D]
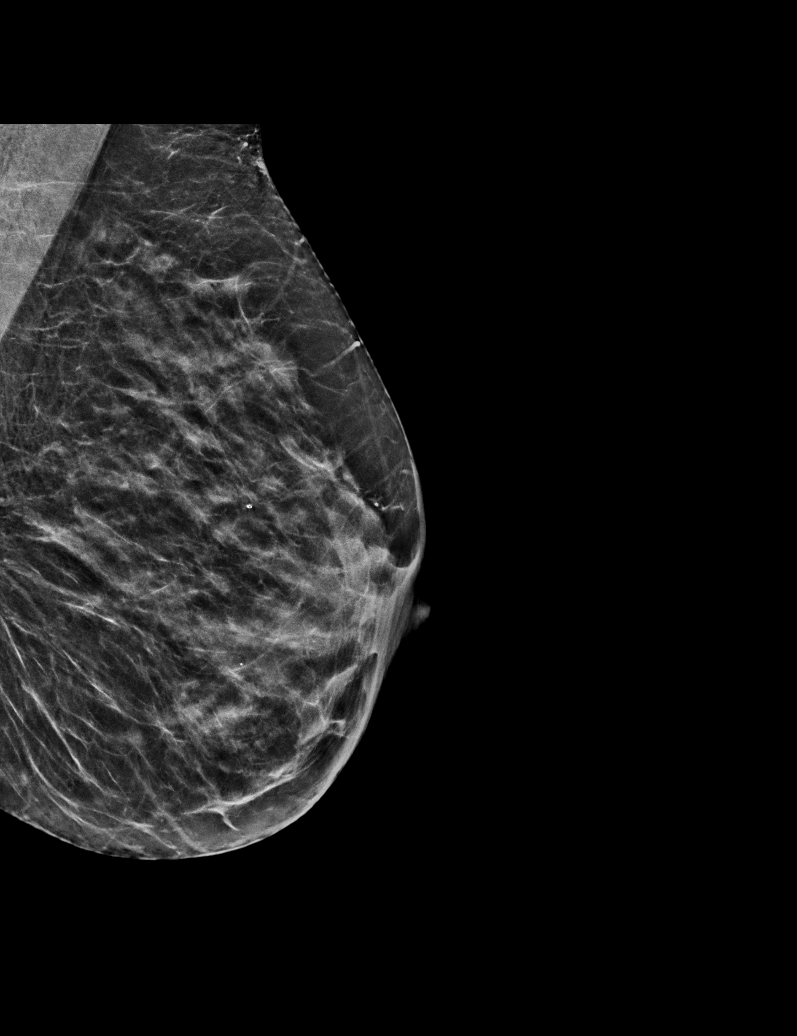

[R MLO synth-2D (2 of 2)]
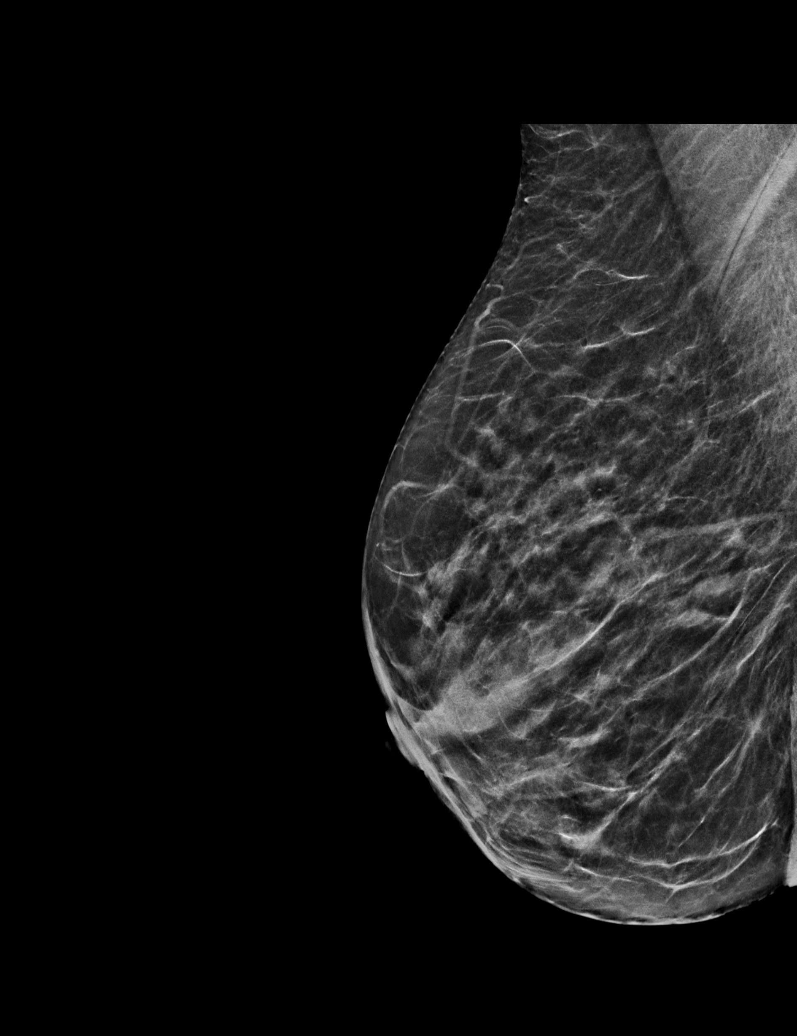

[R CC synth-2D]
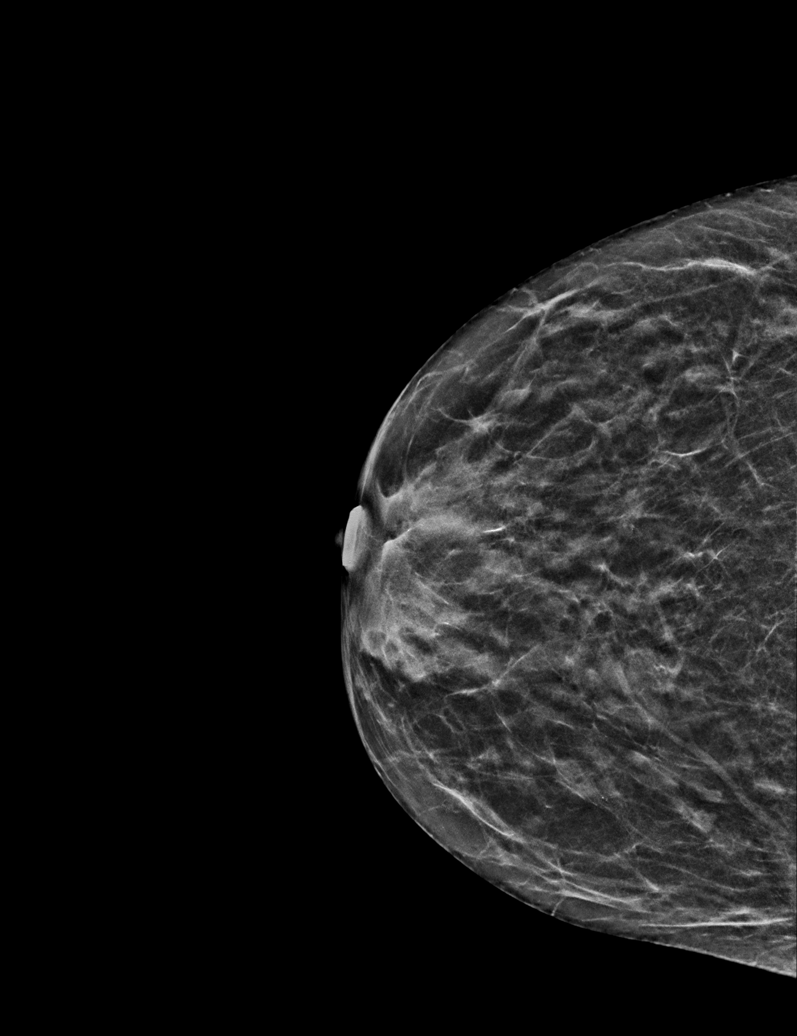

[R MLO tomo · tomo slice 25/49.0]
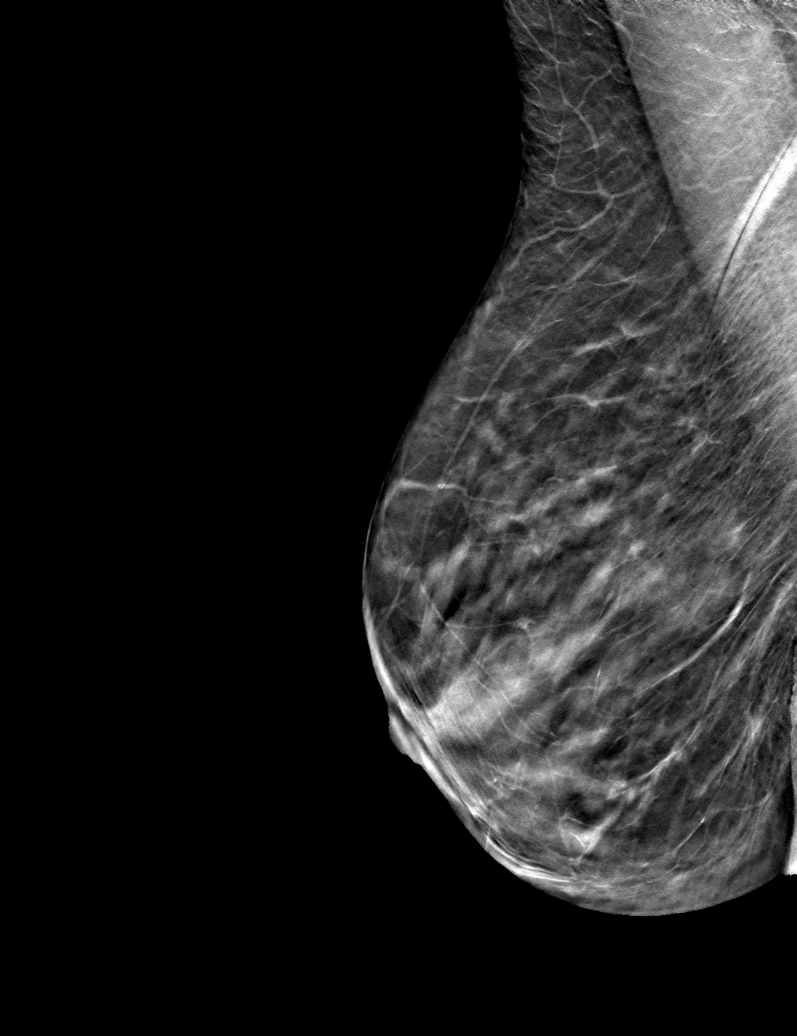

[6 of 30 positions shown; findings below may reference images not displayed]

ACR Breast Density Category c: The breast tissue is heterogeneously
dense, which may obscure small masses.
FINDINGS: There are no findings suspicious for malignancy. Images were
processed with CAD.
IMPRESSION: No mammographic evidence of malignancy. A result letter of this
screening mammogram will be mailed directly to the patient.

RECOMMENDATION:
Screening mammogram in one year. (Code:[id number])

BI-RADS CATEGORY  1: Negative.

## 2018-12-01 ENCOUNTER — Other Ambulatory Visit: Payer: Self-pay

## 2018-12-01 DIAGNOSIS — R69 Illness, unspecified: Secondary | ICD-10-CM | POA: Diagnosis not present

## 2018-12-01 DIAGNOSIS — J449 Chronic obstructive pulmonary disease, unspecified: Secondary | ICD-10-CM

## 2018-12-01 MED ORDER — IPRATROPIUM-ALBUTEROL 0.5-2.5 (3) MG/3ML IN SOLN: 3.0000 mL | Freq: Three times a day (TID) | RESPIRATORY_TRACT | 1 refills | Status: DC | PRN

## 2018-12-08 DIAGNOSIS — H25812 Combined forms of age-related cataract, left eye: Secondary | ICD-10-CM | POA: Diagnosis not present

## 2018-12-08 DIAGNOSIS — H2512 Age-related nuclear cataract, left eye: Secondary | ICD-10-CM | POA: Diagnosis not present

## 2018-12-16 ENCOUNTER — Other Ambulatory Visit: Payer: Self-pay | Admitting: Family Medicine

## 2018-12-16 DIAGNOSIS — H5203 Hypermetropia, bilateral: Secondary | ICD-10-CM | POA: Diagnosis not present

## 2018-12-16 DIAGNOSIS — H52209 Unspecified astigmatism, unspecified eye: Secondary | ICD-10-CM | POA: Diagnosis not present

## 2018-12-16 DIAGNOSIS — E034 Atrophy of thyroid (acquired): Secondary | ICD-10-CM

## 2018-12-16 DIAGNOSIS — H524 Presbyopia: Secondary | ICD-10-CM | POA: Diagnosis not present

## 2018-12-16 MED ORDER — LEVOTHYROXINE SODIUM 50 MCG PO TABS: 50.0000 ug | ORAL_TABLET | Freq: Every day | ORAL | 3 refills | Status: DC

## 2019-05-01 ENCOUNTER — Other Ambulatory Visit: Payer: Self-pay | Admitting: Family Medicine

## 2019-05-01 DIAGNOSIS — Z760 Encounter for issue of repeat prescription: Secondary | ICD-10-CM

## 2019-05-01 DIAGNOSIS — I10 Essential (primary) hypertension: Secondary | ICD-10-CM

## 2019-05-11 ENCOUNTER — Encounter: Payer: Self-pay | Admitting: Family Medicine

## 2019-05-29 ENCOUNTER — Encounter: Payer: Self-pay | Admitting: Family Medicine

## 2019-06-07 ENCOUNTER — Encounter: Payer: Self-pay | Admitting: Family Medicine

## 2019-06-08 DIAGNOSIS — R69 Illness, unspecified: Secondary | ICD-10-CM | POA: Diagnosis not present

## 2019-07-05 ENCOUNTER — Encounter: Payer: Self-pay | Admitting: Family Medicine

## 2019-07-27 DIAGNOSIS — R69 Illness, unspecified: Secondary | ICD-10-CM | POA: Diagnosis not present

## 2019-07-30 ENCOUNTER — Other Ambulatory Visit: Payer: Self-pay | Admitting: Family Medicine

## 2019-07-30 DIAGNOSIS — E782 Mixed hyperlipidemia: Secondary | ICD-10-CM

## 2019-07-30 DIAGNOSIS — I10 Essential (primary) hypertension: Secondary | ICD-10-CM

## 2019-07-30 DIAGNOSIS — Z760 Encounter for issue of repeat prescription: Secondary | ICD-10-CM

## 2019-09-01 ENCOUNTER — Other Ambulatory Visit: Payer: Self-pay | Admitting: Family Medicine

## 2019-09-01 DIAGNOSIS — E782 Mixed hyperlipidemia: Secondary | ICD-10-CM

## 2019-09-01 DIAGNOSIS — I10 Essential (primary) hypertension: Secondary | ICD-10-CM

## 2019-09-01 DIAGNOSIS — Z760 Encounter for issue of repeat prescription: Secondary | ICD-10-CM

## 2019-09-05 NOTE — Progress Notes (Signed)
[facility] [address], [place], [place] [address] [phone]  Date:  09/06/2019   Name:  Christine Keller   DOB:  Jul 29, 1941   MRN:  [id number]  PCP:  [name], MD    Chief Complaint: No chief complaint on file.   History of Present Illness:  Christine Keller is a 78 y.o. very pleasant female patient who presents with the following:  Virtual visit today to discuss medications Pt location is home, provider is at office Pt identify confirmed with 2 factors, she gives consent for virtual visit today Pt and her husband are on the phone today-her husband contributes to the history Their son is taking care of groceries for them so they don't have to go out They miss their granddaughter, they have not seen her during the pandemic-otherwise they have been doing okay, have not been ill  Last seen by myself about 1 year ago for a COPD exacerbation History of COPD, tobacco abuse, prediabetes, hypothyroidism, hyperlipidemia, hypertension  BP Readings from Last 3 Encounters:  08/30/18 (!) 148/78  08/18/18 124/70  08/01/18 140/82   Most recent labs are over a year old Flu shot is up-to-date Colon cancer screen is due- discussed with her, she is delaying due to pandemic  Offered Cologuard in the interim, she declines  Amlodipine 10 Levothyroxine 50 Simvastatin 20 Valsartan/HCTZ Stiolto respimat Spiriva DuoNeb Albuterol  Went over her meds today- she needs refills of her BP and lipid medication, thyroid med They are checking her oxygen level at home- 96-97% BP is running "ok for my age" She is generally feeling well  Lab Results  Component Value Date   TSH 2.08 07/19/2018   Lab Results  Component Value Date   HGBA1C 6.0 06/09/2018    Patient Active Problem List   Diagnosis Date Noted  . Routine general medical examination at a health care facility 08/14/2015  . Hypothyroidism 12/05/2014  .  Hyperlipidemia 08/08/2014  . Osteopenia 12/27/2013  . GERD (gastroesophageal reflux disease) 12/27/2013  . Guaiac positive stools 12/27/2013  . Ovarian tumor 05/16/2012  . Borderline diabetes 04/16/2011  . Preventative health care 04/15/2011  . Hyperglycemia 04/15/2011  . Essential hypertension 12/22/2010  . Carpal tunnel syndrome 12/22/2010  . COPD (chronic obstructive pulmonary disease) ([place]) 11/15/2009  . POLYCYTHEMIA 10/30/2009  . TOBACCO ABUSE 10/30/2009  . DYSPNEA 10/30/2009    Past Medical History:  Diagnosis Date  . Bilateral carpal tunnel syndrome   . COPD (chronic obstructive pulmonary disease) ([place]) 1.31.11   PFT FEV1 1.19(56%), FVC 2.68(90%), FEV1% 44, TLC 5.80(115%), DLCO 52%  . Hyperlipidemia   . Hypertension   . Osteoporosis    OSTEOPENIA  . Ovarian tumor 2014   Testosterone secreting ovarain tumor  . Thyroid nodule   . Tobacco abuse     Past Surgical History:  Procedure Laterality Date  . CARPAL TUNNEL RELEASE  05/26/2012   Procedure: CARPAL TUNNEL RELEASE;  Surgeon: Cammie Sickle., MD;  Location: [facility];  Service: Orthopedics;  Laterality: Left;  Injection of Right thumb Carpal-Metacarpal joint also  . CARPAL TUNNEL RELEASE  08/16/2012   Procedure: CARPAL TUNNEL RELEASE;  Surgeon: Cammie Sickle., MD;  Location: [facility];  Service: Orthopedics;  Laterality: Right;  Inject Left Thumb Carpal Metacarpal Joint  . OTHER SURGICAL HISTORY  12/14/12   bilateral oopherectomy [facility]  . STERIOD INJECTION  08/16/2012   Procedure: STEROID INJECTION;  Surgeon: Cammie Sickle., MD;  Location: [facility];  Service: Orthopedics;  Laterality: Left;  Inject Left Thumb Carpal Metacarpal Joint  . TONSILLECTOMY    . ULNAR NERVE TRANSPOSITION  05/26/2012   Procedure: ULNAR NERVE DECOMPRESSION/TRANSPOSITION;  Surgeon: Cammie Sickle., MD;  Location: [facility];  Service: Orthopedics;  Laterality: Left;     Social History   Tobacco Use  . Smoking status: Current Every Day Smoker    Packs/day: 1.00    Years: 60.00    Pack years: 60.00    Types: Cigarettes  . Smokeless tobacco: Never Used  . Tobacco comment: Less than 1 ppd.  Substance Use Topics  . Alcohol use: Yes    Alcohol/week: 7.0 standard drinks    Types: 7 Glasses of wine per week  . Drug use: No    Family History  Problem Relation Age of Onset  . Heart disease Mother   . Hypertension Mother   . Asthma Father   . Emphysema Father   . Heart disease Brother     Allergies  Allergen Reactions  . Prednisone Shortness Of Breath    Per pt. Report 08/03/18  . Codeine     REACTION: hallucinations  . Niacin     REACTION: rash    Medication list has been reviewed and updated.  Current Outpatient Medications on File Prior to Visit  Medication Sig Dispense Refill  . Albuterol Sulfate (PROAIR RESPICLICK) 123XX123 (90 Base) MCG/ACT AEPB Inhale 2 puffs into the lungs every 6 (six) hours as needed (wheezing). 1 each 5  . amLODipine (NORVASC) 10 MG tablet TAKE ONE TABLET BY MOUTH DAILY 30 tablet 0  . CALCIUM-MAGNESIUM-ZINC PO Take by mouth daily.    . Cholecalciferol (VITAMIN D3) 2000 UNITS capsule Take 2,000 Units by mouth daily.    . Coenzyme Q10 (COQ10) 200 MG CAPS Take 1 capsule by mouth daily.      . Garlic Oil (ODORLESS GARLIC) XX123456 MG TABS Take 1 tablet by mouth daily.      Nyoka Cowden Tea 315 MG CAPS Take 1 capsule by mouth daily.      Marland Kitchen ipratropium-albuterol (DUONEB) 0.5-2.5 (3) MG/3ML SOLN Take 3 mLs by nebulization 3 (three) times daily as needed. Use when needed for shortness of breath exacerbation 360 mL 1  . levothyroxine (SYNTHROID, LEVOTHROID) 50 MCG tablet Take 1 tablet (50 mcg total) by mouth daily. 90 tablet 3  . Multiple Vitamin (MULTIVITAMIN) tablet Take 1 tablet by mouth daily.      . OMEGA 3 1000 MG CAPS Take 1 capsule by mouth 2 (two) times daily.      . Probiotic Product (PROBIOTIC DAILY PO) Take by mouth  daily.    . ranitidine (ZANTAC) 150 MG tablet Take 150 mg by mouth 2 (two) times daily.    . simvastatin (ZOCOR) 20 MG tablet TAKE ONE-HALF TABLET BY MOUTH DAILY 15 tablet 0  . Thiamine HCl (VITAMIN B-1) 100 MG tablet Take 100 mg by mouth daily.      Marland Kitchen tiotropium (SPIRIVA HANDIHALER) 18 MCG inhalation capsule Place 1 capsule (18 mcg total) into inhaler and inhale daily. 180 capsule 1  . Tiotropium Bromide-Olodaterol (STIOLTO RESPIMAT) 2.5-2.5 MCG/ACT AERS Inhale 2 puffs into the lungs daily. 1 Inhaler 0  . valsartan-hydrochlorothiazide (DIOVAN-HCT) 160-25 MG tablet TAKE ONE TABLET BY MOUTH DAILY 30 tablet 0  . Vitamin Mixture (ESTER-C) 500-60 MG TABS Take 1 tablet by mouth daily.  No current facility-administered medications on file prior to visit.     Review of Systems:  As per HPI- otherwise negative. No fever or chills, no recent COPD exacerbation symptoms  Physical Examination: There were no vitals filed for this visit. There were no vitals filed for this visit. There is no height or weight on file to calculate BMI. Ideal Body Weight:    Spoke with patient on the telephone.  She sounds well, no cough, wheezing, shortness of breath is noted  Assessment and Plan: Hypothyroidism due to acquired atrophy of thyroid - Plan: levothyroxine (SYNTHROID) 50 MCG tablet, TSH  Chronic obstructive pulmonary disease, unspecified COPD type (HCC)  Mixed hyperlipidemia - Plan: simvastatin (ZOCOR) 20 MG tablet, Lipid panel  Pre-diabetes - Plan: Hemoglobin A1c  Essential hypertension - Plan: amLODipine (NORVASC) 10 MG tablet, valsartan-hydrochlorothiazide (DIOVAN-HCT) 160-25 MG tablet, CBC, Comprehensive metabolic panel  POLYCYTHEMIA - Plan: CBC  Pulmonary emphysema, unspecified emphysema type (HCC)  Medication refill - Plan: amLODipine (NORVASC) 10 MG tablet, simvastatin (ZOCOR) 20 MG tablet, valsartan-hydrochlorothiazide (DIOVAN-HCT) 160-25 MG tablet  Virtual follow-up visit today for  medication management Patient is checking her blood pressure at home, she does not recall the exact numbers but feels it is under good control Her COPD symptoms are stable, she has her inhalers on hand Refilled blood pressure, thyroid, lipid medication as above Reminded about colon cancer screening when she is able Ordered routine labs for her to do at her convenience Right now the patient and her husband are being very cautious due to COVID-19 and are not leaving home unless absolutely necessary  Spoke with patient for 8-1/2 minutes today  Signed Lamar Blinks, MD

## 2019-09-06 ENCOUNTER — Ambulatory Visit (INDEPENDENT_AMBULATORY_CARE_PROVIDER_SITE_OTHER): Payer: Medicare HMO | Admitting: Family Medicine

## 2019-09-06 ENCOUNTER — Other Ambulatory Visit: Payer: Self-pay

## 2019-09-06 ENCOUNTER — Encounter: Payer: Self-pay | Admitting: Family Medicine

## 2019-09-06 DIAGNOSIS — J449 Chronic obstructive pulmonary disease, unspecified: Secondary | ICD-10-CM

## 2019-09-06 DIAGNOSIS — Z760 Encounter for issue of repeat prescription: Secondary | ICD-10-CM | POA: Diagnosis not present

## 2019-09-06 DIAGNOSIS — E782 Mixed hyperlipidemia: Secondary | ICD-10-CM

## 2019-09-06 DIAGNOSIS — D751 Secondary polycythemia: Secondary | ICD-10-CM

## 2019-09-06 DIAGNOSIS — E034 Atrophy of thyroid (acquired): Secondary | ICD-10-CM

## 2019-09-06 DIAGNOSIS — I10 Essential (primary) hypertension: Secondary | ICD-10-CM

## 2019-09-06 DIAGNOSIS — R7303 Prediabetes: Secondary | ICD-10-CM

## 2019-09-06 DIAGNOSIS — J439 Emphysema, unspecified: Secondary | ICD-10-CM | POA: Diagnosis not present

## 2019-09-06 MED ORDER — SIMVASTATIN 20 MG PO TABS: 10.0000 mg | ORAL_TABLET | Freq: Every day | ORAL | 3 refills | Status: DC

## 2019-09-06 MED ORDER — LEVOTHYROXINE SODIUM 50 MCG PO TABS: 50.0000 ug | ORAL_TABLET | Freq: Every day | ORAL | 3 refills | Status: DC

## 2019-09-06 MED ORDER — AMLODIPINE BESYLATE 10 MG PO TABS: 10.0000 mg | ORAL_TABLET | Freq: Every day | ORAL | 3 refills | Status: DC

## 2019-09-06 MED ORDER — VALSARTAN-HYDROCHLOROTHIAZIDE 160-25 MG PO TABS: 1.0000 | ORAL_TABLET | Freq: Every day | ORAL | 3 refills | Status: DC

## 2019-10-25 ENCOUNTER — Other Ambulatory Visit (INDEPENDENT_AMBULATORY_CARE_PROVIDER_SITE_OTHER): Payer: Medicare HMO

## 2019-10-25 ENCOUNTER — Other Ambulatory Visit: Payer: Self-pay

## 2019-10-25 DIAGNOSIS — R7303 Prediabetes: Secondary | ICD-10-CM

## 2019-10-25 DIAGNOSIS — E034 Atrophy of thyroid (acquired): Secondary | ICD-10-CM

## 2019-10-25 DIAGNOSIS — I10 Essential (primary) hypertension: Secondary | ICD-10-CM | POA: Diagnosis not present

## 2019-10-25 DIAGNOSIS — E782 Mixed hyperlipidemia: Secondary | ICD-10-CM

## 2019-10-25 DIAGNOSIS — D751 Secondary polycythemia: Secondary | ICD-10-CM

## 2019-10-26 ENCOUNTER — Encounter: Payer: Self-pay | Admitting: Family Medicine

## 2019-10-26 LAB — CBC
HCT: 41.3 % (ref 36.0–46.0)
Hemoglobin: 14.3 g/dL (ref 12.0–15.0)
MCHC: 34.6 g/dL (ref 30.0–36.0)
MCV: 97.6 fl (ref 78.0–100.0)
Platelets: 359 10*3/uL (ref 150.0–400.0)
RBC: 4.23 Mil/uL (ref 3.87–5.11)
RDW: 12.6 % (ref 11.5–15.5)
WBC: 8.9 10*3/uL (ref 4.0–10.5)

## 2019-10-26 LAB — TSH: TSH: 0.84 u[IU]/mL (ref 0.35–4.50)

## 2019-10-26 LAB — COMPREHENSIVE METABOLIC PANEL
ALT: 17 U/L (ref 0–35)
AST: 22 U/L (ref 0–37)
Albumin: 4.5 g/dL (ref 3.5–5.2)
Alkaline Phosphatase: 80 U/L (ref 39–117)
BUN: 11 mg/dL (ref 6–23)
CO2: 30 mEq/L (ref 19–32)
Calcium: 10.3 mg/dL (ref 8.4–10.5)
Chloride: 103 mEq/L (ref 96–112)
Creatinine, Ser: 0.84 mg/dL (ref 0.40–1.20)
GFR: 65.32 mL/min (ref >60.00)
Glucose, Bld: 108 mg/dL — ABNORMAL HIGH (ref 70–99)
Potassium: 3.6 mEq/L (ref 3.5–5.1)
Sodium: 141 mEq/L (ref 135–145)
Total Bilirubin: 0.5 mg/dL (ref 0.2–1.2)
Total Protein: 7.2 g/dL (ref 6.0–8.3)

## 2019-10-26 LAB — LIPID PANEL
Cholesterol: 160 mg/dL (ref 0–200)
HDL: 72.7 mg/dL (ref >39.00)
LDL Cholesterol: 60 mg/dL (ref 0–99)
NonHDL: 87.11
Total CHOL/HDL Ratio: 2
Triglycerides: 136 mg/dL (ref 0.0–149.0)
VLDL: 27.2 mg/dL (ref 0.0–40.0)

## 2019-10-26 LAB — HEMOGLOBIN A1C: Hgb A1c MFr Bld: 5.9 % (ref 4.6–6.5)

## 2019-11-21 ENCOUNTER — Other Ambulatory Visit: Payer: Self-pay

## 2019-11-21 NOTE — Progress Notes (Signed)
[facility] [place], [place], [place] [address] [phone]  Date:  11/22/2019   Name:  Christine Keller   DOB:  07-15-1941   MRN:  [id number]  PCP:  [name], MD    Chief Complaint: Hypertension (pt stopped taking amlodipine. )   History of Present Illness:  Christine Keller is a 79 y.o. very pleasant female patient who presents with the following:  Here today for a routine follow-up visit Last seen by myself in December for a virtual visit History of COPD, tobacco abuse, prediabetes, hypothyroidism, hyperlipidemia, hypertension  Routine labs done last month- lipids favorable, A1c 5.9% Recently she has noticed her blood pressure running a bit low, wonders about possibly changing her medication They had noted diastolic numbers down to the mid 50s.  Decided to decrease and then stop her amlodipine  Lab Results  Component Value Date   TSH 0.84 10/25/2019   Amlodipine 10-day decreased by 50%, and then stopped totally due to low blood pressure Levothyroxine 50 Simvastatin 20 Valsartan/HCTZ Stiolto respimat Spiriva DuoNeb Albuterol Simvastatin 10 mg daily  Colon cancer screening now due Can offer lung cancer screening CT scan- she delicns to have this done   She is feeling well-no particular concerns today Stopped taking amlodipine 2 or 3 days ago She really was not feeling lightheaded even when her blood pressure was quite low but they just noted her systolic BP numbers running low, to about 55  BP Readings from Last 3 Encounters:  11/22/19 122/62  08/30/18 (!) 148/78  08/18/18 124/70     Patient Active Problem List   Diagnosis Date Noted  . Routine general medical examination at a health care facility 08/14/2015  . Hypothyroidism 12/05/2014  . Hyperlipidemia 08/08/2014  . Osteopenia 12/27/2013  . GERD (gastroesophageal reflux disease) 12/27/2013  . Guaiac positive stools 12/27/2013  .  Ovarian tumor 05/16/2012  . Borderline diabetes 04/16/2011  . Preventative health care 04/15/2011  . Hyperglycemia 04/15/2011  . Essential hypertension 12/22/2010  . Carpal tunnel syndrome 12/22/2010  . COPD (chronic obstructive pulmonary disease) ([place]) 11/15/2009  . POLYCYTHEMIA 10/30/2009  . TOBACCO ABUSE 10/30/2009  . DYSPNEA 10/30/2009    Past Medical History:  Diagnosis Date  . Bilateral carpal tunnel syndrome   . COPD (chronic obstructive pulmonary disease) ([place]) 1.31.11   PFT FEV1 1.19(56%), FVC 2.68(90%), FEV1% 44, TLC 5.80(115%), DLCO 52%  . Hyperlipidemia   . Hypertension   . Osteoporosis    OSTEOPENIA  . Ovarian tumor 2014   Testosterone secreting ovarain tumor  . Thyroid nodule   . Tobacco abuse     Past Surgical History:  Procedure Laterality Date  . CARPAL TUNNEL RELEASE  05/26/2012   Procedure: CARPAL TUNNEL RELEASE;  Surgeon: Cammie Sickle., MD;  Location: [facility];  Service: Orthopedics;  Laterality: Left;  Injection of Right thumb Carpal-Metacarpal joint also  . CARPAL TUNNEL RELEASE  08/16/2012   Procedure: CARPAL TUNNEL RELEASE;  Surgeon: Cammie Sickle., MD;  Location: [facility];  Service: Orthopedics;  Laterality: Right;  Inject Left Thumb Carpal Metacarpal Joint  . OTHER SURGICAL HISTORY  12/14/12   bilateral oopherectomy [facility]  . STERIOD INJECTION  08/16/2012   Procedure: STEROID INJECTION;  Surgeon: Cammie Sickle., MD;  Location: [facility];  Service: Orthopedics;  Laterality: Left;  Inject Left Thumb Carpal Metacarpal Joint  . TONSILLECTOMY    .  ULNAR NERVE TRANSPOSITION  05/26/2012   Procedure: ULNAR NERVE DECOMPRESSION/TRANSPOSITION;  Surgeon: Cammie Sickle., MD;  Location: [facility];  Service: Orthopedics;  Laterality: Left;    Social History   Tobacco Use  . Smoking status: Current Every Day Smoker    Packs/day: 1.00    Years: 60.00    Pack years: 60.00     Types: Cigarettes  . Smokeless tobacco: Never Used  . Tobacco comment: Less than 1 ppd.  Substance Use Topics  . Alcohol use: Yes    Alcohol/week: 7.0 standard drinks    Types: 7 Glasses of wine per week  . Drug use: No    Family History  Problem Relation Age of Onset  . Heart disease Mother   . Hypertension Mother   . Asthma Father   . Emphysema Father   . Heart disease Brother     Allergies  Allergen Reactions  . Prednisone Shortness Of Breath    Per pt. Report 08/03/18  . Codeine     REACTION: hallucinations  . Niacin     REACTION: rash    Medication list has been reviewed and updated.  Current Outpatient Medications on File Prior to Visit  Medication Sig Dispense Refill  . Albuterol Sulfate (PROAIR RESPICLICK) 123XX123 (90 Base) MCG/ACT AEPB Inhale 2 puffs into the lungs every 6 (six) hours as needed (wheezing). 1 each 5  . CALCIUM-MAGNESIUM-ZINC PO Take by mouth daily.    . Cholecalciferol (VITAMIN D3) 2000 UNITS capsule Take 2,000 Units by mouth daily.    . Coenzyme Q10 (COQ10) 200 MG CAPS Take 1 capsule by mouth daily.      . Garlic Oil (ODORLESS GARLIC) [id number] MG TABS Take 1 tablet by mouth daily.      Nyoka Cowden Tea 315 MG CAPS Take 1 capsule by mouth daily.      Marland Kitchen ipratropium-albuterol (DUONEB) 0.5-2.5 (3) MG/3ML SOLN Take 3 mLs by nebulization 3 (three) times daily as needed. Use when needed for shortness of breath exacerbation 360 mL 1  . levothyroxine (SYNTHROID) 50 MCG tablet Take 1 tablet (50 mcg total) by mouth daily. 90 tablet 3  . Multiple Vitamin (MULTIVITAMIN) tablet Take 1 tablet by mouth daily.      . OMEGA 3 1000 MG CAPS Take 1 capsule by mouth 2 (two) times daily.      . Probiotic Product (PROBIOTIC DAILY PO) Take by mouth daily.    . ranitidine (ZANTAC) 150 MG tablet Take 150 mg by mouth 2 (two) times daily.    . simvastatin (ZOCOR) 20 MG tablet Take 0.5 tablets (10 mg total) by mouth daily. 45 tablet 3  . Thiamine HCl (VITAMIN B-1) 100 MG tablet Take 100  mg by mouth daily.      Marland Kitchen tiotropium (SPIRIVA HANDIHALER) 18 MCG inhalation capsule Place 1 capsule (18 mcg total) into inhaler and inhale daily. 180 capsule 1  . Tiotropium Bromide-Olodaterol (STIOLTO RESPIMAT) 2.5-2.5 MCG/ACT AERS Inhale 2 puffs into the lungs daily. 1 Inhaler 0  . valsartan-hydrochlorothiazide (DIOVAN-HCT) 160-25 MG tablet Take 1 tablet by mouth daily. 90 tablet 3  . Vitamin Mixture (ESTER-C) 500-60 MG TABS Take 1 tablet by mouth daily.       No current facility-administered medications on file prior to visit.    Review of Systems:  As per HPI- otherwise negative.   Physical Examination: Vitals:   11/22/19 1355 11/22/19 1424  BP: (!) 142/60 122/62  Pulse: 92   Resp: 18  Temp: (!) 97 F (36.1 C)   SpO2: 99%    Vitals:   11/22/19 1355  Weight: 113 lb 12.8 oz (51.6 kg)  Height: 5\' 5"  (1.651 m)   Body mass index is 18.94 kg/m. Ideal Body Weight: Weight in (lb) to have BMI = 25: 149.9  GEN: no acute distress.  Looks well, slim build HEENT: Atraumatic, Normocephalic.  Ears and Nose: No external deformity. CV: RRR, No M/G/R. No JVD. No thrill. No extra heart sounds. PULM: CTA B, no wheezes, crackles, rhonchi. No retractions. No resp. distress. No accessory muscle use. ABD: S, NT, ND, +BS. No rebound. No HSM. EXTR: No c/c/e PSYCH: Normally interactive. Conversant.   Wt Readings from Last 3 Encounters:  11/22/19 113 lb 12.8 oz (51.6 kg)  08/30/18 122 lb (55.3 kg)  08/18/18 121 lb (54.9 kg)     Assessment and Plan: Hypothyroidism due to acquired atrophy of thyroid  Mixed hyperlipidemia  Pre-diabetes  Essential hypertension  TOBACCO ABUSE  Following up today on blood pressure Anasofia stopped taking her amlodipine, her blood pressure looks fine without this medication.  Removed from her medication list Went over recent labs today- Recent A1c in prediabetes range, okay Recent lipids stable-continue Zocor Recent thyroid okay-continue current dose  of levothyroxine  She declines a lung cancer screening CT Reminded that colon cancer screen is due via MyChart Moderate medical decision making today  This visit occurred during the SARS-CoV-2 public health emergency.  Safety protocols were in place, including screening questions prior to the visit, additional usage of staff PPE, and extensive cleaning of exam room while observing appropriate contact time as indicated for disinfecting solutions.    Signed Lamar Blinks, MD

## 2019-11-21 NOTE — Patient Instructions (Addendum)
It was good to see you today- take care Please consider getting the shingles vaccine at your convenience  Continue to not take amlodipine- your BP looks ok on current medications  [facility] labs looked good

## 2019-11-22 ENCOUNTER — Encounter: Payer: Self-pay | Admitting: Family Medicine

## 2019-11-22 ENCOUNTER — Ambulatory Visit (INDEPENDENT_AMBULATORY_CARE_PROVIDER_SITE_OTHER): Payer: Medicare HMO | Admitting: Family Medicine

## 2019-11-22 VITALS — BP 122/62 | HR 92 | Temp 97.0°F | Resp 18 | Ht 65.0 in | Wt 113.8 lb

## 2019-11-22 DIAGNOSIS — R69 Illness, unspecified: Secondary | ICD-10-CM | POA: Diagnosis not present

## 2019-11-22 DIAGNOSIS — E782 Mixed hyperlipidemia: Secondary | ICD-10-CM

## 2019-11-22 DIAGNOSIS — R7303 Prediabetes: Secondary | ICD-10-CM

## 2019-11-22 DIAGNOSIS — I10 Essential (primary) hypertension: Secondary | ICD-10-CM

## 2019-11-22 DIAGNOSIS — E034 Atrophy of thyroid (acquired): Secondary | ICD-10-CM | POA: Diagnosis not present

## 2019-11-22 DIAGNOSIS — F17200 Nicotine dependence, unspecified, uncomplicated: Secondary | ICD-10-CM

## 2019-12-05 ENCOUNTER — Telehealth: Payer: Self-pay | Admitting: Family Medicine

## 2019-12-05 NOTE — Chronic Care Management (AMB) (Signed)
  Chronic Care Management   Note  12/05/2019 Name: Christine Keller MRN: [id number] DOB: December 08, 1940  Christine Keller is a 79 y.o. year old female who is a primary care patient of Copland, Gay Filler, MD. I reached out to Christine Keller by phone today in response to a referral sent by Ms. Christine Keller's PCP, Copland, Gay Filler, MD. Christine Hodgkins husband, Christine Keller gave verbal consent for CCM.  Christine Keller was given information about Chronic Care Management services today including:  1. CCM service includes personalized support from designated clinical staff supervised by her physician, including individualized plan of care and coordination with other care providers 2. 24/7 contact phone numbers for assistance for urgent and routine care needs. 3. Service will only be billed when office clinical staff spend 20 minutes or more in a month to coordinate care. 4. Only one practitioner may furnish and bill the service in a calendar month. 5. The patient may stop CCM services at any time (effective at the end of the month) by phone call to the office staff. 6. The patient will be responsible for cost sharing (co-pay) of up to 20% of the service fee (after annual deductible is met).  Patient agreed to services and verbal consent obtained.   Follow up plan:   Christine Keller [employer] Scheduler

## 2019-12-07 DIAGNOSIS — Z961 Presence of intraocular lens: Secondary | ICD-10-CM | POA: Diagnosis not present

## 2019-12-07 DIAGNOSIS — H04123 Dry eye syndrome of bilateral lacrimal glands: Secondary | ICD-10-CM | POA: Diagnosis not present

## 2019-12-13 ENCOUNTER — Other Ambulatory Visit: Payer: Self-pay

## 2019-12-13 DIAGNOSIS — I10 Essential (primary) hypertension: Secondary | ICD-10-CM

## 2019-12-13 DIAGNOSIS — E782 Mixed hyperlipidemia: Secondary | ICD-10-CM

## 2019-12-13 DIAGNOSIS — R7303 Prediabetes: Secondary | ICD-10-CM

## 2019-12-13 NOTE — Progress Notes (Signed)
Referral placed for Melvenia Beam Day, PharmD

## 2019-12-20 ENCOUNTER — Telehealth: Payer: Medicare HMO

## 2020-01-11 NOTE — Chronic Care Management (AMB) (Deleted)
Chronic Care Management Pharmacy  Name: Christine Keller  MRN: [id number] DOB: 1941/01/22  Chief Complaint/ HPI  Christine Keller,  79 y.o. , female presents for their Initial CCM visit with the clinical pharmacist via telephone due to COVID-19 Pandemic.  PCP : [name], MD  Their chronic conditions include: {CHL AMB CHRONIC MEDICAL CONDITIONS:[phone]}  Office Visits: 11/22/19: Visit w/ Dr. Lorelei Pont - Amlodipine previously D/C due to low blood pressure. Amlodipine removed from med list. No other med changes noted.   09/06/19: Visit w/ Dr. Lorelei Pont - No med changes noted. Labs ordered (cmp, cbc, a1c, lipid, tsh)  Consult Visit: None in last year.  Medications: Outpatient Encounter Medications as of 01/15/2020  Medication Sig  . Albuterol Sulfate (PROAIR RESPICLICK) 123XX123 (90 Base) MCG/ACT AEPB Inhale 2 puffs into the lungs every 6 (six) hours as needed (wheezing).  . CALCIUM-MAGNESIUM-ZINC PO Take by mouth daily.  . Cholecalciferol (VITAMIN D3) 2000 UNITS capsule Take 2,000 Units by mouth daily.  . Coenzyme Q10 (COQ10) 200 MG CAPS Take 1 capsule by mouth daily.    . Garlic Oil (ODORLESS GARLIC) XX123456 MG TABS Take 1 tablet by mouth daily.    Nyoka Cowden Tea 315 MG CAPS Take 1 capsule by mouth daily.    Marland Kitchen ipratropium-albuterol (DUONEB) 0.5-2.5 (3) MG/3ML SOLN Take 3 mLs by nebulization 3 (three) times daily as needed. Use when needed for shortness of breath exacerbation  . levothyroxine (SYNTHROID) 50 MCG tablet Take 1 tablet (50 mcg total) by mouth daily.  . Multiple Vitamin (MULTIVITAMIN) tablet Take 1 tablet by mouth daily.    . OMEGA 3 1000 MG CAPS Take 1 capsule by mouth 2 (two) times daily.    . Probiotic Product (PROBIOTIC DAILY PO) Take by mouth daily.  . ranitidine (ZANTAC) 150 MG tablet Take 150 mg by mouth 2 (two) times daily.  . simvastatin (ZOCOR) 20 MG tablet Take 0.5 tablets (10 mg total) by mouth daily.  . Thiamine HCl (VITAMIN B-1) 100 MG tablet Take 100 mg by mouth  daily.    Marland Kitchen tiotropium (SPIRIVA HANDIHALER) 18 MCG inhalation capsule Place 1 capsule (18 mcg total) into inhaler and inhale daily.  . Tiotropium Bromide-Olodaterol (STIOLTO RESPIMAT) 2.5-2.5 MCG/ACT AERS Inhale 2 puffs into the lungs daily.  . valsartan-hydrochlorothiazide (DIOVAN-HCT) 160-25 MG tablet Take 1 tablet by mouth daily.  . Vitamin Mixture (ESTER-C) 500-60 MG TABS Take 1 tablet by mouth daily.     No facility-administered encounter medications on file as of 01/15/2020.     Current Diagnosis/Assessment:  Goals Addressed   None     COPD / Asthma / Tobacco   Last spirometry score: ***  Gold Grade: {CHL HP [facility] Pharm COPD Gold [id number] Current COPD Classification:  {CHL HP Upstream Pharm COPD Classification:[phone]}  Eosinophil count:   Lab Results  Component Value Date/Time   EOSPCT 8.1 (H) 07/19/2015 09:52 AM  %                               Eos (Absolute):  Lab Results  Component Value Date/Time   EOSABS 0.7 07/19/2015 09:52 AM    Tobacco Status:  Social History   Tobacco Use  Smoking Status Current Every [place] Smoker  . Packs/Tilda Samudio: 1.00  . Years: 60.00  . Pack years: 60.00  . Types: Cigarettes  Smokeless Tobacco Never Used  Tobacco Comment   Less than 1 ppd.    Patient has failed these  meds in past: *** Patient is currently {CHL Controlled/[phone]} on the following medications: ProAir PRN, Duoneb TID PRN Using maintenance inhaler regularly? {yes/no:20286} Frequency of rescue inhaler use:  {CHL HP [facility] Pharm Inhaler KL:[id number]  We discussed:  {CHL HP Upstream Pharmacy discussion:[phone]}  Plan  Continue {CHL HP [facility]stream Pharmacy Plans:[phone]}  Pre-Diabetes   Recent Relevant Labs: Lab Results  Component Value Date/Time   HGBA1C 5.9 10/25/2019 03:03 PM   HGBA1C 6.0 06/09/2018 10:18 AM    Patient has failed these meds in past: None noted  Patient is currently {CHL  Controlled/[phone]} on the following medications: none  We discussed: {CHL HP [facility] Pharmacy discussion:[phone]}  Plan  Continue {CHL HP [facility] Pharmacy Plans:[phone]}    Hypertension   CMP Latest Ref Rng & Units 10/25/2019 06/09/2018 09/02/2017  Glucose 70 - 99 mg/dL 108(H) 107(H) 100(H)  BUN 6 - 23 mg/dL 11 16 16   Creatinine 0.40 - 1.20 mg/dL 0.84 0.77 0.82  Sodium 135 - 145 mEq/L 141 141 141  Potassium 3.5 - 5.1 mEq/L 3.6 4.0 3.8  Chloride 96 - 112 mEq/L 103 102 105  CO2 19 - 32 mEq/L 30 31 31   Calcium 8.4 - 10.5 mg/dL 10.3 10.2 10.3  Total Protein 6.0 - 8.3 g/dL 7.2 7.1 7.4  Total Bilirubin 0.2 - 1.2 mg/dL 0.5 0.5 0.6  Alkaline Phos 39 - 117 U/L 80 70 61  AST 0 - 37 U/L 22 21 21   ALT 0 - 35 U/L 17 17 16   GFR       65.32   77.03   71.78   BP today is:  {CHL HP UPSTREAM Pharmacist BP ranges:[phone]}  Office blood pressures are  BP Readings from Last 3 Encounters:  11/22/19 122/62  08/30/18 (!) 148/78  08/18/18 124/70   Blood pressure goal <140/90  Patient has failed these meds in the past: amlodipine (D/C due to BP going low), lisinopril? Patient is currently controlled on the following medications: valsartan/hctz 160/25mg  daily  Patient checks BP at home {CHL HP BP Monitoring Frequency:[id number]}  Patient home BP readings are ranging: ***  We discussed {CHL HP [facility] Pharmacy discussion:[phone]}  Plan  Continue {CHL HP [facility]stream Pharmacy Plans:[phone]}   Hyperlipidemia   Lipid Panel     Component Value Date/Time   CHOL 160 10/25/2019 1503   TRIG 136.0 10/25/2019 1503   HDL 72.70 10/25/2019 1503   CHOLHDL 2 10/25/2019 1503   VLDL 27.2 10/25/2019 1503   LDLCALC 60 10/25/2019 1503   LDLDIRECT 95.4 01/08/2012 0910   LDL goal <100   The 10-year ASCVD risk score Mikey Bussing DC Jr., et al., 2013) is: 38.5%   Values used to calculate the score:     Age: 58 years     Sex: Female     Is Non-Hispanic African American: No      Diabetic: No     Tobacco smoker: Yes     Systolic Blood Pressure: 123XX123 mmHg     Is BP treated: Yes     HDL Cholesterol: 72.7 mg/dL     Total Cholesterol: 160 mg/dL   Patient has failed these meds in past: niacin (rash) Patient is currently controlled on the following medications: simvastatin 20mg , fish oil 1000mg  1 BID, coenzyme Q10 200mg  daily  We discussed:  {CHL HP [facility] Pharmacy discussion:[phone]}  Plan  Continue {CHL HP [facility] Pharmacy Plans:[phone]}  Hypothyroidism   TSH  Date Value Ref Range Status  10/25/2019 0.84 0.35 - 4.50 uIU/mL Final     Patient  has failed these meds in past: *** Patient is currently {CHL Controlled/Uncontrolled:8101527677} on the following medications: levothyroxine 39mcg daily  We discussed:  {CHL HP [facility] Pharmacy discussion:[phone]}  Plan  Continue {CHL HP [facility]stream Pharmacy Plans:[phone]}   GERD    Patient has failed these meds in past: *** Patient is currently {CHL Controlled/Uncontrolled:8101527677} on the following medications:   We discussed:  {CHL HP Upstream Pharmacy discussion:[phone]}  Plan  Continue {CHL HP [facility]stream Pharmacy Plans:[phone]}    Supplementation    Patient has failed these meds in past: *** Patient is currently {CHL Controlled/Uncontrolled:8101527677} on the following medications: Vitamin D3 2000 units daily Ester C 500-60 Thiamin Probiotic Multivitamin Green Tea 315mg  Garlic Oil 500mg   Calcium/Magnesium/Zinc   We discussed:  {CHL HP Upstream Pharmacy discussion:[phone]}  Plan  Continue {CHL HP [facility] Pharmacy PH:[id number]

## 2020-01-15 ENCOUNTER — Telehealth: Payer: Medicare HMO

## 2020-01-17 ENCOUNTER — Telehealth: Payer: Self-pay | Admitting: Family Medicine

## 2020-01-17 NOTE — Progress Notes (Signed)
Patient was contacted to reschedule CCM appointment that was canceled.     Christine Keller [facility] Scheduler

## 2020-02-12 ENCOUNTER — Encounter: Payer: Self-pay | Admitting: Pharmacist

## 2020-02-12 ENCOUNTER — Ambulatory Visit: Payer: Medicare HMO | Admitting: Pharmacist

## 2020-02-12 ENCOUNTER — Other Ambulatory Visit: Payer: Self-pay

## 2020-02-12 DIAGNOSIS — E782 Mixed hyperlipidemia: Secondary | ICD-10-CM

## 2020-02-12 DIAGNOSIS — R7303 Prediabetes: Secondary | ICD-10-CM

## 2020-02-12 DIAGNOSIS — I10 Essential (primary) hypertension: Secondary | ICD-10-CM

## 2020-02-12 NOTE — Chronic Care Management (AMB) (Signed)
Chronic Care Management Pharmacy  Name: Christine Keller  MRN: [id number] DOB: Feb 24, 1941  Chief Complaint/ HPI  Christine Keller,  79 y.o. , female presents for their Initial CCM visit with the clinical pharmacist via telephone due to COVID-19 Pandemic.  PCP : Christine Mclean, MD  Their chronic conditions include: COPD/Tobacco Use Disorder, Pre-Diabetes, Hypertension, Hyperlipidemia, Hypothyroidism, GERD, Osteopenia  Office Visits: 11/22/19: Visit w/ Dr. Lorelei Keller - Amlodipine previously D/C due to low blood pressure. Amlodipine removed from med list. No other med changes noted.   09/06/19: Visit w/ Dr. Lorelei Keller - No med changes noted. Labs ordered (cmp, cbc, a1c, lipid, tsh)  Consult Visit: None in last year.  Medications: Outpatient Encounter Medications as of 02/12/2020  Medication Sig  . Albuterol Sulfate (PROAIR RESPICLICK) 123XX123 (90 Base) MCG/ACT AEPB Inhale 2 puffs into the lungs every 6 (six) hours as needed (wheezing).  . CALCIUM-MAGNESIUM-ZINC PO Take by mouth daily.  . Cholecalciferol (VITAMIN D3) 2000 UNITS capsule Take 2,000 Units by mouth daily.  . Coenzyme Q10 (COQ10) 200 MG CAPS Take 1 capsule by mouth daily.    . famotidine (PEPCID) 20 MG tablet Take 20 mg by mouth at bedtime.  . Garlic Oil (ODORLESS GARLIC) [id number] MG TABS Take 1 tablet by mouth daily.    Nyoka Cowden Tea 315 MG CAPS Take 1 capsule by mouth daily.    Marland Kitchen ipratropium-albuterol (DUONEB) 0.5-2.5 (3) MG/3ML SOLN Take 3 mLs by nebulization 3 (three) times daily as needed. Use when needed for shortness of breath exacerbation  . levothyroxine (SYNTHROID) 50 MCG tablet Take 1 tablet (50 mcg total) by mouth daily.  . Multiple Vitamin (MULTIVITAMIN) tablet Take 1 tablet by mouth daily.    . OMEGA 3 1000 MG CAPS Take 1 capsule by mouth 2 (two) times daily.    . Probiotic Product (PROBIOTIC DAILY PO) Take by mouth daily.  . simvastatin (ZOCOR) 20 MG tablet Take 0.5 tablets (10 mg total) by mouth daily.  . Thiamine HCl (VITAMIN  B-1) 100 MG tablet Take 100 mg by mouth daily.    Marland Kitchen tiotropium (SPIRIVA HANDIHALER) 18 MCG inhalation capsule Place 1 capsule (18 mcg total) into inhaler and inhale daily.  . valsartan-hydrochlorothiazide (DIOVAN-HCT) 160-25 MG tablet Take 1 tablet by mouth daily.  . Vitamin Mixture (ESTER-C) 500-60 MG TABS Take 1 tablet by mouth daily.    . ranitidine (ZANTAC) 150 MG tablet Take 150 mg by mouth 2 (two) times daily.  . Tiotropium Bromide-Olodaterol (STIOLTO RESPIMAT) 2.5-2.5 MCG/ACT AERS Inhale 2 puffs into the lungs daily. (Patient not taking: Reported on 02/12/2020)   No facility-administered encounter medications on file as of 02/12/2020.   SDOH Screenings   Alcohol Screen:   . Last Alcohol Screening Score (AUDIT):   Depression (PHQ2-9):   . PHQ-2 Score:   Financial Resource Strain:   . Difficulty of Paying Living Expenses:   Food Insecurity:   . Worried About Charity fundraiser in the Last Year:   . [place] in the Last Year:   Housing:   . Last Housing Risk Score:   Physical Activity:   . Days of Exercise per Week:   . Minutes of Exercise per Session:   Social Connections:   . Frequency of Communication with Friends and Family:   . Frequency of Social Gatherings with Friends and Family:   . Attends Religious Services:   . Active Member of Clubs or Organizations:   . Attends Archivist Meetings:   .  Marital Status:   Stress:   . Feeling of Stress :   Tobacco Use: High Risk  . Smoking Tobacco Use: Current Every Christine Keller Smoker  . Smokeless Tobacco Use: Never Used  Transportation Needs:   . Film/video editor (Medical):   Marland Kitchen Lack of Transportation (Non-Medical):      Current Diagnosis/Assessment:  Goals Addressed            This Visit's Progress   . Pharmacy Care Plan       CARE PLAN ENTRY  Current Barriers:  . Chronic Disease Management support, education, and care coordination needs related to COPD/Tobacco Use Disorder, Pre-Diabetes,  Hypertension, Hyperlipidemia, Hypothyroidism, GERD, Osteopenia   Hypertension . Pharmacist Clinical Goal(s): o Over the next 90 days, patient will work with PharmD and providers to maintain BP goal <140/90 . Current regimen:  o Valsartan/hctz 160/25mg  daily  . Interventions: o Discussed the cause of decreasing diastolic BP . Patient self care activities - Over the next 90 days, patient will: o Check BP 1-2 times per week, document, and provide at future appointments o Ensure daily salt intake < 2300 mg/[name]  Hyperlipidemia . Pharmacist Clinical Goal(s): o Over the next 90 days, patient will work with PharmD and providers to maintain LDL goal < 100 . Current regimen:  o Simvastatin 20mg  1/2 tab daily, fish oil 1000mg  1 BID, coenzyme Q10 200mg  daily . Interventions: o Discussed diet and exercise . Patient self care activities - Over the next 90 days, patient will: o Maintain cholesterol medication regimen.   Pre-Diabetes . Pharmacist Clinical Goal(s): o Over the next 90 days, patient will work with PharmD and providers to maintain A1c goal <6.5% . Current regimen:  o Diet and exercise management   . Interventions: o Extensively discussed the importance of limiting carbohydrates (30-45 grams per meal) . Patient self care activities - Over the next 90 days, patient will: o Limit carbohydrates to 30-45 grams per meal  Supplementation . Pharmacist Clinical Goal(s) o Over the next 90 days, patient will work with PharmD and providers to reduce polypharmacy due to supplementation . Current regimen:  o Ester C 500-60 o Thiamin o Probiotic o Multivitamin o Green Tea 315mg  o Garlic Oil 500mg   . Interventions: o Discussed philosophy of supplementation o -Consider discontinue garlic o -Consider discontinue green tea o -Consider discontinue B complex o -Consider discontinue Ester C . Patient self care activities - Over the next 90 days, patient will: o Consider discontinuing  supplements that may not be beneficial but costly and increasing pill burden  Medication management . Pharmacist Clinical Goal(s): o Over the next 90 days, patient will work with PharmD and providers to maintain optimal medication adherence . [facility] . Interventions o Comprehensive medication review performed. o Continue current medication management strategy . 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 Packs/Latravious Levitt: 1.00  . Years: 60.00  . Pack years: 60.00  . Types: Cigarettes  Smokeless Tobacco Never Used  Tobacco Comment   Less than 1 ppd.    Patient has failed these meds in past: Stiolto (cost; approved, but still expensive) Patient is currently controlled on the following medications: ProAir PRN, Duoneb TID PRN, spiriva 40mcg daily (generic from [place] for 3 month supply = $80) Using maintenance inhaler regularly? Yes Frequency of rescue inhaler use:  never (but uses nebs once every other Lonnie Rosado)  Uses nebs every other Marcellina Jonsson as a precaution.  Feels SOB with exertion Has been smoking 1/2 pack per Tsion Inghram and wants to continue.   We discussed:  smoking cessation and offered support  when/if patient decides to quit  Plan -Continue current medications  Pre-Diabetes   Recent Relevant Labs: Lab Results  Component Value Date/Time   HGBA1C 5.9 10/25/2019 03:03 PM   HGBA1C 6.0 06/09/2018 10:18 AM    A1c goal <6.5%  Patient has failed these meds in past: None noted  Patient is currently controlled on the following medications: none  Diet "I love macaroni, rice, potatoes" Drinks sierra mist daily  We discussed: diet and exercise extensively with emphasis on carbohydrate counting. Thoroughly explained carbohydrate goals for women (30-45g/meal) and men (45-60g/meal to address Mr. Pilgreen).  Noted the following with the patient and her husband from foods in their pantry that they often eat  Macaroni elbows (usually eats with eggs or ketchup vs cheese) Serving Size: 1/2 cup Total Carb: 42 gram  Sierra Mist (only soda w/o high fructose corn syrup per Mr. Kuruvilla) Serving Size: 1 bottle (16 oz) Total Carb: 49 gram  Patient and husband appreciate the context of their carb consumption considering the goal amounts.  Patient would like to decrease sierra mist intake by drinking water with her meals and a limited amount of sierra mist if she has a snack.   Plan -Consider drinking water with meals and drink sierra mist with snacks -Consider limiting carbohydrates to 30-45 grams per meal -Continue control with diet and exercise    Hypertension   CMP Latest Ref Rng & Units 10/25/2019 06/09/2018 09/02/2017  Glucose 70 - 99 mg/dL 108(H) 107(H) 100(H)  BUN 6 - 23 mg/dL 11 16 16   Creatinine 0.40 - 1.20 mg/dL 0.84 0.77 0.82  Sodium 135 - 145 mEq/L 141 141 141  Potassium 3.5 - 5.1 mEq/L 3.6 4.0 3.8  Chloride 96 - 112 mEq/L 103 102 105  CO2 19 - 32 mEq/L 30 31 31   Calcium 8.4 - 10.5 mg/dL 10.3 10.2 10.3  Total Protein 6.0 - 8.3 g/dL 7.2 7.1 7.4  Total Bilirubin 0.2 - 1.2 mg/dL 0.5 0.5 0.6  Alkaline Phos 39 - 117 U/L 80 70 61  AST 0 - 37 U/L 22 21 21   ALT 0 - 35 U/L 17 17  16   GFR       65.32   77.03   71.78   BP today is: Unable to assess due to phone visit   Office blood pressures are  BP Readings from Last 3 Encounters:  11/22/19 122/62  08/30/18 (!) 148/78  08/18/18 124/70   Blood pressure goal <140/90  Patient has failed these meds in the past: amlodipine (D/C due to BP going  low), lisinopril? Patient is currently controlled on the following medications: valsartan/hctz 160/25mg  daily  Patient checks BP at home 1-2x per week  Patient home BP readings are ranging: 130s-140s/55-70s  149/70 77 pulse this morning Blood pressures are elevated   Denies HA, dizziness, or chest pain   Patient asks how frequently she should check her BP. Not overly concerned with hyper or hypotension  We discussed Why diastolic BP decreases with age and the fact that there is not much to do to prevent/assist this condition other than refrain from driving it lower.   Plan -Continue current medications  -Assess diastolic BP average in 2 months  Future -If diastolic BP is found to be <60 consistently can consider adjusting HTN regimen.   Hyperlipidemia   Lipid Panel     Component Value Date/Time   CHOL 160 10/25/2019 1503   TRIG 136.0 10/25/2019 1503   HDL 72.70 10/25/2019 1503   CHOLHDL 2 10/25/2019 1503   VLDL 27.2 10/25/2019 1503   LDLCALC 60 10/25/2019 1503   LDLDIRECT 95.4 01/08/2012 0910   LDL goal <100   The 10-year ASCVD risk score Mikey Bussing DC Jr., et al., 2013) is: 38.5%   Values used to calculate the score:     Age: 30 years     Sex: Female     Is Non-Hispanic African American: No     Diabetic: No     Tobacco smoker: Yes     Systolic Blood Pressure: 123XX123 mmHg     Is BP treated: Yes     HDL Cholesterol: 72.7 mg/dL     Total Cholesterol: 160 mg/dL   Patient has failed these meds in past: niacin (rash) Patient is currently controlled on the following medications: simvastatin 20mg  1/2 tab daily, fish oil 1000mg  1 BID, coenzyme Q10 200mg   daily  We discussed:  diet and exercise extensively   Diet  I love sweets   Plan -Continue current medications   Future Plan -Increase simvastatin to 1 full tablet and D/C fish oil   Hypothyroidism   TSH  Date Value Ref Range Status  10/25/2019 0.84 0.35 - 4.50 uIU/mL Final     Patient has failed these meds in past: None noted  Patient is currently controlled on the following medications: levothyroxine 75mcg daily  Plan -Continue current medications   GERD    Patient has failed these meds in past: None noted  Patient is currently controlled on the following medications: pepcid 20mg  HS  Breakthrough Sx: Once in a blue moon Breakthrough Tx: Walk around until it eases up Triggers: "There isn't one specific thing"  Plan -Continue current medications   Osteopenia    07/02/13: DEXA - Osteopenia T-Score: -1.9 at femoral neck  Patient has failed these meds in past: None noted  Patient is currently controlled on the following medications: Calcium/Magnesium/Zinc, Vitamin D3 2000 units daily  Plan -Continue current medications -Consider getting a repeat DEXA    Supplementation   Ester C 500-60 Thiamin Probiotic Multivitamin Green Tea 315mg  Garlic Oil 500mg    Discussed my philosophy on supplementation:  1) Is there evidence that it helps with the condition/symptom you are attempting to treat/resolve? (efficacy) 2) Does it cause side effects? (harm) 3) Does it interact with your other medications? (interactions) 4) Are you able to afford this in your budget? (affordability)  If all things are satisfied then the patient can make their choice whether they would like to continue with supplementation noting there may not be a benefit to the  supplementation.    Plan -Consider discontinue garlic -Consider discontinue green tea -Consider discontinue B complex -Consider discontinue Ester C  Meds to D/C from list Stiolto (cost) Garlic (not needed) green tea  (not needed) B complex (not needed) Ester C (not needed)

## 2020-02-21 NOTE — Patient Instructions (Signed)
Visit Information  Goals Addressed            This Visit's Progress   . Pharmacy Care Plan       CARE PLAN ENTRY  Current Barriers:  . Chronic Disease Management support, education, and care coordination needs related to COPD/Tobacco Use Disorder, Pre-Diabetes, Hypertension, Hyperlipidemia, Hypothyroidism, GERD, Osteopenia   Hypertension . Pharmacist Clinical Goal(s): o Over the next 90 days, patient will work with PharmD and providers to maintain BP goal <140/90 . Current regimen:  o Valsartan/hctz 160/25mg  daily  . Interventions: o Discussed the cause of decreasing diastolic BP . Patient self care activities - Over the next 90 days, patient will: o Check BP 1-2 times per week, document, and provide at future appointments o Ensure daily salt intake < 2300 mg/Marvetta Vohs  Hyperlipidemia . Pharmacist Clinical Goal(s): o Over the next 90 days, patient will work with PharmD and providers to maintain LDL goal < 100 . Current regimen:  o Simvastatin 20mg  1/2 tab daily, fish oil 1000mg  1 BID, coenzyme Q10 200mg  daily . Interventions: o Discussed diet and exercise . Patient self care activities - Over the next 90 days, patient will: o Maintain cholesterol medication regimen.   Pre-Diabetes . Pharmacist Clinical Goal(s): o Over the next 90 days, patient will work with PharmD and providers to maintain A1c goal <6.5% . Current regimen:  o Diet and exercise management   . Interventions: o Extensively discussed the importance of limiting carbohydrates (30-45 grams per meal) . Patient self care activities - Over the next 90 days, patient will: o Limit carbohydrates to 30-45 grams per meal  Supplementation . Pharmacist Clinical Goal(s) o Over the next 90 days, patient will work with PharmD and providers to reduce polypharmacy due to supplementation . Current regimen:  o Ester C 500-60 o Thiamin o Probiotic o Multivitamin o Green Tea 315mg  o Garlic Oil 500mg    . Interventions: o Discussed philosophy of supplementation o -Consider discontinue garlic o -Consider discontinue green tea o -Consider discontinue B complex o -Consider discontinue Ester C . Patient self care activities - Over the next 90 days, patient will: o Consider discontinuing supplements that may not be beneficial but costly and increasing pill burden  Medication management . Pharmacist Clinical Goal(s): o Over the next 90 days, patient will work with PharmD and providers to maintain optimal medication adherence . [facility] . Interventions o Comprehensive medication review performed. o Continue current medication management strategy . Patient self care activities - Over the next 90 days, patient will: o Focus on medication adherence by filling medications appropriately  o Take medications as prescribed o Report any questions or concerns to PharmD and/or provider(s)  Initial goal documentation        Ms. Lisby was given information about Chronic Care Management services today including:  1. CCM service includes personalized support from designated clinical staff supervised by her physician, including individualized plan of care and coordination with other care providers 2. 24/7 contact phone numbers for assistance for urgent and routine care needs. 3. Standard insurance, coinsurance, copays and deductibles apply for chronic care management only during months in which we provide at least 20 minutes of these services. Most insurances cover these services at 100%, however patients may be responsible for any copay, coinsurance and/or deductible if applicable. This service may help you avoid the need for more expensive face-to-face services. 4. Only one practitioner may furnish and bill the service in a calendar month. 5. The patient may  stop CCM services at any time (effective at the end of the month) by phone call to the office staff.  Patient agreed to  services and verbal consent obtained.   The patient verbalized understanding of instructions provided today and agreed to receive a mailed copy of patient instruction and/or educational materials. Telephone follow up appointment with pharmacy team member scheduled for: 04/11/2020  Melvenia Beam Cash Duce, PharmD Clinical Pharmacist [place] [phone]    Prediabetes Eating Plan Prediabetes is a condition that causes blood sugar (glucose) levels to be higher than normal. This increases the risk for developing diabetes. In order to prevent diabetes from developing, your health care provider may recommend a diet and other lifestyle changes to help you:  Control your blood glucose levels.  Improve your cholesterol levels.  Manage your blood pressure. Your health care provider may recommend working with a diet and nutrition specialist (dietitian) to make a meal plan that is best for you. What are tips for following this plan? Lifestyle  Set weight loss goals with the help of your health care team. It is recommended that most people with prediabetes lose 7% of their current body weight.  Exercise for at least 30 minutes at least 5 days a week.  Attend a support group or seek ongoing support from a mental health counselor.  Take over-the-counter and prescription medicines only as told by your health care provider. Reading food labels  Read food labels to check the amount of fat, salt (sodium), and sugar in prepackaged foods. Avoid foods that have: ? Saturated fats. ? Trans fats. ? Added sugars.  Avoid foods that have more than 300 milligrams (mg) of sodium per serving. Limit your daily sodium intake to less than 2,300 mg each Graceland Wachter. Shopping  Avoid buying pre-made and processed foods. Cooking  Cook with olive oil. Do not use butter, lard, or ghee.  Bake, broil, grill, or boil foods. Avoid frying. Meal planning   Work with your dietitian to develop an  eating plan that is right for you. This may include: ? Tracking how many calories you take in. Use a food diary, notebook, or mobile application to track what you eat at each meal. ? Using the glycemic index (GI) to plan your meals. The index tells you how quickly a food will raise your blood glucose. Choose low-GI foods. These foods take a longer time to raise blood glucose.  Consider following a Mediterranean diet. This diet includes: ? Several servings each Savaughn Karwowski of fresh fruits and vegetables. ? Eating fish at least twice a week. ? Several servings each Justinian Miano of whole grains, beans, nuts, and seeds. ? Using olive oil instead of other fats. ? Moderate alcohol consumption. ? Eating small amounts of red meat and whole-fat dairy.  If you have high blood pressure, you may need to limit your sodium intake or follow a diet such as the DASH eating plan. DASH is an eating plan that aims to lower high blood pressure. What foods are recommended? The items listed below may not be a complete list. Talk with your dietitian about what dietary choices are best for you. Grains Whole grains, such as whole-wheat or whole-grain breads, crackers, cereals, and pasta. Unsweetened oatmeal. Bulgur. Barley. Quinoa. Brown rice. Corn or whole-wheat flour tortillas or taco shells. Vegetables Lettuce. Spinach. Peas. Beets. Cauliflower. Cabbage. Broccoli. Carrots. Tomatoes. Squash. Eggplant. Herbs. Peppers. Onions. Cucumbers. Brussels sprouts. Fruits Berries. Bananas. Apples. Oranges. Grapes. Papaya. Mango. Pomegranate. Kiwi. Grapefruit. Cherries. Meats and  other protein foods Seafood. Poultry without skin. Lean cuts of pork and beef. Tofu. Eggs. Nuts. Beans. Dairy Low-fat or fat-free dairy products, such as yogurt, cottage cheese, and cheese. Beverages Water. Tea. Coffee. Sugar-free or diet soda. Seltzer water. Lowfat or no-fat milk. Milk alternatives, such as soy or almond milk. Fats and oils Olive oil. Canola oil.  Sunflower oil. Grapeseed oil. Avocado. Walnuts. Sweets and desserts Sugar-free or low-fat pudding. Sugar-free or low-fat ice cream and other frozen treats. Seasoning and other foods Herbs. Sodium-free spices. Mustard. Relish. Low-fat, low-sugar ketchup. Low-fat, low-sugar barbecue sauce. Low-fat or fat-free mayonnaise. What foods are not recommended? The items listed below may not be a complete list. Talk with your dietitian about what dietary choices are best for you. Grains Refined white flour and flour products, such as bread, pasta, snack foods, and cereals. Vegetables Canned vegetables. Frozen vegetables with butter or cream sauce. Fruits Fruits canned with syrup. Meats and other protein foods Fatty cuts of meat. Poultry with skin. Breaded or fried meat. Processed meats. Dairy Full-fat yogurt, cheese, or milk. Beverages Sweetened drinks, such as sweet iced tea and soda. Fats and oils Butter. Lard. Ghee. Sweets and desserts Baked goods, such as cake, cupcakes, pastries, cookies, and cheesecake. Seasoning and other foods Spice mixes with added salt. Ketchup. Barbecue sauce. Mayonnaise. Summary  To prevent diabetes from developing, you may need to make diet and other lifestyle changes to help control blood sugar, improve cholesterol levels, and manage your blood pressure.  Set weight loss goals with the help of your health care team. It is recommended that most people with prediabetes lose 7 percent of their current body weight.  Consider following a Mediterranean diet that includes plenty of fresh fruits and vegetables, whole grains, beans, nuts, seeds, fish, lean meat, low-fat dairy, and healthy oils. This information is not intended to replace advice given to you by your health care provider. Make sure you discuss any questions you have with your health care provider. Document Revised: 01/13/2019 Document Reviewed: 11/25/2016 Elsevier Patient Education  2020 [employer].

## 2020-04-11 ENCOUNTER — Telehealth: Payer: Medicare HMO

## 2020-04-12 ENCOUNTER — Encounter: Payer: Self-pay | Admitting: Family Medicine

## 2020-04-12 DIAGNOSIS — Z760 Encounter for issue of repeat prescription: Secondary | ICD-10-CM

## 2020-04-12 DIAGNOSIS — J449 Chronic obstructive pulmonary disease, unspecified: Secondary | ICD-10-CM

## 2020-04-13 ENCOUNTER — Encounter: Payer: Self-pay | Admitting: Family Medicine

## 2020-04-15 ENCOUNTER — Other Ambulatory Visit: Payer: Self-pay

## 2020-04-15 MED ORDER — ALBUTEROL SULFATE HFA 108 (90 BASE) MCG/ACT IN AERS: 2.0000 | INHALATION_SPRAY | Freq: Four times a day (QID) | RESPIRATORY_TRACT | 5 refills | Status: DC | PRN

## 2020-04-15 MED ORDER — PROAIR RESPICLICK 108 (90 BASE) MCG/ACT IN AEPB: 2.0000 | INHALATION_SPRAY | Freq: Four times a day (QID) | RESPIRATORY_TRACT | 5 refills | Status: DC | PRN

## 2020-04-15 MED ORDER — SPIRIVA HANDIHALER 18 MCG IN CAPS: 18.0000 ug | ORAL_CAPSULE | Freq: Every day | RESPIRATORY_TRACT | 1 refills | Status: DC

## 2020-04-15 MED ORDER — IPRATROPIUM-ALBUTEROL 0.5-2.5 (3) MG/3ML IN SOLN: 3.0000 mL | Freq: Three times a day (TID) | RESPIRATORY_TRACT | 1 refills | Status: DC | PRN

## 2020-04-15 NOTE — Addendum Note (Signed)
Addended by: Wynonia Musty A on: 04/15/2020 11:40 AM   Modules accepted: Orders

## 2020-04-17 DIAGNOSIS — R69 Illness, unspecified: Secondary | ICD-10-CM | POA: Diagnosis not present

## 2020-05-14 DIAGNOSIS — R69 Illness, unspecified: Secondary | ICD-10-CM | POA: Diagnosis not present

## 2020-06-21 ENCOUNTER — Telehealth: Payer: Self-pay | Admitting: Pharmacist

## 2020-06-21 NOTE — Progress Notes (Addendum)
Chronic Care Management Pharmacy Assistant   Name: Christine Keller  MRN: [id number] DOB: August 21, 1941  Reason for Encounter: Disease State  Patient Questions:  1.  Have you seen any other providers since your last visit? No  2.  Any changes in your medicines or health? No  PCP : Copland, Gay Filler, MD   Their chronic conditions include: COPD/Tobacco Use Disorder, Pre-Diabetes, Hypertension, Hyperlipidemia, Hypothyroidism, GERD, Osteopenia  No Office visits, Consults, or Hospitalizations Allergies:   Allergies  Allergen Reactions  . Prednisone Shortness Of Breath    Per pt. Report 08/03/18  . Codeine     REACTION: hallucinations  . Niacin     REACTION: rash    Medications: Outpatient Encounter Medications as of 06/21/2020  Medication Sig  . albuterol (VENTOLIN HFA) 108 (90 Base) MCG/ACT inhaler Inhale 2 puffs into the lungs every 6 (six) hours as needed for wheezing or shortness of breath.  Marland Kitchen CALCIUM-MAGNESIUM-ZINC PO Take by mouth daily.  . Cholecalciferol (VITAMIN D3) 2000 UNITS capsule Take 2,000 Units by mouth daily.  . Coenzyme Q10 (COQ10) 200 MG CAPS Take 1 capsule by mouth daily.    . famotidine (PEPCID) 20 MG tablet Take 20 mg by mouth at bedtime.  . Garlic Oil (ODORLESS GARLIC) 500 MG TABS Take 1 tablet by mouth daily.    Nyoka Cowden Tea 315 MG CAPS Take 1 capsule by mouth daily.    Marland Kitchen ipratropium-albuterol (DUONEB) 0.5-2.5 (3) MG/3ML SOLN Take 3 mLs by nebulization 3 (three) times daily as needed. Use when needed for shortness of breath exacerbation  . levothyroxine (SYNTHROID) 50 MCG tablet Take 1 tablet (50 mcg total) by mouth daily.  . Multiple Vitamin (MULTIVITAMIN) tablet Take 1 tablet by mouth daily.    . OMEGA 3 1000 MG CAPS Take 1 capsule by mouth 2 (two) times daily.    . Probiotic Product (PROBIOTIC DAILY PO) Take by mouth daily.  . ranitidine (ZANTAC) 150 MG tablet Take 150 mg by mouth 2 (two) times daily.  . simvastatin (ZOCOR) 20 MG tablet Take 0.5 tablets  (10 mg total) by mouth daily.  . Thiamine HCl (VITAMIN B-1) 100 MG tablet Take 100 mg by mouth daily.    Marland Kitchen tiotropium (SPIRIVA HANDIHALER) 18 MCG inhalation capsule Place 1 capsule (18 mcg total) into inhaler and inhale daily.  . Tiotropium Bromide-Olodaterol (STIOLTO RESPIMAT) 2.5-2.5 MCG/ACT AERS Inhale 2 puffs into the lungs daily. (Patient not taking: Reported on 02/12/2020)  . valsartan-hydrochlorothiazide (DIOVAN-HCT) 160-25 MG tablet Take 1 tablet by mouth daily.  . Vitamin Mixture (ESTER-C) 500-60 MG TABS Take 1 tablet by mouth daily.     No facility-administered encounter medications on file as of 06/21/2020.    Current Diagnosis: Patient Active Problem List   Diagnosis Date Noted  . Routine general medical examination at a health care facility 08/14/2015  . Hypothyroidism 12/05/2014  . Hyperlipidemia 08/08/2014  . Osteopenia 12/27/2013  . GERD (gastroesophageal reflux disease) 12/27/2013  . Guaiac positive stools 12/27/2013  . Ovarian tumor 05/16/2012  . Borderline diabetes 04/16/2011  . Preventative health care 04/15/2011  . Hyperglycemia 04/15/2011  . Essential hypertension 12/22/2010  . Carpal tunnel syndrome 12/22/2010  . COPD (chronic obstructive pulmonary disease) ([place]) 11/15/2009  . POLYCYTHEMIA 10/30/2009  . TOBACCO ABUSE 10/30/2009  . DYSPNEA 10/30/2009    Goals Addressed   None    Reviewed chart prior to disease state call. Spoke with patient regarding BP  Recent Office Vitals: BP Readings from Last 3 Encounters:  11/22/19  122/62  08/30/18 (!) 148/78  08/18/18 124/70   Pulse Readings from Last 3 Encounters:  11/22/19 92  08/30/18 74  08/18/18 79    Wt Readings from Last 3 Encounters:  11/22/19 113 lb 12.8 oz (51.6 kg)  08/30/18 122 lb (55.3 kg)  08/18/18 121 lb (54.9 kg)     Kidney Function Lab Results  Component Value Date/Time   CREATININE 0.84 10/25/2019 03:03 PM   CREATININE 0.77 06/09/2018 10:18 AM   CREATININE 0.89 02/05/2011 08:57 AM    GFR 65.32 10/25/2019 03:03 PM   GFRNONAA 68 (L) 05/25/2012 12:00 PM   GFRAA 79 (L) 05/25/2012 12:00 PM    BMP Latest Ref Rng & Units 10/25/2019 06/09/2018 09/02/2017  Glucose 70 - 99 mg/dL 108(H) 107(H) 100(H)  BUN 6 - 23 mg/dL 11 16 16   Creatinine 0.40 - 1.20 mg/dL 0.84 0.77 0.82  Sodium 135 - 145 mEq/L 141 141 141  Potassium 3.5 - 5.1 mEq/L 3.6 4.0 3.8  Chloride 96 - 112 mEq/L 103 102 105  CO2 19 - 32 mEq/L 30 31 31   Calcium 8.4 - 10.5 mg/dL 10.3 10.2 10.3    . Current antihypertensive regimen:  ? Valsartan/hctz 160/25mg  daily  . How often are you checking your Blood Pressure? weekly . Current home BP readings: 138/66 on  06-09-20. . What recent interventions/DTPs have been made by any provider to improve Blood Pressure control since last CPP Visit: None . Any recent hospitalizations or ED visits since last visit with CPP? No . What diet changes have been made to improve Blood Pressure Control?  o Patient states she has not changed anything.  Patient states they barely eat. . What exercise is being done to improve your Blood Pressure Control?  o Patient reports she does a little walking but with her COPD she is not able to get out like she wants to.  Patient states that they have a granddaughter that keeps them moving around a lot.   Adherence Review: Is the patient currently on ACE/ARB medication? Yes Valsartan-hctz 160-25 mg daily Does the patient have >5 day gap between last estimated fill dates? No    Follow-Up:  Pharmacist Review   [name], [facility] [place] Pharmacist Assistant [phone]  Reviewed by: De Blanch, PharmD Clinical Pharmacist [facility] [phone]

## 2020-07-02 DIAGNOSIS — Z961 Presence of intraocular lens: Secondary | ICD-10-CM | POA: Diagnosis not present

## 2020-07-02 DIAGNOSIS — H353131 Nonexudative age-related macular degeneration, bilateral, early dry stage: Secondary | ICD-10-CM | POA: Diagnosis not present

## 2020-07-09 ENCOUNTER — Ambulatory Visit: Payer: Medicare HMO

## 2020-07-10 ENCOUNTER — Telehealth: Payer: Self-pay | Admitting: Pharmacist

## 2020-07-10 NOTE — Progress Notes (Addendum)
Chronic Care Management Pharmacy Assistant   Name: Analiya Porco  MRN: [id number] DOB: May 31, 1941  Reason for Encounter: Disease State  Patient Questions:  1.  Have you seen any other providers since your last visit? No  2.  Any changes in your medicines or health? No   PCP : Copland, Gay Filler, MD   Their chronic conditions include: COPD/Tobacco Use Disorder, Pre-Diabetes, Hypertension, Hyperlipidemia, Hypothyroidism, GERD, Osteopenia  No office visits, Consults, or Hospitalizations since last CCM visit on 02-12-20.  Allergies:   Allergies  Allergen Reactions  . Prednisone Shortness Of Breath    Per pt. Report 08/03/18  . Codeine     REACTION: hallucinations  . Niacin     REACTION: rash    Medications: Outpatient Encounter Medications as of 07/10/2020  Medication Sig  . albuterol (VENTOLIN HFA) 108 (90 Base) MCG/ACT inhaler Inhale 2 puffs into the lungs every 6 (six) hours as needed for wheezing or shortness of breath.  Marland Kitchen CALCIUM-MAGNESIUM-ZINC PO Take by mouth daily.  . Cholecalciferol (VITAMIN D3) 2000 UNITS capsule Take 2,000 Units by mouth daily.  . Coenzyme Q10 (COQ10) 200 MG CAPS Take 1 capsule by mouth daily.    . famotidine (PEPCID) 20 MG tablet Take 20 mg by mouth at bedtime.  . Garlic Oil (ODORLESS GARLIC) 336 MG TABS Take 1 tablet by mouth daily.    Nyoka Cowden Tea 315 MG CAPS Take 1 capsule by mouth daily.    Marland Kitchen ipratropium-albuterol (DUONEB) 0.5-2.5 (3) MG/3ML SOLN Take 3 mLs by nebulization 3 (three) times daily as needed. Use when needed for shortness of breath exacerbation  . levothyroxine (SYNTHROID) 50 MCG tablet Take 1 tablet (50 mcg total) by mouth daily.  . Multiple Vitamin (MULTIVITAMIN) tablet Take 1 tablet by mouth daily.    . OMEGA 3 1000 MG CAPS Take 1 capsule by mouth 2 (two) times daily.    . Probiotic Product (PROBIOTIC DAILY PO) Take by mouth daily.  . ranitidine (ZANTAC) 150 MG tablet Take 150 mg by mouth 2 (two) times daily.  . simvastatin  (ZOCOR) 20 MG tablet Take 0.5 tablets (10 mg total) by mouth daily.  . Thiamine HCl (VITAMIN B-1) 100 MG tablet Take 100 mg by mouth daily.    Marland Kitchen tiotropium (SPIRIVA HANDIHALER) 18 MCG inhalation capsule Place 1 capsule (18 mcg total) into inhaler and inhale daily.  . Tiotropium Bromide-Olodaterol (STIOLTO RESPIMAT) 2.5-2.5 MCG/ACT AERS Inhale 2 puffs into the lungs daily. (Patient not taking: Reported on 02/12/2020)  . valsartan-hydrochlorothiazide (DIOVAN-HCT) 160-25 MG tablet Take 1 tablet by mouth daily.  . Vitamin Mixture (ESTER-C) 500-60 MG TABS Take 1 tablet by mouth daily.     No facility-administered encounter medications on file as of 07/10/2020.    Current Diagnosis: Patient Active Problem List   Diagnosis Date Noted  . Routine general medical examination at a health care facility 08/14/2015  . Hypothyroidism 12/05/2014  . Hyperlipidemia 08/08/2014  . Osteopenia 12/27/2013  . GERD (gastroesophageal reflux disease) 12/27/2013  . Guaiac positive stools 12/27/2013  . Ovarian tumor 05/16/2012  . Borderline diabetes 04/16/2011  . Preventative health care 04/15/2011  . Hyperglycemia 04/15/2011  . Essential hypertension 12/22/2010  . Carpal tunnel syndrome 12/22/2010  . COPD (chronic obstructive pulmonary disease) ([place]) 11/15/2009  . POLYCYTHEMIA 10/30/2009  . TOBACCO ABUSE 10/30/2009  . DYSPNEA 10/30/2009    Goals Addressed   None    Reviewed chart prior to disease state call. Spoke with patient regarding BP  Recent Office Vitals:  BP Readings from Last 3 Encounters:  11/22/19 122/62  08/30/18 (!) 148/78  08/18/18 124/70   Pulse Readings from Last 3 Encounters:  11/22/19 92  08/30/18 74  08/18/18 79    Wt Readings from Last 3 Encounters:  11/22/19 113 lb 12.8 oz (51.6 kg)  08/30/18 122 lb (55.3 kg)  08/18/18 121 lb (54.9 kg)     Kidney Function Lab Results  Component Value Date/Time   CREATININE 0.84 10/25/2019 03:03 PM   CREATININE 0.77 06/09/2018 10:18 AM     CREATININE 0.89 02/05/2011 08:57 AM   GFR 65.32 10/25/2019 03:03 PM   GFRNONAA 68 (L) 05/25/2012 12:00 PM   GFRAA 79 (L) 05/25/2012 12:00 PM    BMP Latest Ref Rng & Units 10/25/2019 06/09/2018 09/02/2017  Glucose 70 - 99 mg/dL 108(H) 107(H) 100(H)  BUN 6 - 23 mg/dL 11 16 16   Creatinine 0.40 - 1.20 mg/dL 0.84 0.77 0.82  Sodium 135 - 145 mEq/L 141 141 141  Potassium 3.5 - 5.1 mEq/L 3.6 4.0 3.8  Chloride 96 - 112 mEq/L 103 102 105  CO2 19 - 32 mEq/L 30 31 31   Calcium 8.4 - 10.5 mg/dL 10.3 10.2 10.3      Follow-Up:  Pharmacist Review Attempted to schedule appointment for November for patient. Patient's husband states they have appointment with Dr. Lorelei Pont on 07/24/20 and would like to wait until after physical before scheduling follow up pharmacist appointment.  [name], [facility] [place] Pharmacist Assistant [phone]   Last saw patient in May 2021. Goal to ensure no major medication changes occur at least every 6 months. Will see if patient is willing for follow up med/chronic care/health maintenance review after physical with Dr. Lorelei Pont. -FXOV  Reviewed by: De Blanch, PharmD Clinical Pharmacist [facility] [facility] [phone]

## 2020-07-15 DIAGNOSIS — R69 Illness, unspecified: Secondary | ICD-10-CM | POA: Diagnosis not present

## 2020-07-16 ENCOUNTER — Other Ambulatory Visit: Payer: Self-pay | Admitting: Family Medicine

## 2020-07-16 DIAGNOSIS — R7303 Prediabetes: Secondary | ICD-10-CM

## 2020-07-16 DIAGNOSIS — E034 Atrophy of thyroid (acquired): Secondary | ICD-10-CM

## 2020-07-16 DIAGNOSIS — I10 Essential (primary) hypertension: Secondary | ICD-10-CM

## 2020-07-16 DIAGNOSIS — E782 Mixed hyperlipidemia: Secondary | ICD-10-CM

## 2020-07-18 ENCOUNTER — Telehealth: Payer: Self-pay

## 2020-07-18 NOTE — Telephone Encounter (Signed)
No, she is all set-thank you

## 2020-07-18 NOTE — Telephone Encounter (Signed)
-----   Message from [name], MD sent at 07/16/2020  8:11 PM EDT ----- This pt also needs a lab appt to have her blood drawn, would like to come in with her husband Christine Keller if possible  Orders are in  Thank you!

## 2020-07-18 NOTE — Telephone Encounter (Signed)
I had already scheduled Christine Keller and Christine Keller for the same day on the 20th to see you. Did she need to come in sooner for lab work or can she do it that day as well?

## 2020-07-23 NOTE — Patient Instructions (Addendum)
It was good to see you again today!  I will be in touch with your labs as soon as possible  Please consider getting the Shingrix vaccine at your pharmacy at your convenience We ordered your bone density and mammogram today I think you are also due for a colon cancer screening at your convenience  Let's plan to visit in 6 months assuming all is well

## 2020-07-23 NOTE — Progress Notes (Addendum)
[facility] [place], [place], [place] [address] [phone]  Date:  07/24/2020   Name:  Christine Keller   DOB:  [birth year]   MRN:  [id number]  PCP:  [name], MD    Chief Complaint: Annual Exam (6 month follow up)   History of Present Illness:  Christine Keller is a 79 y.o. very pleasant female patient who presents with the following:  Patient today for periodic follow-up visit Last seen by myself in February- History of COPD, tobacco abuse, prediabetes, hypothyroidism, hyperlipidemia, hypertension That time she had stopped her amlodipine, blood pressure looks fine without this medication She is checking her BP at home- her SBP may be about 140 but her DBP is in the 60s  Here today with her husband Christine Keller are both getting older and slowing down, but they are grateful to be as active as they are.  They really enjoy spending time with her 72-year-old granddaughter who lives locally  COVID-19 vaccine- done  Flu shot- done  Colon cancer screening- she does not want to do this right now  Bone density scan- will do this  Mammogram-patient agreed to let me order this test Most recent labs in January  Levothyroxine Vitamin D Simvastatin Spiriva Stiolto Respimat Valsartan/HCTZ Patient Active Problem List   Diagnosis Date Noted  . Routine general medical examination at a health care facility 08/14/2015  . Hypothyroidism 12/05/2014  . Hyperlipidemia 08/08/2014  . Osteopenia 12/27/2013  . GERD (gastroesophageal reflux disease) 12/27/2013  . Guaiac positive stools 12/27/2013  . Ovarian tumor 05/16/2012  . Borderline diabetes 04/16/2011  . Preventative health care 04/15/2011  . Hyperglycemia 04/15/2011  . Essential hypertension 12/22/2010  . Carpal tunnel syndrome 12/22/2010  . COPD (chronic obstructive pulmonary disease) ([place] [facility]) 11/15/2009  . POLYCYTHEMIA 10/30/2009  . TOBACCO ABUSE 10/30/2009  . DYSPNEA  10/30/2009    Past Medical History:  Diagnosis Date  . Bilateral carpal tunnel syndrome   . COPD (chronic obstructive pulmonary disease) ([facility]) 1.31.11   PFT FEV1 1.19(56%), FVC 2.68(90%), FEV1% 44, TLC 5.80(115%), DLCO 52%  . Hyperlipidemia   . Hypertension   . Osteoporosis    OSTEOPENIA  . Ovarian tumor 2014   Testosterone secreting ovarain tumor  . Thyroid nodule   . Tobacco abuse     Past Surgical History:  Procedure Laterality Date  . CARPAL TUNNEL RELEASE  05/26/2012   Procedure: CARPAL TUNNEL RELEASE;  Surgeon: Cammie Sickle., MD;  Location: [facility];  Service: Orthopedics;  Laterality: Left;  Injection of Right thumb Carpal-Metacarpal joint also  . CARPAL TUNNEL RELEASE  08/16/2012   Procedure: CARPAL TUNNEL RELEASE;  Surgeon: Cammie Sickle., MD;  Location: [facility];  Service: Orthopedics;  Laterality: Right;  Inject Left Thumb Carpal Metacarpal Joint  . OTHER SURGICAL HISTORY  12/14/12   bilateral oopherectomy [facility]  . STERIOD INJECTION  08/16/2012   Procedure: STEROID INJECTION;  Surgeon: Cammie Sickle., MD;  Location: [facility];  Service: Orthopedics;  Laterality: Left;  Inject Left Thumb Carpal Metacarpal Joint  . TONSILLECTOMY    . ULNAR NERVE TRANSPOSITION  05/26/2012   Procedure: ULNAR NERVE DECOMPRESSION/TRANSPOSITION;  Surgeon: Cammie Sickle., MD;  Location: [facility];  Service: Orthopedics;  Laterality: Left;    Social History   Tobacco Use  . Smoking status: Current Every Day Smoker    Packs/day: 1.00  Years: 60.00    Pack years: 60.00    Types: Cigarettes  . Smokeless tobacco: Never Used  . Tobacco comment: Less than 1 ppd.  Vaping Use  . Vaping Use: Never used  Substance Use Topics  . Alcohol use: Yes    Alcohol/week: 7.0 standard drinks    Types: 7 Glasses of wine per week  . Drug use: No    Family History  Problem Relation Age of Onset  . Heart disease Mother    . Hypertension Mother   . Asthma Father   . Emphysema Father   . Heart disease Brother     Allergies  Allergen Reactions  . Prednisone Shortness Of Breath    Per pt. Report 08/03/18  . Codeine     REACTION: hallucinations  . Niacin     REACTION: rash    Medication list has been reviewed and updated.  Current Outpatient Medications on File Prior to Visit  Medication Sig Dispense Refill  . albuterol (VENTOLIN HFA) 108 (90 Base) MCG/ACT inhaler Inhale 2 puffs into the lungs every 6 (six) hours as needed for wheezing or shortness of breath. 18 g 5  . CALCIUM-MAGNESIUM-ZINC PO Take by mouth daily.    . Cholecalciferol (VITAMIN D3) 2000 UNITS capsule Take 2,000 Units by mouth daily.    . Coenzyme Q10 (COQ10) 200 MG CAPS Take 1 capsule by mouth daily.      . famotidine (PEPCID) 20 MG tablet Take 20 mg by mouth at bedtime.    Marland Kitchen levothyroxine (SYNTHROID) 50 MCG tablet Take 1 tablet (50 mcg total) by mouth daily. 90 tablet 3  . OMEGA 3 1000 MG CAPS Take 1 capsule by mouth 2 (two) times daily.      . Probiotic Product (PROBIOTIC DAILY PO) Take by mouth daily.    . simvastatin (ZOCOR) 20 MG tablet Take 0.5 tablets (10 mg total) by mouth daily. 45 tablet 3  . tiotropium (SPIRIVA HANDIHALER) 18 MCG inhalation capsule Place 1 capsule (18 mcg total) into inhaler and inhale daily. 180 capsule 1  . valsartan-hydrochlorothiazide (DIOVAN-HCT) 160-25 MG tablet Take 1 tablet by mouth daily. 90 tablet 3   No current facility-administered medications on file prior to visit.    Review of Systems:  As per HPI- otherwise negative. Wt Readings from Last 3 Encounters:  07/24/20 114 lb (51.7 kg)  11/22/19 113 lb 12.8 oz (51.6 kg)  08/30/18 122 lb (55.3 kg)  Weight is stable   Physical Examination: Vitals:   07/24/20 1020  BP: 112/70  Pulse: 74  Resp: 17  SpO2: 94%   Vitals:   07/24/20 1020  Weight: 114 lb (51.7 kg)  Height: 5\' 5"  (1.651 m)   Body mass index is 18.97 kg/m. Ideal Body  Weight: Weight in (lb) to have BMI = 25: 149.9  GEN: no acute distress.  Slim build, looks well and her normal self HEENT: Atraumatic, Normocephalic.  Ears and Nose: No external deformity. CV: RRR, No M/G/R. No JVD. No thrill. No extra heart sounds. PULM: CTA B, no wheezes, crackles, rhonchi. No retractions. No resp. distress. No accessory muscle use. ABD: S, NT, ND, +BS. No rebound. No HSM. EXTR: No c/c/e PSYCH: Normally interactive. Conversant.    Assessment and Plan: Essential hypertension - Plan: CBC, Comprehensive metabolic panel, valsartan-hydrochlorothiazide (DIOVAN-HCT) 160-25 MG tablet  Mixed hyperlipidemia - Plan: Lipid panel, simvastatin (ZOCOR) 10 MG tablet  Pre-diabetes - Plan: Hemoglobin A1c  Hypothyroidism due to acquired atrophy of thyroid - Plan: TSH  Chronic obstructive pulmonary disease, unspecified COPD type ([facility])  POLYCYTHEMIA  Vitamin D deficiency - Plan: VITAMIN D 25 Hydroxy (Vit-D Deficiency, Fractures)  Medication refill - Plan: valsartan-hydrochlorothiazide (DIOVAN-HCT) 160-25 MG tablet, simvastatin (ZOCOR) 10 MG tablet  Screening mammogram for breast cancer - Plan: MM 3D SCREEN BREAST BILATERAL  Estrogen deficiency - Plan: DG Bone Density  Follow-up visit today Blood pressure under good control Refill medications Ordered mammogram and bone density scan Will plan further follow- up pending labs.  This visit occurred during the SARS-CoV-2 public health emergency.  Safety protocols were in place, including screening questions prior to the visit, additional usage of staff PPE, and extensive cleaning of exam room while observing appropriate contact time as indicated for disinfecting solutions.    Signed Lamar Blinks, MD  Addendum 10/21, received her labs as below.  Message to patient  Ordered a PTH and calcium level to be done as a lab only in 2 to 3 months Results for orders placed or performed in visit on 07/24/20  CBC  Result Value Ref  Range   WBC 7.7 3.8 - 10.8 Thousand/uL   RBC 4.60 3.80 - 5.10 Million/uL   Hemoglobin 15.7 (H) 11.7 - 15.5 g/dL   HCT 44.0 35 - 45 %   MCV 95.7 80.0 - 100.0 fL   MCH 34.1 (H) 27.0 - 33.0 pg   MCHC 35.7 32.0 - 36.0 g/dL   RDW 12.5 11.0 - 15.0 %   Platelets 296 140 - 400 Thousand/uL   MPV 10.2 7.5 - 12.5 fL  Comprehensive metabolic panel  Result Value Ref Range   Glucose, Bld 107 (H) 65 - 99 mg/dL   BUN 15 7 - 25 mg/dL   Creat 0.83 0.60 - 0.88 mg/dL   BUN/Creatinine Ratio NOT APPLICABLE 6 - 22 (calc)   Sodium 142 135 - 146 mmol/L   Potassium 4.0 3.5 - 5.3 mmol/L   Chloride 103 98 - 110 mmol/L   CO2 28 20 - 32 mmol/L   Calcium 10.7 (H) 8.6 - 10.4 mg/dL   Total Protein 7.7 6.1 - 8.1 g/dL   Albumin 4.8 3.6 - 5.1 g/dL   Globulin 2.9 1.9 - 3.7 g/dL (calc)   AG Ratio 1.7 1.0 - 2.5 (calc)   Total Bilirubin 0.6 0.2 - 1.2 mg/dL   Alkaline phosphatase (APISO) 73 37 - 153 U/L   AST 26 10 - 35 U/L   ALT 20 6 - 29 U/L  Hemoglobin A1c  Result Value Ref Range   Hgb A1c MFr Bld 5.8 (H) <5.7 % of total Hgb   Mean Plasma Glucose 120 (calc)   eAG (mmol/L) 6.6 (calc)  Lipid panel  Result Value Ref Range   Cholesterol 201 (H) <200 mg/dL   HDL 69 > OR = 50 mg/dL   Triglycerides 187 (H) <150 mg/dL   LDL Cholesterol (Calc) 102 (H) mg/dL (calc)   Total CHOL/HDL Ratio 2.9 <5.0 (calc)   Non-HDL Cholesterol (Calc) 132 (H) <130 mg/dL (calc)  TSH  Result Value Ref Range   TSH 0.67 0.40 - 4.50 mIU/L  VITAMIN D 25 Hydroxy (Vit-D Deficiency, Fractures)  Result Value Ref Range   Vit D, 25-Hydroxy 42 30 - 100 ng/mL

## 2020-07-24 ENCOUNTER — Encounter: Payer: Self-pay | Admitting: Family Medicine

## 2020-07-24 ENCOUNTER — Ambulatory Visit (INDEPENDENT_AMBULATORY_CARE_PROVIDER_SITE_OTHER): Payer: Medicare HMO | Admitting: Family Medicine

## 2020-07-24 ENCOUNTER — Other Ambulatory Visit: Payer: Self-pay

## 2020-07-24 VITALS — BP 112/70 | HR 74 | Resp 17 | Ht 65.0 in | Wt 114.0 lb

## 2020-07-24 DIAGNOSIS — Z1231 Encounter for screening mammogram for malignant neoplasm of breast: Secondary | ICD-10-CM | POA: Diagnosis not present

## 2020-07-24 DIAGNOSIS — E782 Mixed hyperlipidemia: Secondary | ICD-10-CM

## 2020-07-24 DIAGNOSIS — E2839 Other primary ovarian failure: Secondary | ICD-10-CM | POA: Diagnosis not present

## 2020-07-24 DIAGNOSIS — R7303 Prediabetes: Secondary | ICD-10-CM

## 2020-07-24 DIAGNOSIS — I10 Essential (primary) hypertension: Secondary | ICD-10-CM | POA: Diagnosis not present

## 2020-07-24 DIAGNOSIS — Z760 Encounter for issue of repeat prescription: Secondary | ICD-10-CM | POA: Diagnosis not present

## 2020-07-24 DIAGNOSIS — E559 Vitamin D deficiency, unspecified: Secondary | ICD-10-CM

## 2020-07-24 DIAGNOSIS — J449 Chronic obstructive pulmonary disease, unspecified: Secondary | ICD-10-CM

## 2020-07-24 DIAGNOSIS — D751 Secondary polycythemia: Secondary | ICD-10-CM | POA: Diagnosis not present

## 2020-07-24 DIAGNOSIS — E034 Atrophy of thyroid (acquired): Secondary | ICD-10-CM

## 2020-07-24 LAB — CBC
HCT: 44 % (ref 35.0–45.0)
MPV: 10.2 fL (ref 7.5–12.5)
RBC: 4.6 10*6/uL (ref 3.80–5.10)

## 2020-07-24 MED ORDER — VALSARTAN-HYDROCHLOROTHIAZIDE 160-25 MG PO TABS: 1.0000 | ORAL_TABLET | Freq: Every day | ORAL | 3 refills | Status: DC

## 2020-07-24 MED ORDER — SIMVASTATIN 10 MG PO TABS: 10.0000 mg | ORAL_TABLET | Freq: Every day | ORAL | 3 refills | Status: DC

## 2020-07-25 ENCOUNTER — Encounter: Payer: Self-pay | Admitting: Family Medicine

## 2020-07-25 LAB — COMPREHENSIVE METABOLIC PANEL
AG Ratio: 1.7 (calc) (ref 1.0–2.5)
ALT: 20 U/L (ref 6–29)
AST: 26 U/L (ref 10–35)
Albumin: 4.8 g/dL (ref 3.6–5.1)
Alkaline phosphatase (APISO): 73 U/L (ref 37–153)
BUN: 15 mg/dL (ref 7–25)
CO2: 28 mmol/L (ref 20–32)
Calcium: 10.7 mg/dL — ABNORMAL HIGH (ref 8.6–10.4)
Chloride: 103 mmol/L (ref 98–110)
Creat: 0.83 mg/dL (ref 0.60–0.88)
Globulin: 2.9 g/dL (calc) (ref 1.9–3.7)
Glucose, Bld: 107 mg/dL — ABNORMAL HIGH (ref 65–99)
Potassium: 4 mmol/L (ref 3.5–5.3)
Sodium: 142 mmol/L (ref 135–146)
Total Bilirubin: 0.6 mg/dL (ref 0.2–1.2)
Total Protein: 7.7 g/dL (ref 6.1–8.1)

## 2020-07-25 LAB — LIPID PANEL
Cholesterol: 201 mg/dL — ABNORMAL HIGH (ref <200)
HDL: 69 mg/dL (ref >=50)
LDL Cholesterol (Calc): 102 mg/dL (calc) — ABNORMAL HIGH
Non-HDL Cholesterol (Calc): 132 mg/dL (calc) — ABNORMAL HIGH (ref <130)
Total CHOL/HDL Ratio: 2.9 (calc) (ref <5.0)
Triglycerides: 187 mg/dL — ABNORMAL HIGH (ref <150)

## 2020-07-25 LAB — CBC
Hemoglobin: 15.7 g/dL — ABNORMAL HIGH (ref 11.7–15.5)
MCH: 34.1 pg — ABNORMAL HIGH (ref 27.0–33.0)
MCHC: 35.7 g/dL (ref 32.0–36.0)
MCV: 95.7 fL (ref 80.0–100.0)
Platelets: 296 10*3/uL (ref 140–400)
RDW: 12.5 % (ref 11.0–15.0)
WBC: 7.7 10*3/uL (ref 3.8–10.8)

## 2020-07-25 LAB — HEMOGLOBIN A1C
Hgb A1c MFr Bld: 5.8 % of total Hgb — ABNORMAL HIGH (ref <5.7)
Mean Plasma Glucose: 120 (calc)
eAG (mmol/L): 6.6 (calc)

## 2020-07-25 LAB — VITAMIN D 25 HYDROXY (VIT D DEFICIENCY, FRACTURES): Vit D, 25-Hydroxy: 42 ng/mL (ref 30–100)

## 2020-07-25 LAB — TSH: TSH: 0.67 mIU/L (ref 0.40–4.50)

## 2020-07-25 NOTE — Addendum Note (Signed)
Addended by: Lamar Blinks C on: 07/25/2020 05:36 AM   Modules accepted: Orders

## 2020-07-26 ENCOUNTER — Other Ambulatory Visit: Payer: Self-pay | Admitting: Family Medicine

## 2020-07-26 DIAGNOSIS — I10 Essential (primary) hypertension: Secondary | ICD-10-CM

## 2020-07-26 DIAGNOSIS — Z760 Encounter for issue of repeat prescription: Secondary | ICD-10-CM

## 2020-07-26 DIAGNOSIS — E782 Mixed hyperlipidemia: Secondary | ICD-10-CM

## 2020-07-26 MED ORDER — SIMVASTATIN 10 MG PO TABS: 10.0000 mg | ORAL_TABLET | Freq: Every day | ORAL | 3 refills | Status: DC

## 2020-08-07 ENCOUNTER — Telehealth: Payer: Self-pay | Admitting: Pharmacist

## 2020-08-07 NOTE — Progress Notes (Signed)
     Chronic Care Management Pharmacy Assistant   Name: Christine Keller  MRN: [id number] DOB: July 09, 1941  Reason for Encounter: Medication Review   PCP : [name], MD  Allergies:   Allergies  Allergen Reactions  . Prednisone Shortness Of Breath    Per pt. Report 08/03/18  . Codeine     REACTION: hallucinations  . Niacin     REACTION: rash    Medications: Outpatient Encounter Medications as of 08/07/2020  Medication Sig  . albuterol (VENTOLIN HFA) 108 (90 Base) MCG/ACT inhaler Inhale 2 puffs into the lungs every 6 (six) hours as needed for wheezing or shortness of breath.  Marland Kitchen CALCIUM-MAGNESIUM-ZINC PO Take by mouth daily.  . Cholecalciferol (VITAMIN D3) 2000 UNITS capsule Take 2,000 Units by mouth daily.  . Coenzyme Q10 (COQ10) 200 MG CAPS Take 1 capsule by mouth daily.    . famotidine (PEPCID) 20 MG tablet Take 20 mg by mouth at bedtime.  Marland Kitchen levothyroxine (SYNTHROID) 50 MCG tablet Take 1 tablet (50 mcg total) by mouth daily.  . OMEGA 3 1000 MG CAPS Take 1 capsule by mouth 2 (two) times daily.    . Probiotic Product (PROBIOTIC DAILY PO) Take by mouth daily.  . simvastatin (ZOCOR) 10 MG tablet Take 1 tablet (10 mg total) by mouth daily.  Marland Kitchen tiotropium (SPIRIVA HANDIHALER) 18 MCG inhalation capsule Place 1 capsule (18 mcg total) into inhaler and inhale daily.  . valsartan-hydrochlorothiazide (DIOVAN-HCT) 160-25 MG tablet TAKE ONE TABLET BY MOUTH DAILY   No facility-administered encounter medications on file as of 08/07/2020.    Current Diagnosis: Patient Active Problem List   Diagnosis Date Noted  . Routine general medical examination at a health care facility 08/14/2015  . Hypothyroidism 12/05/2014  . Hyperlipidemia 08/08/2014  . Osteopenia 12/27/2013  . GERD (gastroesophageal reflux disease) 12/27/2013  . Guaiac positive stools 12/27/2013  . Ovarian tumor 05/16/2012  . Borderline diabetes 04/16/2011  . Preventative health care 04/15/2011  . Hyperglycemia  04/15/2011  . Essential hypertension 12/22/2010  . Carpal tunnel syndrome 12/22/2010  . COPD (chronic obstructive pulmonary disease) ([facility]) 11/15/2009  . POLYCYTHEMIA 10/30/2009  . TOBACCO ABUSE 10/30/2009  . DYSPNEA 10/30/2009    Goals Addressed   None     Chronic Care Management   Outreach Note  08/28/2020 Name: Christine Keller MRN: [id number] DOB: 04/18/1941  Referred by: [name], MD Reason for referral : Chronic Care Management   Third unsuccessful telephone outreach was attempted today. The patient was referred to the pharmacist for assistance with care management and care coordination.   Follow-Up:  Pharmacist Review   Christine Keller, [facility] Pharmacist Assistant [phone]

## 2020-10-24 ENCOUNTER — Other Ambulatory Visit: Payer: Self-pay | Admitting: Family Medicine

## 2020-10-24 DIAGNOSIS — E034 Atrophy of thyroid (acquired): Secondary | ICD-10-CM

## 2020-12-10 NOTE — Progress Notes (Addendum)
[facility] [place], [place], [place] [address] [phone]  Date:  12/12/2020   Name:  Christine Keller   DOB:  August 30, 1941   MRN:  [id number]  PCP:  [name], MD    Chief Complaint: Medical Management of Chronic Issues (6 m f/u )   History of Present Illness:  Christine Keller is a 80 y.o. very pleasant female patient who presents with the following:  Routine 6 month visit today-  History of COPD, tobacco abuse, prediabetes, hypothyroidism, hyperlipidemia, hypertension Last seen by myself in October  Her home BP is running 145- 150/ 70s   Colon cancer screen- declines for now  shingrix- reminded about this, she will think about it COVID series up-to-date mammo- pt declines for now dexa pt declines for now  Labs done in October  Declines lung cancer screening   Her husband Sonia Side was recently in the hospital with urosepsis.  This is pretty stressful for her as well!  Thankfully he is much better She has no particular concerns today, she is grateful to be as healthy and active as she is  Lab Results  Component Value Date   HGBA1C 5.8 (H) 07/24/2020    Lab Results  Component Value Date   TSH 0.67 07/24/2020    Simvastatin Valsartan HCTZ Levothyroxine Spiriva Albuterol as needed   Lung cancer screening CT Colon cancer screen Patient Active Problem List   Diagnosis Date Noted  . Routine general medical examination at a health care facility 08/14/2015  . Hypothyroidism 12/05/2014  . Hyperlipidemia 08/08/2014  . Osteopenia 12/27/2013  . GERD (gastroesophageal reflux disease) 12/27/2013  . Guaiac positive stools 12/27/2013  . Ovarian tumor 05/16/2012  . Borderline diabetes 04/16/2011  . Preventative health care 04/15/2011  . Hyperglycemia 04/15/2011  . Essential hypertension 12/22/2010  . Carpal tunnel syndrome 12/22/2010  . COPD (chronic obstructive pulmonary disease) ([facility]) 11/15/2009  .  POLYCYTHEMIA 10/30/2009  . TOBACCO ABUSE 10/30/2009  . DYSPNEA 10/30/2009    Past Medical History:  Diagnosis Date  . Bilateral carpal tunnel syndrome   . COPD (chronic obstructive pulmonary disease) ([facility]) 1.31.11   PFT FEV1 1.19(56%), FVC 2.68(90%), FEV1% 44, TLC 5.80(115%), DLCO 52%  . Hyperlipidemia   . Hypertension   . Osteoporosis    OSTEOPENIA  . Ovarian tumor 2014   Testosterone secreting ovarain tumor  . Thyroid nodule   . Tobacco abuse     Past Surgical History:  Procedure Laterality Date  . CARPAL TUNNEL RELEASE  05/26/2012   Procedure: CARPAL TUNNEL RELEASE;  Surgeon: Cammie Sickle., MD;  Location: [facility];  Service: Orthopedics;  Laterality: Left;  Injection of Right thumb Carpal-Metacarpal joint also  . CARPAL TUNNEL RELEASE  08/16/2012   Procedure: CARPAL TUNNEL RELEASE;  Surgeon: Cammie Sickle., MD;  Location: [facility];  Service: Orthopedics;  Laterality: Right;  Inject Left Thumb Carpal Metacarpal Joint  . OTHER SURGICAL HISTORY  12/14/12   bilateral oopherectomy [facility]  . STERIOD INJECTION  08/16/2012   Procedure: STEROID INJECTION;  Surgeon: Cammie Sickle., MD;  Location: [facility];  Service: Orthopedics;  Laterality: Left;  Inject Left Thumb Carpal Metacarpal Joint  . TONSILLECTOMY    . ULNAR NERVE TRANSPOSITION  05/26/2012   Procedure: ULNAR NERVE DECOMPRESSION/TRANSPOSITION;  Surgeon: Cammie Sickle., MD;  Location: [facility];  Service: Orthopedics;  Laterality: Left;  Social History   Tobacco Use  . Smoking status: Current Every Day Smoker    Packs/day: 1.00    Years: 60.00    Pack years: 60.00    Types: Cigarettes  . Smokeless tobacco: Never Used  . Tobacco comment: Less than 1 ppd.  Vaping Use  . Vaping Use: Never used  Substance Use Topics  . Alcohol use: Yes    Alcohol/week: 7.0 standard drinks    Types: 7 Glasses of wine per week  . Drug use: No    Family  History  Problem Relation Age of Onset  . Heart disease Mother   . Hypertension Mother   . Asthma Father   . Emphysema Father   . Heart disease Brother     Allergies  Allergen Reactions  . Prednisone Shortness Of Breath    Per pt. Report 08/03/18  . Codeine     REACTION: hallucinations  . Niacin     REACTION: rash    Medication list has been reviewed and updated.  Current Outpatient Medications on File Prior to Visit  Medication Sig Dispense Refill  . albuterol (VENTOLIN HFA) 108 (90 Base) MCG/ACT inhaler Inhale 2 puffs into the lungs every 6 (six) hours as needed for wheezing or shortness of breath. 18 g 5  . CALCIUM-MAGNESIUM-ZINC PO Take by mouth daily.    . Cholecalciferol (VITAMIN D3) 2000 UNITS capsule Take 2,000 Units by mouth daily.    . Coenzyme Q10 (COQ10) 200 MG CAPS Take 1 capsule by mouth daily.    . famotidine (PEPCID) 20 MG tablet Take 20 mg by mouth at bedtime.    Marland Kitchen levothyroxine (SYNTHROID) 50 MCG tablet Take 1 tablet (50 mcg total) by mouth daily before breakfast. 90 tablet 1  . OMEGA 3 1000 MG CAPS Take 1 capsule by mouth 2 (two) times daily.    . Probiotic Product (PROBIOTIC DAILY PO) Take by mouth daily.    . simvastatin (ZOCOR) 10 MG tablet Take 1 tablet (10 mg total) by mouth daily. 90 tablet 3  . tiotropium (SPIRIVA HANDIHALER) 18 MCG inhalation capsule Place 1 capsule (18 mcg total) into inhaler and inhale daily. 180 capsule 1  . valsartan-hydrochlorothiazide (DIOVAN-HCT) 160-25 MG tablet TAKE ONE TABLET BY MOUTH DAILY 90 tablet 3   No current facility-administered medications on file prior to visit.    Review of Systems:  As per HPI- otherwise negative.   Physical Examination: Vitals:   12/12/20 1305  BP: (!) 150/80  Pulse: 84  Temp: 98.4 F (36.9 C)  SpO2: 98%   Vitals:   12/12/20 1305  Weight: 115 lb 12.8 oz (52.5 kg)  Height: 5\' 5"  (1.651 m)   Body mass index is 19.27 kg/m. Ideal Body Weight: Weight in (lb) to have BMI = 25:  149.9  GEN: no acute distress.  Normal weight, looks well HEENT: Atraumatic, Normocephalic.  Ears and Nose: No external deformity. CV: RRR, No M/G/R. No JVD. No thrill. No extra heart sounds. PULM: CTA B, no wheezes, crackles, rhonchi. No retractions. No resp. distress. No accessory muscle use. ABD: S, NT, ND, +BS. No rebound. No HSM. EXTR: No c/c/e PSYCH: Normally interactive. Conversant.    Assessment and Plan: Essential hypertension - Plan: Basic metabolic panel  Mixed hyperlipidemia  Pre-diabetes - Plan: Hemoglobin A1c  Hypothyroidism due to acquired atrophy of thyroid - Plan: TSH  Chronic obstructive pulmonary disease, unspecified COPD type ([place])  Polycythemia, secondary - Plan: CBC  Here today for routine follow-up.  Blood pressure  is acceptable, continue current medication We will check her A1c, thyroid today COPD symptoms are stable Patient continues to smoke, knows that she should quit We discussed health maintenance and screening Follow-up 6 months This visit occurred during the SARS-CoV-2 public health emergency.  Safety protocols were in place, including screening questions prior to the visit, additional usage of staff PPE, and extensive cleaning of exam room while observing appropriate contact time as indicated for disinfecting solutions.    Signed Lamar Blinks, MD  Received labs as below, 3/11.  Message to patient  Results for orders placed or performed in visit on 97/67/34  Basic metabolic panel  Result Value Ref Range   Sodium 140 135 - 145 mEq/L   Potassium 3.7 3.5 - 5.1 mEq/L   Chloride 101 96 - 112 mEq/L   CO2 29 19 - 32 mEq/L   Glucose, Bld 96 70 - 99 mg/dL   BUN 11 6 - 23 mg/dL   Creatinine, Ser 0.87 0.40 - 1.20 mg/dL   GFR 62.82 >60.00 mL/min   Calcium 10.4 8.4 - 10.5 mg/dL  Hemoglobin A1c  Result Value Ref Range   Hgb A1c MFr Bld 6.0 4.6 - 6.5 %  TSH  Result Value Ref Range   TSH 0.70 0.35 - 4.50 uIU/mL  CBC  Result Value Ref Range    WBC 8.8 4.0 - 10.5 K/uL   RBC 4.32 3.87 - 5.11 Mil/uL   Platelets 293.0 150.0 - 400.0 K/uL   Hemoglobin 14.7 12.0 - 15.0 g/dL   HCT 42.3 36.0 - 46.0 %   MCV 98.0 78.0 - 100.0 fl   MCHC 34.7 30.0 - 36.0 g/dL   RDW 13.0 11.5 - 15.5 %

## 2020-12-10 NOTE — Patient Instructions (Addendum)
Great to see you again today!  I will be in touch with your labs as soon as possible It does look like you are due for colon cancer screening, mammo and bone density.  We are also glad to set up a lung cancer screening CT at any time.  Just let me know if you would like any of these services Please see me in 6 months assuming all is well

## 2020-12-12 ENCOUNTER — Other Ambulatory Visit: Payer: Self-pay

## 2020-12-12 ENCOUNTER — Encounter: Payer: Self-pay | Admitting: Family Medicine

## 2020-12-12 ENCOUNTER — Ambulatory Visit (INDEPENDENT_AMBULATORY_CARE_PROVIDER_SITE_OTHER): Payer: Medicare HMO | Admitting: Family Medicine

## 2020-12-12 VITALS — BP 140/70 | HR 84 | Temp 98.4°F | Ht 65.0 in | Wt 115.8 lb

## 2020-12-12 DIAGNOSIS — E034 Atrophy of thyroid (acquired): Secondary | ICD-10-CM | POA: Diagnosis not present

## 2020-12-12 DIAGNOSIS — E782 Mixed hyperlipidemia: Secondary | ICD-10-CM | POA: Diagnosis not present

## 2020-12-12 DIAGNOSIS — J449 Chronic obstructive pulmonary disease, unspecified: Secondary | ICD-10-CM

## 2020-12-12 DIAGNOSIS — I10 Essential (primary) hypertension: Secondary | ICD-10-CM | POA: Diagnosis not present

## 2020-12-12 DIAGNOSIS — R7303 Prediabetes: Secondary | ICD-10-CM | POA: Diagnosis not present

## 2020-12-12 DIAGNOSIS — D751 Secondary polycythemia: Secondary | ICD-10-CM

## 2020-12-13 ENCOUNTER — Encounter: Payer: Self-pay | Admitting: Family Medicine

## 2020-12-13 LAB — BASIC METABOLIC PANEL
BUN: 11 mg/dL (ref 6–23)
CO2: 29 mEq/L (ref 19–32)
Calcium: 10.4 mg/dL (ref 8.4–10.5)
Chloride: 101 mEq/L (ref 96–112)
Creatinine, Ser: 0.87 mg/dL (ref 0.40–1.20)
GFR: 62.82 mL/min (ref >60.00)
Glucose, Bld: 96 mg/dL (ref 70–99)
Potassium: 3.7 mEq/L (ref 3.5–5.1)
Sodium: 140 mEq/L (ref 135–145)

## 2020-12-13 LAB — CBC
HCT: 42.3 % (ref 36.0–46.0)
Hemoglobin: 14.7 g/dL (ref 12.0–15.0)
MCHC: 34.7 g/dL (ref 30.0–36.0)
MCV: 98 fl (ref 78.0–100.0)
Platelets: 293 10*3/uL (ref 150.0–400.0)
RBC: 4.32 Mil/uL (ref 3.87–5.11)
RDW: 13 % (ref 11.5–15.5)
WBC: 8.8 10*3/uL (ref 4.0–10.5)

## 2020-12-13 LAB — TSH: TSH: 0.7 u[IU]/mL (ref 0.35–4.50)

## 2020-12-13 LAB — HEMOGLOBIN A1C: Hgb A1c MFr Bld: 6 % (ref 4.6–6.5)

## 2021-04-16 ENCOUNTER — Other Ambulatory Visit: Payer: Self-pay | Admitting: Family Medicine

## 2021-04-16 DIAGNOSIS — E034 Atrophy of thyroid (acquired): Secondary | ICD-10-CM

## 2021-04-16 DIAGNOSIS — J449 Chronic obstructive pulmonary disease, unspecified: Secondary | ICD-10-CM

## 2021-04-17 ENCOUNTER — Encounter: Payer: Self-pay | Admitting: Family Medicine

## 2021-04-18 MED ORDER — IPRATROPIUM-ALBUTEROL 0.5-2.5 (3) MG/3ML IN SOLN: 3.0000 mL | Freq: Four times a day (QID) | RESPIRATORY_TRACT | 3 refills | Status: DC | PRN

## 2021-06-07 ENCOUNTER — Ambulatory Visit: Payer: Medicare HMO

## 2021-06-08 ENCOUNTER — Ambulatory Visit: Payer: Medicare HMO

## 2021-06-13 NOTE — Progress Notes (Addendum)
[facility], [place], [place] [address] [phone]  Date:  06/16/2021   Name:  Christine Keller   DOB:  October 22, 1940   MRN:  [id number]  PCP:  [name], MD    Chief Complaint: Hypertension (6 month follow up/), Hyperlipidemia, and Hypothyroidism   History of Present Illness:  Christine Keller is a 80 y.o. very pleasant female patient who presents with the following:  Pt seen today for a 6 month follow-up visit Last seen by myself in March of this year  History of COPD, tobacco abuse, prediabetes, hypothyroidism, hyperlipidemia, hypertension  Lab Results  Component Value Date   HGBA1C 6.0 12/12/2020   Shingrix: 05/15/21 first dose complete  Colon screening: she is overdue but does not wish to continue screening at this point in her life Declines lung cancer screening CT  Covid booster- did one 01/28/21 Flu vaccine- will give today   Overall Christine Keller is doing well.  No particular concerns today.  Their granddaughter 3-1/2 years old and continues to be a big source of joy for her  BP Readings from Last 3 Encounters:  06/16/21 (!) 142/82  12/12/20 140/70  07/24/20 112/70     Patient Active Problem List   Diagnosis Date Noted   Routine general medical examination at a health care facility 08/14/2015   Hypothyroidism 12/05/2014   Hyperlipidemia 08/08/2014   Osteopenia 12/27/2013   GERD (gastroesophageal reflux disease) 12/27/2013   Guaiac positive stools 12/27/2013   Ovarian tumor 05/16/2012   Borderline diabetes 04/16/2011   Preventative health care 04/15/2011   Hyperglycemia 04/15/2011   Essential hypertension 12/22/2010   Carpal tunnel syndrome 12/22/2010   COPD (chronic obstructive pulmonary disease) ([place]) 11/15/2009   POLYCYTHEMIA 10/30/2009   TOBACCO ABUSE 10/30/2009   DYSPNEA 10/30/2009    Past Medical History:  Diagnosis Date   Bilateral carpal tunnel syndrome    COPD (chronic  obstructive pulmonary disease) ([place]) 1.31.11   PFT FEV1 1.19(56%), FVC 2.68(90%), FEV1% 44, TLC 5.80(115%), DLCO 52%   Hyperlipidemia    Hypertension    Osteoporosis    OSTEOPENIA   Ovarian tumor 2014   Testosterone secreting ovarain tumor   Thyroid nodule    Tobacco abuse     Past Surgical History:  Procedure Laterality Date   CARPAL TUNNEL RELEASE  05/26/2012   Procedure: CARPAL TUNNEL RELEASE;  Surgeon: Cammie Sickle., MD;  Location: [facility];  Service: Orthopedics;  Laterality: Left;  Injection of Right thumb Carpal-Metacarpal joint also   CARPAL TUNNEL RELEASE  08/16/2012   Procedure: CARPAL TUNNEL RELEASE;  Surgeon: Cammie Sickle., MD;  Location: [facility];  Service: Orthopedics;  Laterality: Right;  Inject Left Thumb Carpal Metacarpal Joint   OTHER SURGICAL HISTORY  12/14/12   bilateral oopherectomy [facility]   STERIOD INJECTION  08/16/2012   Procedure: STEROID INJECTION;  Surgeon: Cammie Sickle., MD;  Location: [facility];  Service: Orthopedics;  Laterality: Left;  Inject Left Thumb Carpal Metacarpal Joint   TONSILLECTOMY     ULNAR NERVE TRANSPOSITION  05/26/2012   Procedure: ULNAR NERVE DECOMPRESSION/TRANSPOSITION;  Surgeon: Cammie Sickle., MD;  Location: [facility];  Service: Orthopedics;  Laterality: Left;    Social History   Tobacco Use   Smoking status: Every Day    Packs/day: 1.00    Years: 60.00    Pack years: 60.00  Types: Cigarettes   Smokeless tobacco: Never   Tobacco comments:    Less than 1 ppd.  Vaping Use   Vaping Use: Never used  Substance Use Topics   Alcohol use: Yes    Alcohol/week: 7.0 standard drinks    Types: 7 Glasses of wine per week   Drug use: No    Family History  Problem Relation Age of Onset   Heart disease Mother    Hypertension Mother    Asthma Father    Emphysema Father    Heart disease Brother     Allergies  Allergen Reactions   Prednisone  Shortness Of Breath    Per pt. Report 08/03/18   Codeine     REACTION: hallucinations   Niacin     REACTION: rash    Medication list has been reviewed and updated.  Current Outpatient Medications on File Prior to Visit  Medication Sig Dispense Refill   albuterol (VENTOLIN HFA) 108 (90 Base) MCG/ACT inhaler Inhale 2 puffs into the lungs every 6 (six) hours as needed for wheezing or shortness of breath. 18 g 5   CALCIUM-MAGNESIUM-ZINC PO Take by mouth daily.     Cholecalciferol (VITAMIN D3) 2000 UNITS capsule Take 2,000 Units by mouth daily.     Coenzyme Q10 (COQ10) 200 MG CAPS Take 1 capsule by mouth daily.     famotidine (PEPCID) 20 MG tablet Take 20 mg by mouth at bedtime.     ipratropium-albuterol (DUONEB) 0.5-2.5 (3) MG/3ML SOLN Take 3 mLs by nebulization every 6 (six) hours as needed. 360 mL 3   OMEGA 3 1000 MG CAPS Take 1 capsule by mouth 2 (two) times daily.     Probiotic Product (PROBIOTIC DAILY PO) Take by mouth daily.     simvastatin (ZOCOR) 10 MG tablet Take 1 tablet (10 mg total) by mouth daily. 90 tablet 3   tiotropium (SPIRIVA HANDIHALER) 18 MCG inhalation capsule Place 1 capsule (18 mcg total) into inhaler and inhale daily. 180 capsule 1   No current facility-administered medications on file prior to visit.    Review of Systems:  As per HPI- otherwise negative.   Physical Examination: Vitals:   06/16/21 1352  BP: (!) 142/82  Pulse: 87  Resp: 18  Temp: 97.6 F (36.4 C)  SpO2: 95%   Vitals:   06/16/21 1352  Weight: 116 lb (52.6 kg)  Height: '5\' 5"'$  (1.651 m)   Body mass index is 19.3 kg/m. Ideal Body Weight: Weight in (lb) to have BMI = 25: 149.9  GEN: no acute distress.  Looks well, petite build HEENT: Atraumatic, Normocephalic.  Ears and Nose: No external deformity. CV: RRR, No M/G/R. No JVD. No thrill. No extra heart sounds. PULM: CTA B, no wheezes, crackles, rhonchi. No retractions. No resp. distress. No accessory muscle use. ABD: S, NT, ND. No  rebound. No HSM. EXTR: No c/c/e PSYCH: Normally interactive. Conversant.   Assessment and Plan: Essential hypertension - Plan: Comprehensive metabolic panel, valsartan-hydrochlorothiazide (DIOVAN-HCT) 160-25 MG tablet  Mixed hyperlipidemia - Plan: Lipid panel  Pre-diabetes - Plan: Hemoglobin A1c  Hypothyroidism due to acquired atrophy of thyroid - Plan: TSH, levothyroxine (SYNTHROID) 50 MCG tablet  Medication refill - Plan: valsartan-hydrochlorothiazide (DIOVAN-HCT) 160-25 MG tablet  Immunization due - Plan: Flu vaccine HIGH DOSE PF (Fluzone High dose)  Following up today Gave flu shot, discussed COVID-19 booster Blood pressure under reasonable control, patient's pressure does tend to come up in the office.  Refill medications Will plan further follow- up pending  labs. She declines colon or lung cancer screening at this time  This visit occurred during the SARS-CoV-2 public health emergency.  Safety protocols were in place, including screening questions prior to the visit, additional usage of staff PPE, and extensive cleaning of exam room while observing appropriate contact time as indicated for disinfecting solutions.   Signed Lamar Blinks, MD  Received her labs 9/13- message to pt  Results for orders placed or performed in visit on 06/16/21  Hemoglobin A1c  Result Value Ref Range   Hgb A1c MFr Bld 6.2 4.6 - 6.5 %  Comprehensive metabolic panel  Result Value Ref Range   Sodium 141 135 - 145 mEq/L   Potassium 3.9 3.5 - 5.1 mEq/L   Chloride 100 96 - 112 mEq/L   CO2 30 19 - 32 mEq/L   Glucose, Bld 101 (H) 70 - 99 mg/dL   BUN 17 6 - 23 mg/dL   Creatinine, Ser 0.78 0.40 - 1.20 mg/dL   Total Bilirubin 0.7 0.2 - 1.2 mg/dL   Alkaline Phosphatase 66 39 - 117 U/L   AST 22 0 - 37 U/L   ALT 21 0 - 35 U/L   Total Protein 7.1 6.0 - 8.3 g/dL   Albumin 4.4 3.5 - 5.2 g/dL   GFR 71.36 >60.00 mL/min   Calcium 10.6 (H) 8.4 - 10.5 mg/dL  Lipid panel  Result Value Ref Range    Cholesterol 203 (H) 0 - 200 mg/dL   Triglycerides 165.0 (H) 0.0 - 149.0 mg/dL   HDL 65.10 >39.00 mg/dL   VLDL 33.0 0.0 - 40.0 mg/dL   LDL Cholesterol 105 (H) 0 - 99 mg/dL   Total CHOL/HDL Ratio 3    NonHDL 137.99   TSH  Result Value Ref Range   TSH 0.83 0.35 - 5.50 uIU/mL

## 2021-06-14 ENCOUNTER — Encounter: Payer: Self-pay | Admitting: Family Medicine

## 2021-06-16 ENCOUNTER — Encounter: Payer: Self-pay | Admitting: Family Medicine

## 2021-06-16 ENCOUNTER — Ambulatory Visit (INDEPENDENT_AMBULATORY_CARE_PROVIDER_SITE_OTHER): Payer: Medicare HMO | Admitting: Family Medicine

## 2021-06-16 ENCOUNTER — Other Ambulatory Visit: Payer: Self-pay

## 2021-06-16 VITALS — BP 142/82 | HR 87 | Temp 97.6°F | Resp 18 | Ht 65.0 in | Wt 116.0 lb

## 2021-06-16 DIAGNOSIS — Z760 Encounter for issue of repeat prescription: Secondary | ICD-10-CM | POA: Diagnosis not present

## 2021-06-16 DIAGNOSIS — I10 Essential (primary) hypertension: Secondary | ICD-10-CM | POA: Diagnosis not present

## 2021-06-16 DIAGNOSIS — R7303 Prediabetes: Secondary | ICD-10-CM | POA: Diagnosis not present

## 2021-06-16 DIAGNOSIS — Z23 Encounter for immunization: Secondary | ICD-10-CM | POA: Diagnosis not present

## 2021-06-16 DIAGNOSIS — E782 Mixed hyperlipidemia: Secondary | ICD-10-CM | POA: Diagnosis not present

## 2021-06-16 DIAGNOSIS — E034 Atrophy of thyroid (acquired): Secondary | ICD-10-CM | POA: Diagnosis not present

## 2021-06-16 MED ORDER — VALSARTAN-HYDROCHLOROTHIAZIDE 160-25 MG PO TABS: 1.0000 | ORAL_TABLET | Freq: Every day | ORAL | 3 refills | Status: DC

## 2021-06-16 MED ORDER — LEVOTHYROXINE SODIUM 50 MCG PO TABS: 50.0000 ug | ORAL_TABLET | Freq: Every day | ORAL | 0 refills | Status: DC

## 2021-06-16 NOTE — Patient Instructions (Signed)
It was good to see you again today!  Flu shot today New covid booster the next couple of weeks Shingles by the end of the year  Please see me in 6 months assuming all is well

## 2021-06-17 ENCOUNTER — Encounter: Payer: Self-pay | Admitting: Family Medicine

## 2021-06-17 LAB — COMPREHENSIVE METABOLIC PANEL
ALT: 21 U/L (ref 0–35)
AST: 22 U/L (ref 0–37)
Albumin: 4.4 g/dL (ref 3.5–5.2)
Alkaline Phosphatase: 66 U/L (ref 39–117)
BUN: 17 mg/dL (ref 6–23)
CO2: 30 mEq/L (ref 19–32)
Calcium: 10.6 mg/dL — ABNORMAL HIGH (ref 8.4–10.5)
Chloride: 100 mEq/L (ref 96–112)
Creatinine, Ser: 0.78 mg/dL (ref 0.40–1.20)
GFR: 71.36 mL/min (ref >60.00)
Glucose, Bld: 101 mg/dL — ABNORMAL HIGH (ref 70–99)
Potassium: 3.9 mEq/L (ref 3.5–5.1)
Sodium: 141 mEq/L (ref 135–145)
Total Bilirubin: 0.7 mg/dL (ref 0.2–1.2)
Total Protein: 7.1 g/dL (ref 6.0–8.3)

## 2021-06-17 LAB — HEMOGLOBIN A1C: Hgb A1c MFr Bld: 6.2 % (ref 4.6–6.5)

## 2021-06-17 LAB — LIPID PANEL
Cholesterol: 203 mg/dL — ABNORMAL HIGH (ref 0–200)
HDL: 65.1 mg/dL (ref >39.00)
LDL Cholesterol: 105 mg/dL — ABNORMAL HIGH (ref 0–99)
NonHDL: 137.99
Total CHOL/HDL Ratio: 3
Triglycerides: 165 mg/dL — ABNORMAL HIGH (ref 0.0–149.0)
VLDL: 33 mg/dL (ref 0.0–40.0)

## 2021-06-17 LAB — TSH: TSH: 0.83 u[IU]/mL (ref 0.35–5.50)

## 2021-06-17 NOTE — Addendum Note (Signed)
Addended by: Lamar Blinks C on: 06/17/2021 07:20 PM   Modules accepted: Orders

## 2021-07-09 DIAGNOSIS — H353131 Nonexudative age-related macular degeneration, bilateral, early dry stage: Secondary | ICD-10-CM | POA: Diagnosis not present

## 2021-07-09 DIAGNOSIS — Z961 Presence of intraocular lens: Secondary | ICD-10-CM | POA: Diagnosis not present

## 2021-07-10 ENCOUNTER — Ambulatory Visit: Payer: Medicare HMO | Attending: Internal Medicine

## 2021-07-10 DIAGNOSIS — Z23 Encounter for immunization: Secondary | ICD-10-CM

## 2021-07-10 NOTE — Progress Notes (Signed)
   Covid-19 Vaccination Clinic  Name:  Christine Keller    MRN: [id number] DOB: July 18, 1941  07/10/2021  Christine Keller was observed post Covid-19 immunization for 15 minutes without incident. She was provided with Vaccine Information Sheet and instruction to access the V-Safe system.   Christine Keller was instructed to call 911 with any severe reactions post vaccine: Difficulty breathing  Swelling of face and throat  A fast heartbeat  A bad rash all over body  Dizziness and weakness

## 2021-07-18 ENCOUNTER — Other Ambulatory Visit (HOSPITAL_BASED_OUTPATIENT_CLINIC_OR_DEPARTMENT_OTHER): Payer: Self-pay

## 2021-07-18 MED ORDER — COVID-19MRNA BIVAL VACC PFIZER 30 MCG/0.3ML IM SUSP
INTRAMUSCULAR | 0 refills | Status: DC
  Filled 2021-07-18: qty 0.3, 1d supply, fill #0

## 2021-07-28 ENCOUNTER — Encounter: Payer: Self-pay | Admitting: Family Medicine

## 2021-07-30 ENCOUNTER — Other Ambulatory Visit (INDEPENDENT_AMBULATORY_CARE_PROVIDER_SITE_OTHER): Payer: Medicare HMO

## 2021-07-30 ENCOUNTER — Other Ambulatory Visit: Payer: Self-pay

## 2021-07-31 LAB — PTH, INTACT AND CALCIUM
Calcium: 10.6 mg/dL — ABNORMAL HIGH (ref 8.6–10.4)
PTH: 37 pg/mL (ref 16–77)

## 2021-08-01 ENCOUNTER — Encounter: Payer: Self-pay | Admitting: Family Medicine

## 2021-08-02 ENCOUNTER — Other Ambulatory Visit: Payer: Self-pay | Admitting: Family Medicine

## 2021-09-04 ENCOUNTER — Encounter: Payer: Self-pay | Admitting: Family Medicine

## 2021-09-04 DIAGNOSIS — J441 Chronic obstructive pulmonary disease with (acute) exacerbation: Secondary | ICD-10-CM

## 2021-09-04 MED ORDER — DOXYCYCLINE HYCLATE 100 MG PO TABS: 100.0000 mg | ORAL_TABLET | Freq: Two times a day (BID) | ORAL | 0 refills | Status: DC

## 2021-09-04 NOTE — Telephone Encounter (Signed)
Patient's husband called to get an update on the message he sent earlier, there are no appointments available today or tomorrow in our office. Could possibly do a VV in another office for tomorrow if needed. Please advice.

## 2021-09-04 NOTE — Telephone Encounter (Signed)
Called back and spoke with Berneta Sages- it sounds like Cherylene has a COPD exacerbation She has a cough like several people in her family have had recently.  Berneta Sages does not feel like she is any distress or like she needs the ER  Will start doxycycline for her  She is using her duoneb once a day Recommended that she use this up to three times a day She is down to 2 cigs a day She cannot tolerate prednisone at all so will not use this   I asked them to contact me if she is not improving

## 2021-09-09 ENCOUNTER — Ambulatory Visit: Payer: Medicare HMO | Admitting: Pulmonary Disease

## 2021-09-09 ENCOUNTER — Other Ambulatory Visit: Payer: Self-pay

## 2021-09-09 ENCOUNTER — Encounter: Payer: Self-pay | Admitting: Pulmonary Disease

## 2021-09-09 VITALS — BP 136/82 | HR 88 | Ht 65.0 in | Wt 111.0 lb

## 2021-09-09 DIAGNOSIS — J449 Chronic obstructive pulmonary disease, unspecified: Secondary | ICD-10-CM

## 2021-09-09 DIAGNOSIS — R0982 Postnasal drip: Secondary | ICD-10-CM | POA: Diagnosis not present

## 2021-09-09 MED ORDER — TRELEGY ELLIPTA 100-62.5-25 MCG/ACT IN AEPB: 1.0000 | INHALATION_SPRAY | Freq: Every day | RESPIRATORY_TRACT | 6 refills | Status: DC

## 2021-09-09 MED ORDER — FLUTICASONE PROPIONATE 50 MCG/ACT NA SUSP: 1.0000 | Freq: Every day | NASAL | 2 refills | Status: DC

## 2021-09-09 MED ORDER — PREDNISONE 10 MG PO TABS: 20.0000 mg | ORAL_TABLET | Freq: Every day | ORAL | 0 refills | Status: AC

## 2021-09-09 NOTE — Patient Instructions (Addendum)
Start Trelegy Ellipta 1 puff daily - rinse mouth out after each use  If the trelegy is not covered by insurance please let our office know.  Use duoneb nebulizer treatment every 4-6 hours as needed for cough, shortness of breath, wheezing or chest tightness  Stop using Spiriva inhaler if you start the trelegy  Start prednisone 20mg  daily for 5 days to help with sinus congestion and shortness of breath  Start fluticasone nasal spray, 1 spray per nostril daily  We will check your oxygen levels today when walking and schedule you for nocturnal oximetry testing.  Follow up in 3 months

## 2021-09-09 NOTE — Progress Notes (Signed)
Synopsis: Referred in December 2022 for COPD by Lamar Blinks, MD  Subjective:   PATIENT ID: Christine Keller GENDER: female DOB: 28-Dec-1940, MRN: [id number]  HPI  Chief Complaint  Patient presents with   Consult    Former [facility] patient for COPD. States her breathing had been stable until a month ago. Increased SOB with exertion.     Christine Keller is an 80 year old woman, daily smoker with GERD and hypertension who is referred to pulmonary clinic for evaluation of COPD.   She reports increasing shortness of breath over recent weeks after suffering a upper respiratory viral infection which she contracted from her grandchild.  She is having increased sinus congestion and postnasal drainage.  She has been having progressive shortness of breath over recent years and reports having dyspnea when walking from room to room in her home.  She denies any trouble with wheezing.  Prior to the respiratory viral infection she denied any issues with cough or sputum production.  She is currently using Spiriva HandiHaler daily.  She is also using DuoNeb nebulizer treatments as needed.  She is a daily smoker smoking about 3 cigarettes/day.  She was previously smoking 1 pack/day and has been smoking for over 60 years.  Spirometry from 2019 shows severe obstructive defect.  Past Medical History:  Diagnosis Date   Bilateral carpal tunnel syndrome    COPD (chronic obstructive pulmonary disease) ([place]) 1.31.11   PFT FEV1 1.19(56%), FVC 2.68(90%), FEV1% 44, TLC 5.80(115%), DLCO 52%   Hyperlipidemia    Hypertension    Osteoporosis    OSTEOPENIA   Ovarian tumor 2014   Testosterone secreting ovarain tumor   Thyroid nodule    Tobacco abuse      Family History  Problem Relation Age of Onset   Heart disease Mother    Hypertension Mother    Asthma Father    Emphysema Father    Heart disease Brother      Social History   Socioeconomic History   Marital status: Married    Spouse name: Not on file    Number of children: Not on file   Years of education: Not on file   Highest education level: Not on file  Occupational History   Occupation: Retired    Fish farm manager: RETIRED    Comment: Network engineer  Tobacco Use   Smoking status: Every Day    Packs/day: 1.00    Years: 60.00    Pack years: 60.00    Types: Cigarettes   Smokeless tobacco: Never   Tobacco comments:    Less than 1 ppd.  Vaping Use   Vaping Use: Never used  Substance and Sexual Activity   Alcohol use: Yes    Alcohol/week: 7.0 standard drinks    Types: 7 Glasses of wine per week   Drug use: No   Sexual activity: Not on file  Other Topics Concern   Not on file  Social History Narrative   Regular exercise:  3 x weekly   Caffeine Use:  occasional   Social Determinants of Health   Financial Resource Strain: Not on file  Food Insecurity: Not on file  Transportation Needs: Not on file  Physical Activity: Not on file  Stress: Not on file  Social Connections: Not on file  Intimate Partner Violence: Not on file     Allergies  Allergen Reactions   Prednisone Shortness Of Breath    Per pt. Report 08/03/18   Codeine     REACTION: hallucinations  Niacin     REACTION: rash     Outpatient Medications Prior to Visit  Medication Sig Dispense Refill   albuterol (VENTOLIN HFA) 108 (90 Base) MCG/ACT inhaler Inhale 2 puffs into the lungs every 6 (six) hours as needed for wheezing or shortness of breath. 18 g 5   CALCIUM-MAGNESIUM-ZINC PO Take by mouth daily.     Cholecalciferol (VITAMIN D3) 2000 UNITS capsule Take 2,000 Units by mouth daily.     Coenzyme Q10 (COQ10) 200 MG CAPS Take 1 capsule by mouth daily.     COVID-19 mRNA bivalent vaccine, Pfizer, injection Inject into the muscle. 0.3 mL 0   doxycycline (VIBRA-TABS) 100 MG tablet Take 1 tablet (100 mg total) by mouth 2 (two) times daily. 20 tablet 0   famotidine (PEPCID) 20 MG tablet Take 20 mg by mouth at bedtime.     ipratropium-albuterol (DUONEB) 0.5-2.5 (3) MG/3ML  SOLN Take 3 mLs by nebulization every 6 (six) hours as needed. 360 mL 3   levothyroxine (SYNTHROID) 50 MCG tablet Take 1 tablet (50 mcg total) by mouth daily before breakfast. 90 tablet 0   OMEGA 3 1000 MG CAPS Take 1 capsule by mouth 2 (two) times daily.     Probiotic Product (PROBIOTIC DAILY PO) Take by mouth daily.     simvastatin (ZOCOR) 10 MG tablet Take 1 tablet (10 mg total) by mouth daily. 90 tablet 3   valsartan-hydrochlorothiazide (DIOVAN-HCT) 160-25 MG tablet Take 1 tablet by mouth daily. 90 tablet 3   tiotropium (SPIRIVA HANDIHALER) 18 MCG inhalation capsule Place 1 capsule (18 mcg total) into inhaler and inhale daily. 180 capsule 1   No facility-administered medications prior to visit.   Review of Systems  Constitutional:  Negative for chills, fever, malaise/fatigue and weight loss.  HENT:  Positive for congestion. Negative for sinus pain and sore throat.   Eyes: Negative.   Respiratory:  Positive for cough, sputum production and shortness of breath. Negative for hemoptysis and wheezing.   Cardiovascular:  Negative for chest pain, palpitations, orthopnea, claudication and leg swelling.  Gastrointestinal:  Negative for abdominal pain, heartburn, nausea and vomiting.  Genitourinary: Negative.   Musculoskeletal:  Negative for joint pain and myalgias.  Skin:  Negative for rash.  Neurological:  Negative for weakness.  Endo/Heme/Allergies: Negative.   Psychiatric/Behavioral: Negative.     Objective:   Vitals:   09/09/21 1331  BP: 136/82  Pulse: 88  SpO2: 94%  Weight: 111 lb (50.3 kg)  Height: 5\' 5"  (1.651 m)    Physical Exam Constitutional:      General: She is not in acute distress.    Appearance: She is not ill-appearing.  HENT:     Head: Normocephalic and atraumatic.  Eyes:     General: No scleral icterus.    Conjunctiva/sclera: Conjunctivae normal.     Pupils: Pupils are equal, round, and reactive to light.  Cardiovascular:     Rate and Rhythm: Normal rate and  regular rhythm.     Pulses: Normal pulses.     Heart sounds: Normal heart sounds. No murmur heard. Pulmonary:     Effort: Pulmonary effort is normal.     Breath sounds: Decreased air movement present. Decreased breath sounds present. No wheezing, rhonchi or rales.  Abdominal:     General: Bowel sounds are normal.     Palpations: Abdomen is soft.  Musculoskeletal:     Right lower leg: No edema.     Left lower leg: No edema.  Lymphadenopathy:  Cervical: No cervical adenopathy.  Skin:    General: Skin is warm and dry.  Neurological:     General: No focal deficit present.     Mental Status: She is alert.  Psychiatric:        Mood and Affect: Mood normal.        Behavior: Behavior normal.        Thought Content: Thought content normal.        Judgment: Judgment normal.   CBC    Component Value Date/Time   WBC 8.8 12/12/2020 1353   RBC 4.32 12/12/2020 1353   HGB 14.7 12/12/2020 1353   HCT 42.3 12/12/2020 1353   PLT 293.0 12/12/2020 1353   MCV 98.0 12/12/2020 1353   MCH 34.1 (H) 07/24/2020 1107   MCHC 34.7 12/12/2020 1353   RDW 13.0 12/12/2020 1353   LYMPHSABS 2.9 07/19/2015 0952   MONOABS 0.7 07/19/2015 0952   EOSABS 0.7 07/19/2015 0952   BASOSABS 0.0 07/19/2015 0952   BMP Latest Ref Rng & Units 07/30/2021 06/16/2021 12/12/2020  Glucose 70 - 99 mg/dL - 101(H) 96  BUN 6 - 23 mg/dL - 17 11  Creatinine 0.40 - 1.20 mg/dL - 0.78 0.87  BUN/Creat Ratio 6 - 22 (calc) - - -  Sodium 135 - 145 mEq/L - 141 140  Potassium 3.5 - 5.1 mEq/L - 3.9 3.7  Chloride 96 - 112 mEq/L - 100 101  CO2 19 - 32 mEq/L - 30 29  Calcium 8.6 - 10.4 mg/dL 10.6(H) 10.6(H) 10.4   Chest imaging: CXR 08/18/18 Normal cardiac silhouette. Aortic atherosclerosis with calcification. Hyperinflated lungs and flattened diaphragms compatible with COPD. No focal consolidation. No pleural effusion or pneumothorax. No acute osseous abnormality is evident.  PFT: No flowsheet data found. Spirometry  2019 FEV1/FVC 56% FVC 1.8L (62%) FEV1 1.0L (47%)  Labs:  Path:  Echo:  Heart Catheterization:  Assessment & Plan:   Chronic obstructive pulmonary disease, unspecified COPD type ([place]) - Plan: Pulse oximetry, overnight, predniSONE (DELTASONE) 10 MG tablet, Fluticasone-Umeclidin-Vilant (TRELEGY ELLIPTA) 100-62.5-25 MCG/ACT AEPB, DISCONTINUED: Fluticasone-Umeclidin-Vilant (TRELEGY ELLIPTA) 100-62.5-25 MCG/ACT AEPB  Post-nasal drainage - Plan: fluticasone (FLONASE) 50 MCG/ACT nasal spray, predniSONE (DELTASONE) 10 MG tablet  Discussion: Christine Keller is an 80 year old woman, daily smoker with GERD and hypertension who is referred to pulmonary clinic for evaluation of COPD.   Patient has severe obstructive defect based on spirometry from 2019.  She has progressive shortness of breath over recent years due to ongoing smoking.  We will stop her Spiriva inhaler and transition her to Trelegy Ellipta 1 puff daily.  She can continue as needed DuoNeb nebulizer treatments.  We will start fluticasone nasal spray for her sinus congestion.  We will also trial her on prednisone 20 mg daily for 5 days to aid in her shortness of breath and sinus congestion as she recovers from a upper respiratory viral infection.  She did not qualify for supplemental oxygen upon simple walk today.  We will check nocturnal oximetry on room air.  She is to follow-up in 3 months.  Freda Jackson, MD [facility] Office: [phone]    Current Outpatient Medications:    albuterol (VENTOLIN HFA) 108 (90 Base) MCG/ACT inhaler, Inhale 2 puffs into the lungs every 6 (six) hours as needed for wheezing or shortness of breath., Disp: 18 g, Rfl: 5   CALCIUM-MAGNESIUM-ZINC PO, Take by mouth daily., Disp: , Rfl:    Cholecalciferol (VITAMIN D3) 2000 UNITS capsule, Take 2,000 Units  by mouth daily., Disp: , Rfl:    Coenzyme Q10 (COQ10) 200 MG CAPS, Take 1 capsule by mouth daily., Disp: , Rfl:    COVID-19  mRNA bivalent vaccine, Pfizer, injection, Inject into the muscle., Disp: 0.3 mL, Rfl: 0   doxycycline (VIBRA-TABS) 100 MG tablet, Take 1 tablet (100 mg total) by mouth 2 (two) times daily., Disp: 20 tablet, Rfl: 0   famotidine (PEPCID) 20 MG tablet, Take 20 mg by mouth at bedtime., Disp: , Rfl:    fluticasone (FLONASE) 50 MCG/ACT nasal spray, Place 1 spray into both nostrils daily., Disp: 16 g, Rfl: 2   ipratropium-albuterol (DUONEB) 0.5-2.5 (3) MG/3ML SOLN, Take 3 mLs by nebulization every 6 (six) hours as needed., Disp: 360 mL, Rfl: 3   levothyroxine (SYNTHROID) 50 MCG tablet, Take 1 tablet (50 mcg total) by mouth daily before breakfast., Disp: 90 tablet, Rfl: 0   OMEGA 3 1000 MG CAPS, Take 1 capsule by mouth 2 (two) times daily., Disp: , Rfl:    predniSONE (DELTASONE) 10 MG tablet, Take 2 tablets (20 mg total) by mouth daily with breakfast for 5 days., Disp: 10 tablet, Rfl: 0   Probiotic Product (PROBIOTIC DAILY PO), Take by mouth daily., Disp: , Rfl:    simvastatin (ZOCOR) 10 MG tablet, Take 1 tablet (10 mg total) by mouth daily., Disp: 90 tablet, Rfl: 3   valsartan-hydrochlorothiazide (DIOVAN-HCT) 160-25 MG tablet, Take 1 tablet by mouth daily., Disp: 90 tablet, Rfl: 3   Fluticasone-Umeclidin-Vilant (TRELEGY ELLIPTA) 100-62.5-25 MCG/ACT AEPB, Inhale 1 puff into the lungs daily., Disp: 30 each, Rfl: 6

## 2021-10-02 ENCOUNTER — Encounter: Payer: Self-pay | Admitting: Pulmonary Disease

## 2021-10-02 DIAGNOSIS — J449 Chronic obstructive pulmonary disease, unspecified: Secondary | ICD-10-CM | POA: Diagnosis not present

## 2021-10-16 ENCOUNTER — Other Ambulatory Visit: Payer: Self-pay | Admitting: Family Medicine

## 2021-10-16 DIAGNOSIS — E034 Atrophy of thyroid (acquired): Secondary | ICD-10-CM

## 2021-10-24 ENCOUNTER — Other Ambulatory Visit: Payer: Self-pay | Admitting: Family Medicine

## 2021-10-24 DIAGNOSIS — Z760 Encounter for issue of repeat prescription: Secondary | ICD-10-CM

## 2021-10-24 DIAGNOSIS — E782 Mixed hyperlipidemia: Secondary | ICD-10-CM

## 2021-11-10 ENCOUNTER — Telehealth: Payer: Self-pay | Admitting: Pulmonary Disease

## 2021-11-10 NOTE — Telephone Encounter (Signed)
Please let patient know her ONO showed she spent 1hr 40min below 88%. She qualifies for supplemental oxygen when sleeping. Please send in DME order for 2L supplemental O2 at night when sleeping if patient is ok with order.  Thanks, JD

## 2021-11-11 NOTE — Telephone Encounter (Signed)
Called and spoke with patient's husband since patient was not available. He verbalized understanding of the results. He will let her know of the results and they will discuss the O2. He will have her to call us back.   Will leave encounter open for follow up.

## 2021-12-18 ENCOUNTER — Encounter: Payer: Self-pay | Admitting: Pulmonary Disease

## 2021-12-18 ENCOUNTER — Ambulatory Visit: Payer: Medicare HMO | Admitting: Pulmonary Disease

## 2021-12-18 ENCOUNTER — Other Ambulatory Visit: Payer: Self-pay

## 2021-12-18 VITALS — BP 124/78 | HR 77 | Ht 65.0 in | Wt 115.4 lb

## 2021-12-18 DIAGNOSIS — J449 Chronic obstructive pulmonary disease, unspecified: Secondary | ICD-10-CM

## 2021-12-18 DIAGNOSIS — J9611 Chronic respiratory failure with hypoxia: Secondary | ICD-10-CM | POA: Diagnosis not present

## 2021-12-18 MED ORDER — MONTELUKAST SODIUM 10 MG PO TABS: 10.0000 mg | ORAL_TABLET | Freq: Every day | ORAL | 11 refills | Status: DC

## 2021-12-18 NOTE — Patient Instructions (Addendum)
Continue trelegy ellipta daily ?- rinse mouth out after each use ? ?Use duoneb nebulizer treatment first thing in the morning then take you trelegy 30 minutes to an hour later ? ?Start montelukast '10mg'$  daily ? ?Follow up in 6 months ?

## 2021-12-18 NOTE — Telephone Encounter (Signed)
FYI for you and Tammy D.  ?

## 2021-12-18 NOTE — Progress Notes (Signed)
? ?Synopsis: Referred in December 2022 for COPD by Lamar Blinks, MD ? ?Subjective:  ? ?PATIENT ID: Christine Keller GENDER: female DOB: February 02, 1941, MRN: [id number] ? ?HPI ? ?Chief Complaint  ?Patient presents with  ? Follow-up  ?  3 mo f/u for COPD. States her breathing has been stable since last visit. Productive cough with yellow phlegm.   ? ? ?Christine Keller is an 81 year old woman, daily smoker with GERD and hypertension who returns to pulmonary clinic for evaluation of COPD.  ? ?ONO showed 1hr 50mns below 88%.  She does not wish to start nocturnal oxygen at this time as she reports she is sleeping fine.  The risks of decreased oxygen saturations at night when sleeping over long periods of times were explained to her which included cardiac and neurologic effects. ? ?She was started on trelegy ellipta 1 puff daily at last visit.  She initially noted some improvement but overall does not feel greatly improved compared to Spiriva. ? ?She has significant dyspnea when walking out to her mailbox. ? ?OV 09/09/21 ?She reports increasing shortness of breath over recent weeks after suffering a upper respiratory viral infection which she contracted from her grandchild.  She is having increased sinus congestion and postnasal drainage.  She has been having progressive shortness of breath over recent years and reports having dyspnea when walking from room to room in her home.  She denies any trouble with wheezing.  Prior to the respiratory viral infection she denied any issues with cough or sputum production. ? ?She is currently using Spiriva HandiHaler daily.  She is also using DuoNeb nebulizer treatments as needed. ? ?She is a daily smoker smoking about 3 cigarettes/day.  She was previously smoking 1 pack/day and has been smoking for over 60 years. ? ?Spirometry from 2019 shows severe obstructive defect. ? ?Past Medical History:  ?Diagnosis Date  ? Bilateral carpal tunnel syndrome   ? COPD (chronic obstructive pulmonary disease)  ([place] 1.31.11  ? PFT FEV1 1.19(56%), FVC 2.68(90%), FEV1% 44, TLC 5.80(115%), DLCO 52%  ? Hyperlipidemia   ? Hypertension   ? Osteoporosis   ? OSTEOPENIA  ? Ovarian tumor 2014  ? Testosterone secreting ovarain tumor  ? Thyroid nodule   ? Tobacco abuse   ?  ? ?Family History  ?Problem Relation Age of Onset  ? Heart disease Mother   ? Hypertension Mother   ? Asthma Father   ? Emphysema Father   ? Heart disease Brother   ?  ? ?Social History  ? ?Socioeconomic History  ? Marital status: Married  ?  Spouse name: Not on file  ? Number of children: Not on file  ? Years of education: Not on file  ? Highest education level: Not on file  ?Occupational History  ? Occupation: Retired  ?  Employer: RETIRED  ?  Comment: sNetwork engineer ?Tobacco Use  ? Smoking status: Every Day  ?  Packs/day: 1.00  ?  Years: 60.00  ?  Pack years: 60.00  ?  Types: Cigarettes  ? Smokeless tobacco: Never  ? Tobacco comments:  ?  Less than 1 ppd.  ?Vaping Use  ? Vaping Use: Never used  ?Substance and Sexual Activity  ? Alcohol use: Yes  ?  Alcohol/week: 7.0 standard drinks  ?  Types: 7 Glasses of wine per week  ? Drug use: No  ? Sexual activity: Not on file  ?Other Topics Concern  ? Not on file  ?Social History Narrative  ? Regular  exercise:  3 x weekly  ? Caffeine Use:  occasional  ? ?Social Determinants of Health  ? ?Financial Resource Strain: Not on file  ?Food Insecurity: Not on file  ?Transportation Needs: Not on file  ?Physical Activity: Not on file  ?Stress: Not on file  ?Social Connections: Not on file  ?Intimate Partner Violence: Not on file  ?  ? ?Allergies  ?Allergen Reactions  ? Prednisone Shortness Of Breath  ?  Per pt. Report 08/03/18  ? Codeine   ?  REACTION: hallucinations  ? Niacin   ?  REACTION: rash  ?  ? ?Outpatient Medications Prior to Visit  ?Medication Sig Dispense Refill  ? albuterol (VENTOLIN HFA) 108 (90 Base) MCG/ACT inhaler Inhale 2 puffs into the lungs every 6 (six) hours as needed for wheezing or shortness of breath. 18 g 5  ?  CALCIUM-MAGNESIUM-ZINC PO Take by mouth daily.    ? Cholecalciferol (VITAMIN D3) 2000 UNITS capsule Take 2,000 Units by mouth daily.    ? Coenzyme Q10 (COQ10) 200 MG CAPS Take 1 capsule by mouth daily.    ? COVID-19 mRNA bivalent vaccine, Pfizer, injection Inject into the muscle. 0.3 mL 0  ? famotidine (PEPCID) 20 MG tablet Take 20 mg by mouth at bedtime.    ? fluticasone (FLONASE) 50 MCG/ACT nasal spray Place 1 spray into both nostrils daily. 16 g 2  ? Fluticasone-Umeclidin-Vilant (TRELEGY ELLIPTA) 100-62.5-25 MCG/ACT AEPB Inhale 1 puff into the lungs daily. 30 each 6  ? ipratropium-albuterol (DUONEB) 0.5-2.5 (3) MG/3ML SOLN Take 3 mLs by nebulization every 6 (six) hours as needed. 360 mL 3  ? levothyroxine (SYNTHROID) 50 MCG tablet TAKE ONE TABLET BY MOUTH EVERY MORNING BRFORE BREAKFAST 90 tablet 0  ? OMEGA 3 1000 MG CAPS Take 1 capsule by mouth 2 (two) times daily.    ? Probiotic Product (PROBIOTIC DAILY PO) Take by mouth daily.    ? simvastatin (ZOCOR) 10 MG tablet TAKE ONE TABLET BY MOUTH DAILY 90 tablet 0  ? valsartan-hydrochlorothiazide (DIOVAN-HCT) 160-25 MG tablet Take 1 tablet by mouth daily. 90 tablet 3  ? doxycycline (VIBRA-TABS) 100 MG tablet Take 1 tablet (100 mg total) by mouth 2 (two) times daily. 20 tablet 0  ? ?No facility-administered medications prior to visit.  ? ?Review of Systems  ?Constitutional:  Negative for chills, fever, malaise/fatigue and weight loss.  ?HENT:  Positive for congestion. Negative for sinus pain and sore throat.   ?Eyes: Negative.   ?Respiratory:  Positive for cough, sputum production and shortness of breath. Negative for hemoptysis and wheezing.   ?Cardiovascular:  Negative for chest pain, palpitations, orthopnea, claudication and leg swelling.  ?Gastrointestinal:  Negative for abdominal pain, heartburn, nausea and vomiting.  ?Genitourinary: Negative.   ?Musculoskeletal:  Negative for joint pain and myalgias.  ?Skin:  Negative for rash.  ?Neurological:  Negative for  weakness.  ?Endo/Heme/Allergies: Negative.   ?Psychiatric/Behavioral: Negative.    ? ?Objective:  ? ?Vitals:  ? 12/18/21 1334  ?BP: 124/78  ?Pulse: 77  ?SpO2: 95%  ?Weight: 115 lb 6.4 oz (52.3 kg)  ?Height: '5\' 5"'$  (1.651 m)  ? ? ?Physical Exam ?Constitutional:   ?   General: She is not in acute distress. ?   Appearance: She is not ill-appearing.  ?HENT:  ?   Head: Normocephalic and atraumatic.  ?Eyes:  ?   General: No scleral icterus. ?   Conjunctiva/sclera: Conjunctivae normal.  ?   Pupils: Pupils are equal, round, and reactive to light.  ?Cardiovascular:  ?  Rate and Rhythm: Normal rate and regular rhythm.  ?   Pulses: Normal pulses.  ?   Heart sounds: Normal heart sounds. No murmur heard. ?Pulmonary:  ?   Effort: Pulmonary effort is normal.  ?   Breath sounds: Decreased air movement present. Decreased breath sounds present. No wheezing, rhonchi or rales.  ?Abdominal:  ?   General: Bowel sounds are normal.  ?   Palpations: Abdomen is soft.  ?Musculoskeletal:  ?   Right lower leg: No edema.  ?   Left lower leg: No edema.  ?Lymphadenopathy:  ?   Cervical: No cervical adenopathy.  ?Skin: ?   General: Skin is warm and dry.  ?Neurological:  ?   General: No focal deficit present.  ?   Mental Status: She is alert.  ?Psychiatric:     ?   Mood and Affect: Mood normal.     ?   Behavior: Behavior normal.     ?   Thought Content: Thought content normal.     ?   Judgment: Judgment normal.  ? ?CBC ?   ?Component Value Date/Time  ? WBC 8.8 12/12/2020 1353  ? RBC 4.32 12/12/2020 1353  ? HGB 14.7 12/12/2020 1353  ? HCT 42.3 12/12/2020 1353  ? PLT 293.0 12/12/2020 1353  ? MCV 98.0 12/12/2020 1353  ? MCH 34.1 (H) 07/24/2020 1107  ? MCHC 34.7 12/12/2020 1353  ? RDW 13.0 12/12/2020 1353  ? LYMPHSABS 2.9 07/19/2015 0952  ? MONOABS 0.7 07/19/2015 0952  ? EOSABS 0.7 07/19/2015 0952  ? BASOSABS 0.0 07/19/2015 0952  ? ?BMP Latest Ref Rng & Units 07/30/2021 06/16/2021 12/12/2020  ?Glucose 70 - 99 mg/dL - 101(H) 96  ?BUN 6 - 23 mg/dL - 17 11   ?Creatinine 0.40 - 1.20 mg/dL - 0.78 0.87  ?BUN/Creat Ratio 6 - 22 (calc) - - -  ?Sodium 135 - 145 mEq/L - 141 140  ?Potassium 3.5 - 5.1 mEq/L - 3.9 3.7  ?Chloride 96 - 112 mEq/L - 100 101  ?CO2 19 - 32 m

## 2021-12-19 ENCOUNTER — Telehealth: Payer: Self-pay | Admitting: Pulmonary Disease

## 2021-12-19 NOTE — Telephone Encounter (Signed)
Placed another order that states POC and that patient does not wish for nocturnal oxygen at this time.  ? ?Called and informed patient of this new order. ? ? ?

## 2021-12-22 ENCOUNTER — Telehealth: Payer: Self-pay | Admitting: Pulmonary Disease

## 2021-12-22 NOTE — Telephone Encounter (Signed)
Order was placed after pt's OV with Dr. Erin Fulling 3/16. Routing high priority to PCCs. Please advise on this. ?

## 2021-12-23 ENCOUNTER — Encounter: Payer: Self-pay | Admitting: Pulmonary Disease

## 2021-12-23 DIAGNOSIS — J449 Chronic obstructive pulmonary disease, unspecified: Secondary | ICD-10-CM | POA: Diagnosis not present

## 2021-12-24 NOTE — Telephone Encounter (Signed)
I called [employer] and spoke to Andee Poles - she states pt got O2 yesterday.  Nothing further needed.  ?

## 2021-12-26 NOTE — Progress Notes (Addendum)
Therapist, music at [facility] ?[address], [address] ?[place], [place] [address] ?336 [phone] ?Fax [phone] ? ?Date:  12/29/2021  ? ?Name:  Christine Keller   DOB:  1940-10-17   MRN:  [id number] ? ?PCP:  [name], MD  ? ? ?Chief Complaint: Follow-up (Concerns/ questions: none) ? ? ?History of Present Illness: ? ?Christine Keller is a 82 y.o. very pleasant female patient who presents with the following: ? ?Periodic follow-up today ?Last seen by myself 9/22 ? ?History of COPD, tobacco abuse, prediabetes, hypothyroidism, hyperlipidemia, hypertension ? ?Married to [place] who is also my patient- they adore their grand-daughter  ? ?2nd dose shingrix-they plan to do ?Lung cancer screening CT-declines at this time  ?Covid is UTD  ? ?Labs done in September- minimally elevated calcium at that time- recheck today  ? ?She is a Financial controller pulmonology pt, most recent visit March 16- they just started her on oxygen because her overnight oxygen was under 88% ?She is currently using her portable concentrator-she tends to use it when she leaves the house ?She feels like she can walk more easily with her concentrator ?Patient Active Problem List  ? Diagnosis Date Noted  ? Routine general medical examination at a health care facility 08/14/2015  ? Hypothyroidism 12/05/2014  ? Hyperlipidemia 08/08/2014  ? Osteopenia 12/27/2013  ? GERD (gastroesophageal reflux disease) 12/27/2013  ? Guaiac positive stools 12/27/2013  ? Ovarian tumor 05/16/2012  ? Borderline diabetes 04/16/2011  ? Preventative health care 04/15/2011  ? Hyperglycemia 04/15/2011  ? Essential hypertension 12/22/2010  ? Carpal tunnel syndrome 12/22/2010  ? COPD (chronic obstructive pulmonary disease) ([place]) 11/15/2009  ? POLYCYTHEMIA 10/30/2009  ? TOBACCO ABUSE 10/30/2009  ? DYSPNEA 10/30/2009  ? ? ?Past Medical History:  ?Diagnosis Date  ? Bilateral carpal tunnel syndrome   ? COPD (chronic obstructive pulmonary disease) ([place]) 1.31.11  ? PFT FEV1 1.19(56%),  FVC 2.68(90%), FEV1% 44, TLC 5.80(115%), DLCO 52%  ? Hyperlipidemia   ? Hypertension   ? Osteoporosis   ? OSTEOPENIA  ? Ovarian tumor 2014  ? Testosterone secreting ovarain tumor  ? Thyroid nodule   ? Tobacco abuse   ? ? ?Past Surgical History:  ?Procedure Laterality Date  ? CARPAL TUNNEL RELEASE  05/26/2012  ? Procedure: CARPAL TUNNEL RELEASE;  Surgeon: Cammie Sickle., MD;  Location: [facility];  Service: Orthopedics;  Laterality: Left;  Injection of Right thumb Carpal-Metacarpal joint also  ? CARPAL TUNNEL RELEASE  08/16/2012  ? Procedure: CARPAL TUNNEL RELEASE;  Surgeon: Cammie Sickle., MD;  Location: [facility];  Service: Orthopedics;  Laterality: Right;  Inject Left Thumb Carpal Metacarpal Joint  ? OTHER SURGICAL HISTORY  12/14/12  ? bilateral oopherectomy [facility]  ? STERIOD INJECTION  08/16/2012  ? Procedure: STEROID INJECTION;  Surgeon: Cammie Sickle., MD;  Location: [facility];  Service: Orthopedics;  Laterality: Left;  Inject Left Thumb Carpal Metacarpal Joint  ? TONSILLECTOMY    ? ULNAR NERVE TRANSPOSITION  05/26/2012  ? Procedure: ULNAR NERVE DECOMPRESSION/TRANSPOSITION;  Surgeon: Cammie Sickle., MD;  Location: [facility];  Service: Orthopedics;  Laterality: Left;  ? ? ?Social History  ? ?Tobacco Use  ? Smoking status: Every Day  ?  Packs/day: 1.00  ?  Years: 60.00  ?  Pack years: 60.00  ?  Types: Cigarettes  ? Smokeless tobacco: Never  ? Tobacco comments:  ?  Less than 1 ppd.  ?  Vaping Use  ? Vaping Use: Never used  ?Substance Use Topics  ? Alcohol use: Yes  ?  Alcohol/week: 7.0 standard drinks  ?  Types: 7 Glasses of wine per week  ? Drug use: No  ? ? ?Family History  ?Problem Relation Age of Onset  ? Heart disease Mother   ? Hypertension Mother   ? Asthma Father   ? Emphysema Father   ? Heart disease Brother   ? ? ?Allergies  ?Allergen Reactions  ? Prednisone Shortness Of Breath  ?  Per pt. Report 08/03/18  ? Codeine   ?  REACTION:  hallucinations  ? Niacin   ?  REACTION: rash  ? ? ?Medication list has been reviewed and updated. ? ?Current Outpatient Medications on File Prior to Visit  ?Medication Sig Dispense Refill  ? albuterol (VENTOLIN HFA) 108 (90 Base) MCG/ACT inhaler Inhale 2 puffs into the lungs every 6 (six) hours as needed for wheezing or shortness of breath. 18 g 5  ? CALCIUM-MAGNESIUM-ZINC PO Take by mouth daily.    ? Cholecalciferol (VITAMIN D3) 2000 UNITS capsule Take 2,000 Units by mouth daily.    ? Coenzyme Q10 (COQ10) 200 MG CAPS Take 1 capsule by mouth daily.    ? COVID-19 mRNA bivalent vaccine, Pfizer, injection Inject into the muscle. 0.3 mL 0  ? famotidine (PEPCID) 20 MG tablet Take 20 mg by mouth at bedtime.    ? fluticasone (FLONASE) 50 MCG/ACT nasal spray Place 1 spray into both nostrils daily. 16 g 2  ? Fluticasone-Umeclidin-Vilant (TRELEGY ELLIPTA) 100-62.5-25 MCG/ACT AEPB Inhale 1 puff into the lungs daily. 30 each 6  ? ipratropium-albuterol (DUONEB) 0.5-2.5 (3) MG/3ML SOLN Take 3 mLs by nebulization every 6 (six) hours as needed. 360 mL 3  ? levothyroxine (SYNTHROID) 50 MCG tablet TAKE ONE TABLET BY MOUTH EVERY MORNING BRFORE BREAKFAST 90 tablet 0  ? montelukast (SINGULAIR) 10 MG tablet Take 1 tablet (10 mg total) by mouth at bedtime. 30 tablet 11  ? OMEGA 3 1000 MG CAPS Take 1 capsule by mouth 2 (two) times daily.    ? Probiotic Product (PROBIOTIC DAILY PO) Take by mouth daily.    ? simvastatin (ZOCOR) 10 MG tablet TAKE ONE TABLET BY MOUTH DAILY 90 tablet 0  ? valsartan-hydrochlorothiazide (DIOVAN-HCT) 160-25 MG tablet Take 1 tablet by mouth daily. 90 tablet 3  ? ?No current facility-administered medications on file prior to visit.  ? ? ?Review of Systems: ? ?As per HPI- otherwise negative. ? ? ?Physical Examination: ?Vitals:  ? 12/29/21 1338  ?BP: 140/80  ?Pulse: 82  ?Resp: 18  ?Temp: 98.2 ?F (36.8 ?C)  ?SpO2: 97%  ? ?Vitals:  ? 12/29/21 1338  ?Weight: 116 lb 3.2 oz (52.7 kg)  ?Height: '5\' 5"'$  (1.651 m)  ? ?Body mass  index is 19.34 kg/m?. ?Ideal Body Weight: Weight in (lb) to have BMI = 25: 149.9 ? ?GEN: no acute distress.  Normal weight, looks well.  Chronic smoker ?HEENT: Atraumatic, Normocephalic.  ?Ears and Nose: No external deformity. ?CV: RRR, No M/G/R. No JVD. No thrill. No extra heart sounds. ?PULM: CTA B, no wheezes, crackles, rhonchi. No retractions. No resp. distress. No accessory muscle use. ?ABD: S, NT, ND. No rebound. No HSM. ?EXTR: No c/c/e ?PSYCH: Normally interactive. Conversant.  ?Using oxygen portable concentrator today  ? ?Assessment and Plan: ?Hypercalcemia - Plan: Basic metabolic panel ? ?Pre-diabetes - Plan: Hemoglobin A1c ? ?Essential hypertension - Plan: Basic metabolic panel, CBC ? ?Hypothyroidism, unspecified type - Plan:  TSH ? ?Chronic obstructive pulmonary disease, unspecified COPD type ([place]) ? ?Patient seen today for follow-up.  She was noted to have high calcium at last labs, we will follow-up on this today ?Blood pressure under good control, continue current valsartan/HCTZ-check BMP ?She has started on home oxygen and is tolerating well ?Check TSH today, adjust levothyroxine as necessary ? ?Signed ?Lamar Blinks, MD ? ?Addendum 3/28, received patient labs as below, sent message ? ?Results for orders placed or performed in visit on 12/29/21  ?Basic metabolic panel  ?Result Value Ref Range  ? Sodium 141 135 - 145 mEq/L  ? Potassium 4.0 3.5 - 5.1 mEq/L  ? Chloride 101 96 - 112 mEq/L  ? CO2 29 19 - 32 mEq/L  ? Glucose, Bld 97 70 - 99 mg/dL  ? BUN 15 6 - 23 mg/dL  ? Creatinine, Ser 0.80 0.40 - 1.20 mg/dL  ? GFR 68.97 >60.00 mL/min  ? Calcium 10.5 8.4 - 10.5 mg/dL  ?CBC  ?Result Value Ref Range  ? WBC 8.5 4.0 - 10.5 K/uL  ? RBC 4.47 3.87 - 5.11 Mil/uL  ? Platelets 284.0 150.0 - 400.0 K/uL  ? Hemoglobin 14.5 12.0 - 15.0 g/dL  ? HCT 43.1 36.0 - 46.0 %  ? MCV 96.4 78.0 - 100.0 fl  ? MCHC 33.6 30.0 - 36.0 g/dL  ? RDW 13.1 11.5 - 15.5 %  ?Hemoglobin A1c  ?Result Value Ref Range  ? Hgb A1c MFr Bld 6.4 4.6 -  6.5 %  ?TSH  ?Result Value Ref Range  ? TSH 0.92 0.35 - 5.50 uIU/mL  ? ? ? ?

## 2021-12-26 NOTE — Patient Instructions (Addendum)
Great to see you again today- assuming all is well please see me in 6 months  ? ?

## 2021-12-29 ENCOUNTER — Ambulatory Visit (INDEPENDENT_AMBULATORY_CARE_PROVIDER_SITE_OTHER): Payer: Medicare HMO | Admitting: Family Medicine

## 2021-12-29 DIAGNOSIS — R7303 Prediabetes: Secondary | ICD-10-CM | POA: Diagnosis not present

## 2021-12-29 DIAGNOSIS — E039 Hypothyroidism, unspecified: Secondary | ICD-10-CM

## 2021-12-29 DIAGNOSIS — I10 Essential (primary) hypertension: Secondary | ICD-10-CM | POA: Diagnosis not present

## 2021-12-29 DIAGNOSIS — J449 Chronic obstructive pulmonary disease, unspecified: Secondary | ICD-10-CM | POA: Diagnosis not present

## 2021-12-30 ENCOUNTER — Encounter: Payer: Self-pay | Admitting: Family Medicine

## 2021-12-30 LAB — CBC
HCT: 43.1 % (ref 36.0–46.0)
Hemoglobin: 14.5 g/dL (ref 12.0–15.0)
MCHC: 33.6 g/dL (ref 30.0–36.0)
MCV: 96.4 fl (ref 78.0–100.0)
Platelets: 284 10*3/uL (ref 150.0–400.0)
RBC: 4.47 Mil/uL (ref 3.87–5.11)
RDW: 13.1 % (ref 11.5–15.5)
WBC: 8.5 10*3/uL (ref 4.0–10.5)

## 2021-12-30 LAB — BASIC METABOLIC PANEL
BUN: 15 mg/dL (ref 6–23)
CO2: 29 mEq/L (ref 19–32)
Calcium: 10.5 mg/dL (ref 8.4–10.5)
Chloride: 101 mEq/L (ref 96–112)
Creatinine, Ser: 0.8 mg/dL (ref 0.40–1.20)
GFR: 68.97 mL/min (ref >60.00)
Glucose, Bld: 97 mg/dL (ref 70–99)
Potassium: 4 mEq/L (ref 3.5–5.1)
Sodium: 141 mEq/L (ref 135–145)

## 2021-12-30 LAB — TSH: TSH: 0.92 u[IU]/mL (ref 0.35–5.50)

## 2021-12-30 LAB — HEMOGLOBIN A1C: Hgb A1c MFr Bld: 6.4 % (ref 4.6–6.5)

## 2022-01-12 ENCOUNTER — Other Ambulatory Visit: Payer: Self-pay | Admitting: Family Medicine

## 2022-01-12 DIAGNOSIS — E034 Atrophy of thyroid (acquired): Secondary | ICD-10-CM

## 2022-01-20 ENCOUNTER — Other Ambulatory Visit: Payer: Self-pay | Admitting: Family Medicine

## 2022-01-20 DIAGNOSIS — E782 Mixed hyperlipidemia: Secondary | ICD-10-CM

## 2022-01-20 DIAGNOSIS — Z760 Encounter for issue of repeat prescription: Secondary | ICD-10-CM

## 2022-01-23 DIAGNOSIS — J449 Chronic obstructive pulmonary disease, unspecified: Secondary | ICD-10-CM | POA: Diagnosis not present

## 2022-02-05 ENCOUNTER — Encounter: Payer: Self-pay | Admitting: Family Medicine

## 2022-02-06 MED ORDER — IPRATROPIUM-ALBUTEROL 0.5-2.5 (3) MG/3ML IN SOLN: 3.0000 mL | Freq: Four times a day (QID) | RESPIRATORY_TRACT | 3 refills | Status: DC | PRN

## 2022-02-22 DIAGNOSIS — J449 Chronic obstructive pulmonary disease, unspecified: Secondary | ICD-10-CM | POA: Diagnosis not present

## 2022-03-25 DIAGNOSIS — J449 Chronic obstructive pulmonary disease, unspecified: Secondary | ICD-10-CM | POA: Diagnosis not present

## 2022-04-02 ENCOUNTER — Other Ambulatory Visit: Payer: Self-pay | Admitting: Pulmonary Disease

## 2022-04-02 DIAGNOSIS — J449 Chronic obstructive pulmonary disease, unspecified: Secondary | ICD-10-CM

## 2022-04-24 DIAGNOSIS — J449 Chronic obstructive pulmonary disease, unspecified: Secondary | ICD-10-CM | POA: Diagnosis not present

## 2022-05-25 DIAGNOSIS — J449 Chronic obstructive pulmonary disease, unspecified: Secondary | ICD-10-CM | POA: Diagnosis not present

## 2022-06-25 DIAGNOSIS — J449 Chronic obstructive pulmonary disease, unspecified: Secondary | ICD-10-CM | POA: Diagnosis not present

## 2022-07-03 NOTE — Patient Instructions (Incomplete)
It was great to see you again today, I will be in touch with your labs.  Assuming all is well please see me in about 6 months I recommend getting the latest COVID-vaccine and also dose of RSV this fall Flu shot today

## 2022-07-03 NOTE — Progress Notes (Unsigned)
[facility] [address], [place], [place] [address] [phone]  Date:  07/08/2022   Name:  Christine Keller   DOB:  1941-07-26   MRN:  [id number]  PCP:  [name], MD    Chief Complaint: No chief complaint on file.   History of Present Illness:  Christine Keller is a 81 y.o. very pleasant female patient who presents with the following:  Syna is seen today for follow-up visit History of hypertension, COPD, hypothyroidism, GERD, ovarian tumor, osteopenia/osteoporosis, tobacco use, prediabetes Most recent labs-in March patient had BMP, CBC, A1c, thyroid  Albuterol as needed Flonase DuoNeb as needed Trelegy Ellipta Synthroid Singulair Simvastatin Valsartan/HCTZ  Mammogram 2019 DEXA scan 2014 Lung cancer screening CT Shingrix, pneumonia up-to-date Flu vaccine Recommend latest COVID, RSV Patient Active Problem List   Diagnosis Date Noted   Routine general medical examination at a health care facility 08/14/2015   Hypothyroidism 12/05/2014   Hyperlipidemia 08/08/2014   Osteopenia 12/27/2013   GERD (gastroesophageal reflux disease) 12/27/2013   Guaiac positive stools 12/27/2013   Ovarian tumor 05/16/2012   Borderline diabetes 04/16/2011   Preventative health care 04/15/2011   Hyperglycemia 04/15/2011   Essential hypertension 12/22/2010   Carpal tunnel syndrome 12/22/2010   COPD (chronic obstructive pulmonary disease) ([place]) 11/15/2009   POLYCYTHEMIA 10/30/2009   TOBACCO ABUSE 10/30/2009   DYSPNEA 10/30/2009    Past Medical History:  Diagnosis Date   Bilateral carpal tunnel syndrome    COPD (chronic obstructive pulmonary disease) ([place]) 1.31.11   PFT FEV1 1.19(56%), FVC 2.68(90%), FEV1% 44, TLC 5.80(115%), DLCO 52%   Hyperlipidemia    Hypertension    Osteoporosis    OSTEOPENIA   Ovarian tumor 2014   Testosterone secreting ovarain tumor   Thyroid nodule    Tobacco abuse     Past Surgical  History:  Procedure Laterality Date   CARPAL TUNNEL RELEASE  05/26/2012   Procedure: CARPAL TUNNEL RELEASE;  Surgeon: Cammie Sickle., MD;  Location: [facility];  Service: Orthopedics;  Laterality: Left;  Injection of Right thumb Carpal-Metacarpal joint also   CARPAL TUNNEL RELEASE  08/16/2012   Procedure: CARPAL TUNNEL RELEASE;  Surgeon: Cammie Sickle., MD;  Location: [facility];  Service: Orthopedics;  Laterality: Right;  Inject Left Thumb Carpal Metacarpal Joint   OTHER SURGICAL HISTORY  12/14/12   bilateral oopherectomy [facility]   STERIOD INJECTION  08/16/2012   Procedure: STEROID INJECTION;  Surgeon: Cammie Sickle., MD;  Location: [facility];  Service: Orthopedics;  Laterality: Left;  Inject Left Thumb Carpal Metacarpal Joint   TONSILLECTOMY     ULNAR NERVE TRANSPOSITION  05/26/2012   Procedure: ULNAR NERVE DECOMPRESSION/TRANSPOSITION;  Surgeon: Cammie Sickle., MD;  Location: [facility];  Service: Orthopedics;  Laterality: Left;    Social History   Tobacco Use   Smoking status: Every Day    Packs/day: 1.00    Years: 60.00    Total pack years: 60.00    Types: Cigarettes   Smokeless tobacco: Never   Tobacco comments:    Less than 1 ppd.  Vaping Use   Vaping Use: Never used  Substance Use Topics   Alcohol use: Yes    Alcohol/week: 7.0 standard drinks of alcohol    Types: 7 Glasses of wine per week   Drug use: No    Family History  Problem Relation Age of Onset  Heart disease Mother    Hypertension Mother    Asthma Father    Emphysema Father    Heart disease Brother     Allergies  Allergen Reactions   Prednisone Shortness Of Breath    Per pt. Report 08/03/18   Codeine     REACTION: hallucinations   Niacin     REACTION: rash    Medication list has been reviewed and updated.  Current Outpatient Medications on File Prior to Visit  Medication Sig Dispense Refill   albuterol (VENTOLIN HFA)  108 (90 Base) MCG/ACT inhaler Inhale 2 puffs into the lungs every 6 (six) hours as needed for wheezing or shortness of breath. 18 g 5   CALCIUM-MAGNESIUM-ZINC PO Take by mouth daily.     Cholecalciferol (VITAMIN D3) 2000 UNITS capsule Take 2,000 Units by mouth daily.     Coenzyme Q10 (COQ10) 200 MG CAPS Take 1 capsule by mouth daily.     COVID-19 mRNA bivalent vaccine, Pfizer, injection Inject into the muscle. 0.3 mL 0   famotidine (PEPCID) 20 MG tablet Take 20 mg by mouth at bedtime.     fluticasone (FLONASE) 50 MCG/ACT nasal spray Place 1 spray into both nostrils daily. 16 g 2   ipratropium-albuterol (DUONEB) 0.5-2.5 (3) MG/3ML SOLN Take 3 mLs by nebulization every 6 (six) hours as needed. 360 mL 3   levothyroxine (SYNTHROID) 50 MCG tablet TAKE ONE TABLET BY MOUTH EVERY MORNING BEFORE BREAKFAST 90 tablet 1   montelukast (SINGULAIR) 10 MG tablet Take 1 tablet (10 mg total) by mouth at bedtime. 30 tablet 11   OMEGA 3 1000 MG CAPS Take 1 capsule by mouth 2 (two) times daily.     Probiotic Product (PROBIOTIC DAILY PO) Take by mouth daily.     simvastatin (ZOCOR) 10 MG tablet TAKE ONE TABLET BY MOUTH DAILY 90 tablet 1   TRELEGY ELLIPTA 100-62.5-25 MCG/ACT AEPB INHALE ONE PUFF BY MOUTH DAILY 60 each 5   valsartan-hydrochlorothiazide (DIOVAN-HCT) 160-25 MG tablet Take 1 tablet by mouth daily. 90 tablet 3   No current facility-administered medications on file prior to visit.    Review of Systems:  As per HPI- otherwise negative.   Physical Examination: There were no vitals filed for this visit. There were no vitals filed for this visit. There is no height or weight on file to calculate BMI. Ideal Body Weight:    GEN: no acute distress. HEENT: Atraumatic, Normocephalic.  Ears and Nose: No external deformity. CV: RRR, No M/G/R. No JVD. No thrill. No extra heart sounds. PULM: CTA B, no wheezes, crackles, rhonchi. No retractions. No resp. distress. No accessory muscle use. ABD: S, NT, ND,  +BS. No rebound. No HSM. EXTR: No c/c/e PSYCH: Normally interactive. Conversant.    Assessment and Plan: ***  Signed Lamar Blinks, MD

## 2022-07-08 ENCOUNTER — Ambulatory Visit (INDEPENDENT_AMBULATORY_CARE_PROVIDER_SITE_OTHER): Payer: Medicare HMO | Admitting: Family Medicine

## 2022-07-08 VITALS — BP 120/80 | HR 74 | Temp 97.8°F | Resp 18 | Ht 65.0 in | Wt 117.4 lb

## 2022-07-08 DIAGNOSIS — I10 Essential (primary) hypertension: Secondary | ICD-10-CM | POA: Diagnosis not present

## 2022-07-08 DIAGNOSIS — E782 Mixed hyperlipidemia: Secondary | ICD-10-CM

## 2022-07-08 DIAGNOSIS — Z720 Tobacco use: Secondary | ICD-10-CM | POA: Diagnosis not present

## 2022-07-08 DIAGNOSIS — R7303 Prediabetes: Secondary | ICD-10-CM

## 2022-07-08 DIAGNOSIS — Z23 Encounter for immunization: Secondary | ICD-10-CM

## 2022-07-08 DIAGNOSIS — J449 Chronic obstructive pulmonary disease, unspecified: Secondary | ICD-10-CM

## 2022-07-08 DIAGNOSIS — E039 Hypothyroidism, unspecified: Secondary | ICD-10-CM

## 2022-07-09 ENCOUNTER — Encounter: Payer: Self-pay | Admitting: Family Medicine

## 2022-07-09 LAB — LIPID PANEL
Cholesterol: 168 mg/dL (ref 0–200)
HDL: 61.4 mg/dL (ref >39.00)
LDL Cholesterol: 78 mg/dL (ref 0–99)
NonHDL: 106.39
Total CHOL/HDL Ratio: 3
Triglycerides: 142 mg/dL (ref 0.0–149.0)
VLDL: 28.4 mg/dL (ref 0.0–40.0)

## 2022-07-09 LAB — COMPREHENSIVE METABOLIC PANEL
ALT: 16 U/L (ref 0–35)
AST: 22 U/L (ref 0–37)
Albumin: 4.4 g/dL (ref 3.5–5.2)
Alkaline Phosphatase: 78 U/L (ref 39–117)
BUN: 14 mg/dL (ref 6–23)
CO2: 32 mEq/L (ref 19–32)
Calcium: 10.2 mg/dL (ref 8.4–10.5)
Chloride: 101 mEq/L (ref 96–112)
Creatinine, Ser: 0.84 mg/dL (ref 0.40–1.20)
GFR: 64.81 mL/min (ref >60.00)
Glucose, Bld: 90 mg/dL (ref 70–99)
Potassium: 4 mEq/L (ref 3.5–5.1)
Sodium: 141 mEq/L (ref 135–145)
Total Bilirubin: 0.6 mg/dL (ref 0.2–1.2)
Total Protein: 7 g/dL (ref 6.0–8.3)

## 2022-07-09 LAB — TSH: TSH: 0.7 u[IU]/mL (ref 0.35–5.50)

## 2022-07-09 LAB — HEMOGLOBIN A1C: Hgb A1c MFr Bld: 6.3 % (ref 4.6–6.5)

## 2022-07-10 ENCOUNTER — Other Ambulatory Visit: Payer: Self-pay | Admitting: Family Medicine

## 2022-07-10 DIAGNOSIS — Z760 Encounter for issue of repeat prescription: Secondary | ICD-10-CM

## 2022-07-10 DIAGNOSIS — E034 Atrophy of thyroid (acquired): Secondary | ICD-10-CM

## 2022-07-10 DIAGNOSIS — I10 Essential (primary) hypertension: Secondary | ICD-10-CM

## 2022-07-10 DIAGNOSIS — E782 Mixed hyperlipidemia: Secondary | ICD-10-CM

## 2022-07-13 DIAGNOSIS — H353131 Nonexudative age-related macular degeneration, bilateral, early dry stage: Secondary | ICD-10-CM | POA: Diagnosis not present

## 2022-07-13 DIAGNOSIS — Z961 Presence of intraocular lens: Secondary | ICD-10-CM | POA: Diagnosis not present

## 2022-07-25 DIAGNOSIS — J449 Chronic obstructive pulmonary disease, unspecified: Secondary | ICD-10-CM | POA: Diagnosis not present

## 2022-07-29 ENCOUNTER — Ambulatory Visit: Payer: Medicare HMO | Admitting: Pulmonary Disease

## 2022-08-19 ENCOUNTER — Ambulatory Visit: Payer: Medicare HMO | Admitting: Pulmonary Disease

## 2022-08-19 ENCOUNTER — Ambulatory Visit (HOSPITAL_BASED_OUTPATIENT_CLINIC_OR_DEPARTMENT_OTHER)
Admission: RE | Admit: 2022-08-19 | Discharge: 2022-08-19 | Disposition: A | Discharge status: Home / Self Care | Payer: Medicare HMO | Source: Ambulatory Visit | Attending: Pulmonary Disease | Admitting: Pulmonary Disease

## 2022-08-19 ENCOUNTER — Ambulatory Visit: Payer: Medicare HMO

## 2022-08-19 ENCOUNTER — Encounter: Payer: Self-pay | Admitting: Pulmonary Disease

## 2022-08-19 VITALS — BP 128/74 | HR 80 | Ht 65.0 in | Wt 117.0 lb

## 2022-08-19 DIAGNOSIS — I10 Essential (primary) hypertension: Secondary | ICD-10-CM | POA: Diagnosis not present

## 2022-08-19 DIAGNOSIS — J449 Chronic obstructive pulmonary disease, unspecified: Secondary | ICD-10-CM

## 2022-08-19 DIAGNOSIS — J9611 Chronic respiratory failure with hypoxia: Secondary | ICD-10-CM

## 2022-08-19 DIAGNOSIS — R062 Wheezing: Secondary | ICD-10-CM | POA: Diagnosis not present

## 2022-08-19 DIAGNOSIS — R059 Cough, unspecified: Secondary | ICD-10-CM | POA: Diagnosis not present

## 2022-08-19 MED ORDER — BREZTRI AEROSPHERE 160-9-4.8 MCG/ACT IN AERO: 2.0000 | INHALATION_SPRAY | Freq: Two times a day (BID) | RESPIRATORY_TRACT | 0 refills | Status: DC

## 2022-08-19 NOTE — Patient Instructions (Signed)
We will check a Chest x-ray today  Try Breztri inhaler 2 puffs twice daily - rinse mouth out after each use  Continue duoneb nebulizer treatments twice daily - use before doing the breztri  Please message or call us with how you like the breztri  Follow up in 3 months.

## 2022-08-19 NOTE — Progress Notes (Signed)
Synopsis: Referred in December 2022 for COPD by Lamar Blinks, MD  Subjective:   PATIENT ID: Christine Keller GENDER: female DOB: 09-25-1941, MRN: [id number]  HPI  Chief Complaint  Patient presents with   Follow-up    6 mo f/u for COPD. States she has not noticed a difference in her breathing or coughing. Non productive cough.    Christine Keller is an 81 year old woman, daily smoker with GERD and hypertension who returns to pulmonary clinic for follow up of COPD and chronic respiratory failure.   She has been stable since last visit. She does not feel trelegy is doing anything for her breathing. We discussed how she may not feel relief of her dyspnea but there are advantages of preventing flares in her breathing that could require hospitalization. We also discussed transitioning to nebulizer solution treatments and she is amenable to trying this.   She is using oxygen at night and at time when ambulatory.   OV 12/18/21 ONO showed 1hr 69mns below 88%.  She does not wish to start nocturnal oxygen at this time as she reports she is sleeping fine.  The risks of decreased oxygen saturations at night when sleeping over long periods of times were explained to her which included cardiac and neurologic effects.  She was started on trelegy ellipta 1 puff daily at last visit.  She initially noted some improvement but overall does not feel greatly improved compared to Spiriva.  She has significant dyspnea when walking out to her mailbox.  OV 09/09/21 She reports increasing shortness of breath over recent weeks after suffering a upper respiratory viral infection which she contracted from her grandchild.  She is having increased sinus congestion and postnasal drainage.  She has been having progressive shortness of breath over recent years and reports having dyspnea when walking from room to room in her home.  She denies any trouble with wheezing.  Prior to the respiratory viral infection she denied any  issues with cough or sputum production.  She is currently using Spiriva HandiHaler daily.  She is also using DuoNeb nebulizer treatments as needed.  She is a daily smoker smoking about 3 cigarettes/day.  She was previously smoking 1 pack/day and has been smoking for over 60 years.  Spirometry from 2019 shows severe obstructive defect.  Past Medical History:  Diagnosis Date   Bilateral carpal tunnel syndrome    COPD (chronic obstructive pulmonary disease) (HCC) 1.31.11   PFT FEV1 1.19(56%), FVC 2.68(90%), FEV1% 44, TLC 5.80(115%), DLCO 52%   Hyperlipidemia    Hypertension    Osteoporosis    OSTEOPENIA   Ovarian tumor 2014   Testosterone secreting ovarain tumor   Thyroid nodule    Tobacco abuse      Family History  Problem Relation Age of Onset   Heart disease Mother    Hypertension Mother    Asthma Father    Emphysema Father    Heart disease Brother      Social History   Socioeconomic History   Marital status: Married    Spouse name: Not on file   Number of children: Not on file   Years of education: Not on file   Highest education level: Not on file  Occupational History   Occupation: Retired    [employer] farm manager RETIRED    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  Vaping Use   Vaping Use: Never used  Substance and Sexual Activity   Alcohol use: Yes    Alcohol/week: 7.0 standard drinks of alcohol    Types: 7 Glasses of wine per week   Drug use: No   Sexual activity: Not on file  Other Topics Concern   Not on file  Social History Narrative   Regular exercise:  3 x weekly   Caffeine Use:  occasional   Social Determinants of Health   Financial Resource Strain: Not on file  Food Insecurity: Not on file  Transportation Needs: Not on file  Physical Activity: Not on file  Stress: Not on file   Social Connections: Not on file  Intimate Partner Violence: Not on file     Allergies  Allergen Reactions   Prednisone Shortness Of Breath    Per pt. Report 08/03/18   Codeine     REACTION: hallucinations   Niacin     REACTION: rash     Outpatient Medications Prior to Visit  Medication Sig Dispense Refill   CALCIUM-MAGNESIUM-ZINC PO Take by mouth daily.     Cholecalciferol (VITAMIN D3) 2000 UNITS capsule Take 2,000 Units by mouth daily.     Coenzyme Q10 (COQ10) 200 MG CAPS Take 1 capsule by mouth daily.     COVID-19 mRNA bivalent vaccine, Pfizer, injection Inject into the muscle. 0.3 mL 0   famotidine (PEPCID) 20 MG tablet Take 20 mg by mouth at bedtime.     ipratropium-albuterol (DUONEB) 0.5-2.5 (3) MG/3ML SOLN Take 3 mLs by nebulization every 6 (six) hours as needed. 360 mL 3   levothyroxine (SYNTHROID) 50 MCG tablet Take 1 tablet (50 mcg total) by mouth daily with breakfast. 90 tablet 1   OMEGA 3 1000 MG CAPS Take 1 capsule by mouth 2 (two) times daily.     Probiotic Product (PROBIOTIC DAILY PO) Take by mouth daily.     simvastatin (ZOCOR) 10 MG tablet Take 1 tablet (10 mg total) by mouth daily. 90 tablet 1   valsartan-hydrochlorothiazide (DIOVAN-HCT) 160-25 MG tablet Take 1 tablet by mouth daily. 90 tablet 1   montelukast (SINGULAIR) 10 MG tablet Take 1 tablet (10 mg total) by mouth at bedtime. 30 tablet 11   TRELEGY ELLIPTA 100-62.5-25 MCG/ACT AEPB INHALE ONE PUFF BY MOUTH DAILY 60 each 5   albuterol (VENTOLIN HFA) 108 (90 Base) MCG/ACT inhaler Inhale 2 puffs into the lungs every 6 (six) hours as needed for wheezing or shortness of breath. 18 g 5   fluticasone (FLONASE) 50 MCG/ACT nasal spray Place 1 spray into both nostrils daily. 16 g 2   No facility-administered medications prior to visit.   Review of Systems  Constitutional:  Negative for chills, fever, malaise/fatigue and weight loss.  HENT:  Negative for congestion, sinus pain and sore throat.   Eyes: Negative.    Respiratory:  Positive for shortness of breath. Negative for cough, hemoptysis, sputum production and wheezing.   Cardiovascular:  Negative for chest pain, palpitations, orthopnea, claudication and leg swelling.  Gastrointestinal:  Negative for abdominal pain, heartburn, nausea and vomiting.  Genitourinary: Negative.   Musculoskeletal:  Negative for joint pain and myalgias.  Skin:  Negative for rash.  Neurological:  Negative for weakness.  Endo/Heme/Allergies: Negative.   Psychiatric/Behavioral: Negative.      Objective:   Vitals:   08/19/22 1134  BP: 128/74  Pulse: 80  SpO2:  100%  Weight: 117 lb (53.1 kg)  Height: '5\' 5"'$  (1.651 m)    Physical Exam Constitutional:      General: She is not in acute distress.    Appearance: She is not ill-appearing.  HENT:     Head: Normocephalic and atraumatic.  Eyes:     General: No scleral icterus.    Conjunctiva/sclera: Conjunctivae normal.  Cardiovascular:     Rate and Rhythm: Normal rate and regular rhythm.     Pulses: Normal pulses.     Heart sounds: Normal heart sounds. No murmur heard. Pulmonary:     Effort: Pulmonary effort is normal.     Breath sounds: Decreased air movement present. Decreased breath sounds present. No wheezing, rhonchi or rales.  Musculoskeletal:     Right lower leg: No edema.     Left lower leg: No edema.  Skin:    General: Skin is warm and dry.  Neurological:     General: No focal deficit present.     Mental Status: She is alert.  Psychiatric:        Mood and Affect: Mood normal.        Behavior: Behavior normal.        Thought Content: Thought content normal.        Judgment: Judgment normal.    CBC    Component Value Date/Time   WBC 8.5 12/29/2021 1416   RBC 4.47 12/29/2021 1416   HGB 14.5 12/29/2021 1416   HCT 43.1 12/29/2021 1416   PLT 284.0 12/29/2021 1416   MCV 96.4 12/29/2021 1416   MCH 34.1 (H) 07/24/2020 1107   MCHC 33.6 12/29/2021 1416   RDW 13.1 12/29/2021 1416   LYMPHSABS 2.9  07/19/2015 0952   MONOABS 0.7 07/19/2015 0952   EOSABS 0.7 07/19/2015 0952   BASOSABS 0.0 07/19/2015 0952      Latest Ref Rng & Units 07/08/2022    2:04 PM 12/29/2021    2:16 PM 07/30/2021    2:28 PM  BMP  Glucose 70 - 99 mg/dL 90  97    BUN 6 - 23 mg/dL 14  15    Creatinine 0.40 - 1.20 mg/dL 0.84  0.80    Sodium 135 - 145 mEq/L 141  141    Potassium 3.5 - 5.1 mEq/L 4.0  4.0    Chloride 96 - 112 mEq/L 101  101    CO2 19 - 32 mEq/L 32  29    Calcium 8.4 - 10.5 mg/dL 10.2  10.5  10.6    Chest imaging: CXR 08/18/18 Normal cardiac silhouette. Aortic atherosclerosis with calcification. Hyperinflated lungs and flattened diaphragms compatible with COPD. No focal consolidation. No pleural effusion or pneumothorax. No acute osseous abnormality is evident.  PFT:     No data to display         Spirometry 2019 FEV1/FVC 56% FVC 1.8L (62%) FEV1 1.0L (47%)  Labs:  Path:  Echo:  Heart Catheterization:  Assessment & Plan:   Chronic obstructive pulmonary disease, unspecified COPD type ([place]) - Plan: DG Chest 2 View  Chronic respiratory failure with hypoxia ([place])  Discussion: Christine Keller is an 81 year old woman, daily smoker with GERD and hypertension who returns to pulmonary clinic for follow up of COPD and chronic hypoxemic respiratory failure.   Patient has severe obstructive defect based on spirometry from 2019. She is using 2L of supplemental oxygen at night and with ambulation as needed.    She is to try Breztri 2 puffs twice  daily and stop Trelegy Ellipta 1 puff daily.  I have recommended that she use DuoNeb nebulizer treatment first thing in the morning followed by the dose of her inhaler. She is to also use nebulizer treatment throughout the day every 6 hours as needed. We can consider budesonide nebulizer treatments along with scheduled duonebs if breztri does not help her.  Follow-up in 3 months.  Freda Jackson, MD [facility] Office:  [phone]    Current Outpatient Medications:    Budeson-Glycopyrrol-Formoterol (BREZTRI AEROSPHERE) 160-9-4.8 MCG/ACT AERO, Inhale 2 puffs into the lungs in the morning and at bedtime., Disp: 11.8 g, Rfl: 0   CALCIUM-MAGNESIUM-ZINC PO, Take by mouth daily., Disp: , Rfl:    Cholecalciferol (VITAMIN D3) 2000 UNITS capsule, Take 2,000 Units by mouth daily., Disp: , Rfl:    Coenzyme Q10 (COQ10) 200 MG CAPS, Take 1 capsule by mouth daily., Disp: , Rfl:    COVID-19 mRNA bivalent vaccine, Pfizer, injection, Inject into the muscle., Disp: 0.3 mL, Rfl: 0   famotidine (PEPCID) 20 MG tablet, Take 20 mg by mouth at bedtime., Disp: , Rfl:    ipratropium-albuterol (DUONEB) 0.5-2.5 (3) MG/3ML SOLN, Take 3 mLs by nebulization every 6 (six) hours as needed., Disp: 360 mL, Rfl: 3   levothyroxine (SYNTHROID) 50 MCG tablet, Take 1 tablet (50 mcg total) by mouth daily with breakfast., Disp: 90 tablet, Rfl: 1   OMEGA 3 1000 MG CAPS, Take 1 capsule by mouth 2 (two) times daily., Disp: , Rfl:    Probiotic Product (PROBIOTIC DAILY PO), Take by mouth daily., Disp: , Rfl:    simvastatin (ZOCOR) 10 MG tablet, Take 1 tablet (10 mg total) by mouth daily., Disp: 90 tablet, Rfl: 1   valsartan-hydrochlorothiazide (DIOVAN-HCT) 160-25 MG tablet, Take 1 tablet by mouth daily., Disp: 90 tablet, Rfl: 1

## 2022-08-21 ENCOUNTER — Encounter: Payer: Self-pay | Admitting: Pulmonary Disease

## 2022-08-25 DIAGNOSIS — J449 Chronic obstructive pulmonary disease, unspecified: Secondary | ICD-10-CM | POA: Diagnosis not present

## 2022-09-24 DIAGNOSIS — J449 Chronic obstructive pulmonary disease, unspecified: Secondary | ICD-10-CM | POA: Diagnosis not present

## 2022-09-26 ENCOUNTER — Other Ambulatory Visit: Payer: Self-pay | Admitting: Pulmonary Disease

## 2022-09-26 DIAGNOSIS — J449 Chronic obstructive pulmonary disease, unspecified: Secondary | ICD-10-CM

## 2022-10-25 DIAGNOSIS — J449 Chronic obstructive pulmonary disease, unspecified: Secondary | ICD-10-CM | POA: Diagnosis not present

## 2022-10-28 ENCOUNTER — Telehealth: Payer: Self-pay | Admitting: Family Medicine

## 2022-10-28 NOTE — Telephone Encounter (Signed)
Called to schedule preventative visit;  last documented preventative visit was 09/02/2017. Husband, Berneta Sages, answered and said he would speak to patient about their schedule and they will call back.

## 2022-11-25 ENCOUNTER — Encounter: Payer: Self-pay | Admitting: Pulmonary Disease

## 2022-11-25 ENCOUNTER — Ambulatory Visit: Payer: Medicare HMO | Admitting: Pulmonary Disease

## 2022-11-25 VITALS — BP 118/66 | HR 72 | Ht 65.0 in | Wt 116.0 lb

## 2022-11-25 DIAGNOSIS — J449 Chronic obstructive pulmonary disease, unspecified: Secondary | ICD-10-CM | POA: Diagnosis not present

## 2022-11-25 DIAGNOSIS — J9611 Chronic respiratory failure with hypoxia: Secondary | ICD-10-CM | POA: Diagnosis not present

## 2022-11-25 MED ORDER — PREDNISONE 20 MG PO TABS: 20.0000 mg | ORAL_TABLET | Freq: Every day | ORAL | 0 refills | Status: DC

## 2022-11-25 MED ORDER — ALBUTEROL SULFATE HFA 108 (90 BASE) MCG/ACT IN AERS: 2.0000 | INHALATION_SPRAY | Freq: Four times a day (QID) | RESPIRATORY_TRACT | 6 refills | Status: AC | PRN

## 2022-11-25 MED ORDER — AZITHROMYCIN 250 MG PO TABS: ORAL_TABLET | ORAL | 0 refills | Status: DC

## 2022-11-25 NOTE — Progress Notes (Signed)
Synopsis: Referred in December 2022 for COPD by Lamar Blinks, MD  Subjective:   PATIENT ID: Christine Keller GENDER: female DOB: 1940-12-25, MRN: [id number]  HPI  Chief Complaint  Patient presents with   Follow-up   Christine Keller is an 82 year old woman, daily smoker with GERD and hypertension who returns to pulmonary clinic for follow up of COPD and chronic respiratory failure.   She continues to have significant shortness of breath and limitations in her daily activities. She is using nebulizer treatment in the morning prior to taking her trelegy. She has increased cough with mucous production over the past couple of weeks. She is still reluctant to using her oxygen on a regular basis.  She continues to smoke daily.  OV 08/19/2022 She has been stable since last visit. She does not feel trelegy is doing anything for her breathing. We discussed how she may not feel relief of her dyspnea but there are advantages of preventing flares in her breathing that could require hospitalization. We also discussed transitioning to nebulizer solution treatments and she is amenable to trying this.   She is using oxygen at night and at time when ambulatory.   OV 12/18/21 ONO showed 1hr 29mns below 88%.  She does not wish to start nocturnal oxygen at this time as she reports she is sleeping fine.  The risks of decreased oxygen saturations at night when sleeping over long periods of times were explained to her which included cardiac and neurologic effects.  She was started on trelegy ellipta 1 puff daily at last visit.  She initially noted some improvement but overall does not feel greatly improved compared to Spiriva.  She has significant dyspnea when walking out to her mailbox.  OV 09/09/21 She reports increasing shortness of breath over recent weeks after suffering a upper respiratory viral infection which she contracted from her grandchild.  She is having increased sinus congestion and postnasal  drainage.  She has been having progressive shortness of breath over recent years and reports having dyspnea when walking from room to room in her home.  She denies any trouble with wheezing.  Prior to the respiratory viral infection she denied any issues with cough or sputum production.  She is currently using Spiriva HandiHaler daily.  She is also using DuoNeb nebulizer treatments as needed.  She is a daily smoker smoking about 3 cigarettes/day.  She was previously smoking 1 pack/day and has been smoking for over 60 years.  Spirometry from 2019 shows severe obstructive defect.  Past Medical History:  Diagnosis Date   Bilateral carpal tunnel syndrome    COPD (chronic obstructive pulmonary disease) ([place] 1.31.11   PFT FEV1 1.19(56%), FVC 2.68(90%), FEV1% 44, TLC 5.80(115%), DLCO 52%   Hyperlipidemia    Hypertension    Osteoporosis    OSTEOPENIA   Ovarian tumor 2014   Testosterone secreting ovarain tumor   Thyroid nodule    Tobacco abuse      Family History  Problem Relation Age of Onset   Heart disease Mother    Hypertension Mother    Asthma Father    Emphysema Father    Heart disease Brother      Social History   Socioeconomic History   Marital status: Married    Spouse name: Not on file   Number of children: Not on file   Years of education: Not on file   Highest education level: Not on file  Occupational History   Occupation: Retired  Employer: RETIRED    Comment: secretary  Tobacco Use   Smoking status: Every Day    Packs/day: 1.00    Years: 60.00    Total pack years: 60.00    Types: Cigarettes   Smokeless tobacco: Never   Tobacco comments:    Less than 1 ppd.  Vaping Use   Vaping Use: Never used  Substance and Sexual Activity   Alcohol use: Yes    Alcohol/week: 7.0 standard drinks of alcohol    Types: 7 Glasses of wine per week   Drug use: No   Sexual activity: Not on file  Other Topics Concern   Not on file  Social History Narrative   Regular  exercise:  3 x weekly   Caffeine Use:  occasional   Social Determinants of Health   Financial Resource Strain: Not on file  Food Insecurity: Not on file  Transportation Needs: Not on file  Physical Activity: Not on file  Stress: Not on file  Social Connections: Not on file  Intimate Partner Violence: Not on file     Allergies  Allergen Reactions   Codeine     REACTION: hallucinations   Niacin     REACTION: rash     Outpatient Medications Prior to Visit  Medication Sig Dispense Refill   CALCIUM-MAGNESIUM-ZINC PO Take by mouth daily.     Cholecalciferol (VITAMIN D3) 2000 UNITS capsule Take 2,000 Units by mouth daily.     Coenzyme Q10 (COQ10) 200 MG CAPS Take 1 capsule by mouth daily.     COVID-19 mRNA bivalent vaccine, Pfizer, injection Inject into the muscle. 0.3 mL 0   famotidine (PEPCID) 20 MG tablet Take 20 mg by mouth at bedtime.     ipratropium-albuterol (DUONEB) 0.5-2.5 (3) MG/3ML SOLN Take 3 mLs by nebulization every 6 (six) hours as needed. 360 mL 3   levothyroxine (SYNTHROID) 50 MCG tablet Take 1 tablet (50 mcg total) by mouth daily with breakfast. 90 tablet 1   OMEGA 3 1000 MG CAPS Take 1 capsule by mouth 2 (two) times daily.     Probiotic Product (PROBIOTIC DAILY PO) Take by mouth daily.     simvastatin (ZOCOR) 10 MG tablet Take 1 tablet (10 mg total) by mouth daily. 90 tablet 1   TRELEGY ELLIPTA 100-62.5-25 MCG/ACT AEPB INHALE 1 PUFF BY MOUTH DAILY 60 each 5   valsartan-hydrochlorothiazide (DIOVAN-HCT) 160-25 MG tablet Take 1 tablet by mouth daily. 90 tablet 1   Budeson-Glycopyrrol-Formoterol (BREZTRI AEROSPHERE) 160-9-4.8 MCG/ACT AERO Inhale 2 puffs into the lungs in the morning and at bedtime. 11.8 g 0   No facility-administered medications prior to visit.   Review of Systems  Constitutional:  Negative for chills, fever, malaise/fatigue and weight loss.  HENT:  Negative for congestion, sinus pain and sore throat.   Eyes: Negative.   Respiratory:  Positive for  cough, sputum production and shortness of breath. Negative for hemoptysis and wheezing.   Cardiovascular:  Negative for chest pain, palpitations, orthopnea, claudication and leg swelling.  Gastrointestinal:  Negative for abdominal pain, heartburn, nausea and vomiting.  Genitourinary: Negative.   Musculoskeletal:  Negative for joint pain and myalgias.  Skin:  Negative for rash.  Neurological:  Negative for weakness.  Endo/Heme/Allergies: Negative.   Psychiatric/Behavioral: Negative.      Objective:   Vitals:   11/25/22 1311  BP: 118/66  Pulse: 72  SpO2: 97%  Weight: 116 lb (52.6 kg)  Height: '5\' 5"'$  (1.651 m)    Physical Exam Constitutional:  General: She is not in acute distress.    Appearance: She is not ill-appearing.  HENT:     Head: Normocephalic and atraumatic.  Eyes:     General: No scleral icterus.    Conjunctiva/sclera: Conjunctivae normal.  Cardiovascular:     Rate and Rhythm: Normal rate and regular rhythm.     Pulses: Normal pulses.     Heart sounds: Normal heart sounds. No murmur heard. Pulmonary:     Effort: Pulmonary effort is normal.     Breath sounds: Decreased air movement present. Decreased breath sounds and wheezing present. No rhonchi or rales.  Musculoskeletal:     Right lower leg: No edema.     Left lower leg: No edema.  Skin:    General: Skin is warm and dry.  Neurological:     General: No focal deficit present.     Mental Status: She is alert.    CBC    Component Value Date/Time   WBC 8.5 12/29/2021 1416   RBC 4.47 12/29/2021 1416   HGB 14.5 12/29/2021 1416   HCT 43.1 12/29/2021 1416   PLT 284.0 12/29/2021 1416   MCV 96.4 12/29/2021 1416   MCH 34.1 (H) 07/24/2020 1107   MCHC 33.6 12/29/2021 1416   RDW 13.1 12/29/2021 1416   LYMPHSABS 2.9 07/19/2015 0952   MONOABS 0.7 07/19/2015 0952   EOSABS 0.7 07/19/2015 0952   BASOSABS 0.0 07/19/2015 0952      Latest Ref Rng & Units 07/08/2022    2:04 PM 12/29/2021    2:16 PM 07/30/2021     2:28 PM  BMP  Glucose 70 - 99 mg/dL 90  97    BUN 6 - 23 mg/dL 14  15    Creatinine 0.40 - 1.20 mg/dL 0.84  0.80    Sodium 135 - 145 mEq/L 141  141    Potassium 3.5 - 5.1 mEq/L 4.0  4.0    Chloride 96 - 112 mEq/L 101  101    CO2 19 - 32 mEq/L 32  29    Calcium 8.4 - 10.5 mg/dL 10.2  10.5  10.6    Chest imaging: CXR 08/18/18 Normal cardiac silhouette. Aortic atherosclerosis with calcification. Hyperinflated lungs and flattened diaphragms compatible with COPD. No focal consolidation. No pleural effusion or pneumothorax. No acute osseous abnormality is evident.  PFT:     No data to display         Spirometry 2019 FEV1/FVC 56% FVC 1.8L (62%) FEV1 1.0L (47%)  Labs:  Path:  Echo:  Heart Catheterization:  Assessment & Plan:   Chronic obstructive pulmonary disease, unspecified COPD type ([place]) - Plan: albuterol (VENTOLIN HFA) 108 (90 Base) MCG/ACT inhaler, predniSONE (DELTASONE) 20 MG tablet, azithromycin (ZITHROMAX) 250 MG tablet  Chronic respiratory failure with hypoxia (HCC)  Discussion: Christine Keller is an 82 year old woman, daily smoker with GERD and hypertension who returns to pulmonary clinic for follow up of COPD and chronic hypoxemic respiratory failure.   Patient has severe obstructive defect based on spirometry from 2019. She is using 2L of supplemental oxygen at night and with ambulation as needed.    She is to continue duoneb treatments as needed and daily prior to using her trelegy ellipta 1 puff daily.   We will start her on low dose prednisone taper and Zpak for COPD exacerbation. She does not tolerate full dose steroid courses due to side effects.   She wishes to remain on inhaler therapy at this time rather than switching to  long acting nebulizer regimen.   Follow-up in 6 months.  Christine Jackson, MD [facility] Office: [phone]    Current Outpatient Medications:    albuterol (VENTOLIN HFA) 108 (90 Base) MCG/ACT  inhaler, Inhale 2 puffs into the lungs every 6 (six) hours as needed for wheezing or shortness of breath., Disp: 8 g, Rfl: 6   azithromycin (ZITHROMAX) 250 MG tablet, Take as directed, Disp: 6 tablet, Rfl: 0   CALCIUM-MAGNESIUM-ZINC PO, Take by mouth daily., Disp: , Rfl:    Cholecalciferol (VITAMIN D3) 2000 UNITS capsule, Take 2,000 Units by mouth daily., Disp: , Rfl:    Coenzyme Q10 (COQ10) 200 MG CAPS, Take 1 capsule by mouth daily., Disp: , Rfl:    COVID-19 mRNA bivalent vaccine, Pfizer, injection, Inject into the muscle., Disp: 0.3 mL, Rfl: 0   famotidine (PEPCID) 20 MG tablet, Take 20 mg by mouth at bedtime., Disp: , Rfl:    ipratropium-albuterol (DUONEB) 0.5-2.5 (3) MG/3ML SOLN, Take 3 mLs by nebulization every 6 (six) hours as needed., Disp: 360 mL, Rfl: 3   levothyroxine (SYNTHROID) 50 MCG tablet, Take 1 tablet (50 mcg total) by mouth daily with breakfast., Disp: 90 tablet, Rfl: 1   OMEGA 3 1000 MG CAPS, Take 1 capsule by mouth 2 (two) times daily., Disp: , Rfl:    predniSONE (DELTASONE) 20 MG tablet, Take 1 tablet (20 mg total) by mouth daily with breakfast., Disp: 5 tablet, Rfl: 0   Probiotic Product (PROBIOTIC DAILY PO), Take by mouth daily., Disp: , Rfl:    simvastatin (ZOCOR) 10 MG tablet, Take 1 tablet (10 mg total) by mouth daily., Disp: 90 tablet, Rfl: 1   TRELEGY ELLIPTA 100-62.5-25 MCG/ACT AEPB, INHALE 1 PUFF BY MOUTH DAILY, Disp: 60 each, Rfl: 5   valsartan-hydrochlorothiazide (DIOVAN-HCT) 160-25 MG tablet, Take 1 tablet by mouth daily., Disp: 90 tablet, Rfl: 1

## 2022-11-25 NOTE — Patient Instructions (Addendum)
Continue duoneb nebulizer treatment in the morning before trelegy  Continue trelegy ellipta 1 puff daily  Start prednisone 82m daily for 5 days  Start Zpak antibiotic for  5 days  Follow up in 6 months

## 2022-11-27 ENCOUNTER — Encounter: Payer: Self-pay | Admitting: Pulmonary Disease

## 2022-12-22 ENCOUNTER — Other Ambulatory Visit: Payer: Self-pay | Admitting: Pulmonary Disease

## 2022-12-22 DIAGNOSIS — J449 Chronic obstructive pulmonary disease, unspecified: Secondary | ICD-10-CM

## 2022-12-24 DIAGNOSIS — J449 Chronic obstructive pulmonary disease, unspecified: Secondary | ICD-10-CM | POA: Diagnosis not present

## 2022-12-31 ENCOUNTER — Other Ambulatory Visit: Payer: Self-pay | Admitting: Family Medicine

## 2022-12-31 DIAGNOSIS — E782 Mixed hyperlipidemia: Secondary | ICD-10-CM

## 2022-12-31 DIAGNOSIS — Z760 Encounter for issue of repeat prescription: Secondary | ICD-10-CM

## 2022-12-31 DIAGNOSIS — I10 Essential (primary) hypertension: Secondary | ICD-10-CM

## 2023-01-01 ENCOUNTER — Other Ambulatory Visit: Payer: Self-pay | Admitting: Family Medicine

## 2023-01-03 ENCOUNTER — Other Ambulatory Visit: Payer: Self-pay | Admitting: Family Medicine

## 2023-01-06 ENCOUNTER — Other Ambulatory Visit: Payer: Self-pay | Admitting: Family Medicine

## 2023-01-06 DIAGNOSIS — E034 Atrophy of thyroid (acquired): Secondary | ICD-10-CM

## 2023-01-15 ENCOUNTER — Telehealth: Payer: Self-pay | Admitting: Family Medicine

## 2023-01-15 NOTE — Telephone Encounter (Signed)
Contacted Christine Keller to schedule their annual wellness visit. Call back at later date: 01/18/23  Verlee Rossetti; Care Guide Ambulatory Clinical Support  l [facility] Direct Dial: [phone]

## 2023-01-18 ENCOUNTER — Telehealth: Payer: Self-pay | Admitting: Family Medicine

## 2023-01-18 NOTE — Telephone Encounter (Signed)
Contacted Demita Votto to schedule their annual wellness visit. Call back at later date: 02/08/2023  Verlee Rossetti; Care Guide Ambulatory Clinical Support [place] [facility] Direct Dial: [phone]

## 2023-01-24 DIAGNOSIS — J449 Chronic obstructive pulmonary disease, unspecified: Secondary | ICD-10-CM | POA: Diagnosis not present

## 2023-02-14 NOTE — Progress Notes (Unsigned)
Christine Keller at [facility] [address], [address] [place], [place] [address] [phone]  Date:  02/17/2023   Name:  Christine Keller   DOB:  09/26/1941   MRN:  [id number]  PCP:  Pearline Cables, MD    Chief Complaint: No chief complaint on file.   History of Present Illness:  Christine Keller is a 82 y.o. very pleasant female patient who presents with the following:  Pt seen today for CPE Last visit with myself was in October   History of hypertension, COPD, hypothyroidism, GERD, ovarian tumor, osteopenia/osteoporosis, tobacco use, prediabetes   Has declined mammo, dexa, lung cancer screening CT  Seen by pulmonology in November: [name] is an 82 year old woman, daily smoker with GERD and hypertension who returns to pulmonary clinic for follow up of COPD and chronic hypoxemic respiratory failure.  Patient has severe obstructive defect based on spirometry from 2019. She is using 2L of supplemental oxygen at night and with ambulation as needed.   She is to try Breztri 2 puffs twice daily and stop Trelegy Ellipta 1 puff daily.  I have recommended that she use DuoNeb nebulizer treatment first thing in the morning followed by the dose of her inhaler. She is to also use nebulizer treatment throughout the day every 6 hours as needed. We can consider budesonide nebulizer treatments along with scheduled duonebs if breztri does not help her.   Albuterol as needed Flonase DuoNeb as needed Trelegy Ellipta Synthroid Singulair Simvastatin Valsartan/HCTZ  Patient Active Problem List   Diagnosis Date Noted   Routine general medical examination at a health care facility 08/14/2015   Hypothyroidism 12/05/2014   Hyperlipidemia 08/08/2014   Osteopenia 12/27/2013   GERD (gastroesophageal reflux disease) 12/27/2013   Guaiac positive stools 12/27/2013   Ovarian tumor 05/16/2012   Prediabetes 04/16/2011   Preventative health care 04/15/2011    Hyperglycemia 04/15/2011   Essential hypertension 12/22/2010   Carpal tunnel syndrome 12/22/2010   COPD (chronic obstructive pulmonary disease) (HCC) 11/15/2009   POLYCYTHEMIA 10/30/2009   TOBACCO ABUSE 10/30/2009   DYSPNEA 10/30/2009    Past Medical History:  Diagnosis Date   Bilateral carpal tunnel syndrome    COPD (chronic obstructive pulmonary disease) (HCC) 1.31.11   PFT FEV1 1.19(56%), FVC 2.68(90%), FEV1% 44, TLC 5.80(115%), DLCO 52%   Hyperlipidemia    Hypertension    Osteoporosis    OSTEOPENIA   Ovarian tumor 2014   Testosterone secreting ovarain tumor   Thyroid nodule    Tobacco abuse     Past Surgical History:  Procedure Laterality Date   CARPAL TUNNEL RELEASE  05/26/2012   Procedure: CARPAL TUNNEL RELEASE;  Surgeon: Wyn Forster., MD;  Location: [facility];  Service: Orthopedics;  Laterality: Left;  Injection of Right thumb Carpal-Metacarpal joint also   CARPAL TUNNEL RELEASE  08/16/2012   Procedure: CARPAL TUNNEL RELEASE;  Surgeon: Wyn Forster., MD;  Location: [facility];  Service: Orthopedics;  Laterality: Right;  Inject Left Thumb Carpal Metacarpal Joint   OTHER SURGICAL HISTORY  12/14/12   bilateral oopherectomy [facility]   STERIOD INJECTION  08/16/2012   Procedure: STEROID INJECTION;  Surgeon: Wyn Forster., MD;  Location: [facility];  Service: Orthopedics;  Laterality: Left;  Inject Left Thumb Carpal Metacarpal Joint   TONSILLECTOMY     ULNAR NERVE TRANSPOSITION  05/26/2012   Procedure: ULNAR NERVE DECOMPRESSION/TRANSPOSITION;  Surgeon: Wyn Forster.,  MD;  Location: [facility];  Service: Orthopedics;  Laterality: Left;    Social History   Tobacco Use   Smoking status: Every Day    Packs/day: 1.00    Years: 60.00    Additional pack years: 0.00    Total pack years: 60.00    Types: Cigarettes   Smokeless tobacco: Never   Tobacco comments:    Less than 1 ppd.  Vaping Use    Vaping Use: Never used  Substance Use Topics   Alcohol use: Yes    Alcohol/week: 7.0 standard drinks of alcohol    Types: 7 Glasses of wine per week   Drug use: No    Family History  Problem Relation Age of Onset   Heart disease Mother    Hypertension Mother    Asthma Father    Emphysema Father    Heart disease Brother     Allergies  Allergen Reactions   Codeine     REACTION: hallucinations   Niacin     REACTION: rash    Medication list has been reviewed and updated.  Current Outpatient Medications on File Prior to Visit  Medication Sig Dispense Refill   albuterol (VENTOLIN HFA) 108 (90 Base) MCG/ACT inhaler Inhale 2 puffs into the lungs every 6 (six) hours as needed for wheezing or shortness of breath. 8 g 6   azithromycin (ZITHROMAX) 250 MG tablet Take as directed 6 tablet 0   CALCIUM-MAGNESIUM-ZINC PO Take by mouth daily.     Cholecalciferol (VITAMIN D3) 2000 UNITS capsule Take 2,000 Units by mouth daily.     Coenzyme Q10 (COQ10) 200 MG CAPS Take 1 capsule by mouth daily.     COVID-19 mRNA bivalent vaccine, Pfizer, injection Inject into the muscle. 0.3 mL 0   famotidine (PEPCID) 20 MG tablet Take 20 mg by mouth at bedtime.     ipratropium-albuterol (DUONEB) 0.5-2.5 (3) MG/3ML SOLN USE 3 ML VIA NEBULIZER EVERY 6 HOURS AS NEEDED 90 mL 2   ipratropium-albuterol (DUONEB) 0.5-2.5 (3) MG/3ML SOLN USE 3 ML VIA NEBULIZER EVERY 6 HOURS AS NEEDED 90 mL 1   levothyroxine (SYNTHROID) 50 MCG tablet Take 1 tablet (50 mcg total) by mouth daily before breakfast. 30 tablet 0   montelukast (SINGULAIR) 10 MG tablet TAKE ONE TABLET BY MOUTH AT BEDTIME 90 tablet 1   OMEGA 3 1000 MG CAPS Take 1 capsule by mouth 2 (two) times daily.     predniSONE (DELTASONE) 20 MG tablet Take 1 tablet (20 mg total) by mouth daily with breakfast. 5 tablet 0   Probiotic Product (PROBIOTIC DAILY PO) Take by mouth daily.     simvastatin (ZOCOR) 10 MG tablet TAKE 1 TABLET BY MOUTH DAILY 90 tablet 1   TRELEGY  ELLIPTA 100-62.5-25 MCG/ACT AEPB INHALE 1 PUFF BY MOUTH DAILY 60 each 5   valsartan-hydrochlorothiazide (DIOVAN-HCT) 160-25 MG tablet TAKE 1 TABLET BY MOUTH DAILY 90 tablet 1   No current facility-administered medications on file prior to visit.    Review of Systems:  As per HPI- otherwise negative.   Physical Examination: There were no vitals filed for this visit. There were no vitals filed for this visit. There is no height or weight on file to calculate BMI. Ideal Body Weight:    GEN: no acute distress. HEENT: Atraumatic, Normocephalic.  Ears and Nose: No external deformity. CV: RRR, No M/G/R. No JVD. No thrill. No extra heart sounds. PULM: CTA B, no wheezes, crackles, rhonchi. No retractions. No resp. distress. No  accessory muscle use. ABD: S, NT, ND, +BS. No rebound. No HSM. EXTR: No c/c/e PSYCH: Normally interactive. Conversant.    Assessment and Plan: *** Physical exam today- encouraged healthy diet and exercise routine  Will plan further follow- up pending labs.  Signed Abbe Amsterdam, MD

## 2023-02-14 NOTE — Patient Instructions (Signed)
Great to see you again today!  Assuming all is well please see me in about 6 months  Let me know if your BP starts running high at home (higher than 140/90)- if we see this consistently we can go up on your mediation  I will refill your thyroid medication once your TSH comes back

## 2023-02-17 ENCOUNTER — Ambulatory Visit (INDEPENDENT_AMBULATORY_CARE_PROVIDER_SITE_OTHER): Payer: Medicare HMO | Admitting: Family Medicine

## 2023-02-17 ENCOUNTER — Encounter: Payer: Self-pay | Admitting: Family Medicine

## 2023-02-17 VITALS — BP 144/80 | HR 75 | Temp 98.2°F | Resp 18 | Ht 65.0 in | Wt 117.0 lb

## 2023-02-17 DIAGNOSIS — E034 Atrophy of thyroid (acquired): Secondary | ICD-10-CM | POA: Diagnosis not present

## 2023-02-17 DIAGNOSIS — J449 Chronic obstructive pulmonary disease, unspecified: Secondary | ICD-10-CM | POA: Diagnosis not present

## 2023-02-17 DIAGNOSIS — E039 Hypothyroidism, unspecified: Secondary | ICD-10-CM

## 2023-02-17 DIAGNOSIS — R7303 Prediabetes: Secondary | ICD-10-CM | POA: Diagnosis not present

## 2023-02-17 DIAGNOSIS — Z0000 Encounter for general adult medical examination without abnormal findings: Secondary | ICD-10-CM

## 2023-02-17 DIAGNOSIS — I10 Essential (primary) hypertension: Secondary | ICD-10-CM

## 2023-02-17 DIAGNOSIS — E782 Mixed hyperlipidemia: Secondary | ICD-10-CM

## 2023-02-17 DIAGNOSIS — Z720 Tobacco use: Secondary | ICD-10-CM

## 2023-02-17 LAB — BASIC METABOLIC PANEL
BUN: 15 mg/dL (ref 6–23)
CO2: 32 mEq/L (ref 19–32)
Calcium: 10.3 mg/dL (ref 8.4–10.5)
Chloride: 101 mEq/L (ref 96–112)
Creatinine, Ser: 0.78 mg/dL (ref 0.40–1.20)
GFR: 70.53 mL/min (ref >60.00)
Glucose, Bld: 94 mg/dL (ref 70–99)
Potassium: 4.2 mEq/L (ref 3.5–5.1)
Sodium: 141 mEq/L (ref 135–145)

## 2023-02-17 LAB — CBC
HCT: 42.1 % (ref 36.0–46.0)
Hemoglobin: 14.2 g/dL (ref 12.0–15.0)
MCHC: 33.7 g/dL (ref 30.0–36.0)
MCV: 97.3 fl (ref 78.0–100.0)
Platelets: 290 10*3/uL (ref 150.0–400.0)
RBC: 4.33 Mil/uL (ref 3.87–5.11)
RDW: 13.3 % (ref 11.5–15.5)
WBC: 8.1 10*3/uL (ref 4.0–10.5)

## 2023-02-17 LAB — TSH: TSH: 0.89 u[IU]/mL (ref 0.35–5.50)

## 2023-02-17 LAB — HEMOGLOBIN A1C: Hgb A1c MFr Bld: 6.2 % (ref 4.6–6.5)

## 2023-02-17 MED ORDER — LEVOTHYROXINE SODIUM 50 MCG PO TABS: 50.0000 ug | ORAL_TABLET | Freq: Every day | ORAL | 3 refills | Status: DC

## 2023-02-23 DIAGNOSIS — J449 Chronic obstructive pulmonary disease, unspecified: Secondary | ICD-10-CM | POA: Diagnosis not present

## 2023-03-26 DIAGNOSIS — J449 Chronic obstructive pulmonary disease, unspecified: Secondary | ICD-10-CM | POA: Diagnosis not present

## 2023-04-01 ENCOUNTER — Other Ambulatory Visit: Payer: Self-pay | Admitting: Pulmonary Disease

## 2023-04-01 DIAGNOSIS — J449 Chronic obstructive pulmonary disease, unspecified: Secondary | ICD-10-CM

## 2023-04-25 DIAGNOSIS — J449 Chronic obstructive pulmonary disease, unspecified: Secondary | ICD-10-CM | POA: Diagnosis not present

## 2023-05-26 DIAGNOSIS — J449 Chronic obstructive pulmonary disease, unspecified: Secondary | ICD-10-CM | POA: Diagnosis not present

## 2023-06-26 ENCOUNTER — Other Ambulatory Visit: Payer: Self-pay | Admitting: Family Medicine

## 2023-06-26 DIAGNOSIS — Z760 Encounter for issue of repeat prescription: Secondary | ICD-10-CM

## 2023-06-26 DIAGNOSIS — J449 Chronic obstructive pulmonary disease, unspecified: Secondary | ICD-10-CM | POA: Diagnosis not present

## 2023-06-26 DIAGNOSIS — E782 Mixed hyperlipidemia: Secondary | ICD-10-CM

## 2023-06-26 DIAGNOSIS — I10 Essential (primary) hypertension: Secondary | ICD-10-CM

## 2023-07-06 ENCOUNTER — Encounter: Payer: Self-pay | Admitting: Family Medicine

## 2023-07-06 MED ORDER — IPRATROPIUM-ALBUTEROL 0.5-2.5 (3) MG/3ML IN SOLN: 3.0000 mL | Freq: Four times a day (QID) | RESPIRATORY_TRACT | 3 refills | Status: DC | PRN

## 2023-07-06 MED ORDER — IPRATROPIUM-ALBUTEROL 0.5-2.5 (3) MG/3ML IN SOLN: 3.0000 mL | Freq: Four times a day (QID) | RESPIRATORY_TRACT | 1 refills | Status: DC | PRN

## 2023-07-21 DIAGNOSIS — Z961 Presence of intraocular lens: Secondary | ICD-10-CM | POA: Diagnosis not present

## 2023-07-21 DIAGNOSIS — H353131 Nonexudative age-related macular degeneration, bilateral, early dry stage: Secondary | ICD-10-CM | POA: Diagnosis not present

## 2023-07-26 DIAGNOSIS — J449 Chronic obstructive pulmonary disease, unspecified: Secondary | ICD-10-CM | POA: Diagnosis not present

## 2023-08-03 DIAGNOSIS — H52223 Regular astigmatism, bilateral: Secondary | ICD-10-CM | POA: Diagnosis not present

## 2023-08-03 DIAGNOSIS — H524 Presbyopia: Secondary | ICD-10-CM | POA: Diagnosis not present

## 2023-08-26 DIAGNOSIS — J449 Chronic obstructive pulmonary disease, unspecified: Secondary | ICD-10-CM | POA: Diagnosis not present

## 2023-09-24 ENCOUNTER — Other Ambulatory Visit: Payer: Self-pay | Admitting: Pulmonary Disease

## 2023-09-24 ENCOUNTER — Other Ambulatory Visit: Payer: Self-pay | Admitting: Family Medicine

## 2023-09-24 DIAGNOSIS — Z760 Encounter for issue of repeat prescription: Secondary | ICD-10-CM

## 2023-09-24 DIAGNOSIS — I10 Essential (primary) hypertension: Secondary | ICD-10-CM

## 2023-09-24 DIAGNOSIS — J449 Chronic obstructive pulmonary disease, unspecified: Secondary | ICD-10-CM

## 2023-09-25 DIAGNOSIS — J449 Chronic obstructive pulmonary disease, unspecified: Secondary | ICD-10-CM | POA: Diagnosis not present

## 2023-10-22 ENCOUNTER — Other Ambulatory Visit: Payer: Self-pay | Admitting: Family Medicine

## 2023-10-22 DIAGNOSIS — I10 Essential (primary) hypertension: Secondary | ICD-10-CM

## 2023-10-22 DIAGNOSIS — Z760 Encounter for issue of repeat prescription: Secondary | ICD-10-CM

## 2023-10-26 ENCOUNTER — Other Ambulatory Visit: Payer: Self-pay | Admitting: Family Medicine

## 2023-10-26 DIAGNOSIS — J449 Chronic obstructive pulmonary disease, unspecified: Secondary | ICD-10-CM | POA: Diagnosis not present

## 2023-10-26 DIAGNOSIS — Z760 Encounter for issue of repeat prescription: Secondary | ICD-10-CM

## 2023-10-26 DIAGNOSIS — I10 Essential (primary) hypertension: Secondary | ICD-10-CM

## 2023-10-26 DIAGNOSIS — E782 Mixed hyperlipidemia: Secondary | ICD-10-CM

## 2023-11-06 NOTE — Patient Instructions (Incomplete)
It was good to see you again today, I will be in touch with your blood work  If not done already recommend second dose of shingles vaccine, also tetanus booster and RSV at your pharmacy Let's try tolterodine (detrol) for your bladder symptoms- let me know how this works for you

## 2023-11-06 NOTE — Progress Notes (Unsigned)
[facility] [address], [address] [place], [place] [address] [phone]  Date:  11/08/2023   Name:  Christine Keller   DOB:  03-22-1941   MRN:  [id number]  PCP:  Pearline Cables, MD    Chief Complaint: No chief complaint on file.   History of Present Illness:  Christine Keller is a 83 y.o. very pleasant female patient who presents with the following:  Patient seen today for periodic follow-up Most recent visit with myself was in May 2024 History of hypertension, COPD, hypothyroidism, GERD, ovarian tumor, osteopenia/osteoporosis, tobacco use, prediabetes    Has declined mammo, dexa, lung cancer screening CT, colon cancer screening  She does have oxygen available at home but does not typically use it Most recent visit with her pulmonologist was about 1 year ago Seen by Dr. Dione Booze, her eye doctor in October  Second dose of Shingrix Flu shot Recommend COVID booster and tetanus booster  Valsartan/hydrochlorothiazide Simvastatin Singulair Levothyroxine DuoNeb Trelegy Patient Active Problem List   Diagnosis Date Noted   Routine general medical examination at a health care facility 08/14/2015   Hypothyroidism 12/05/2014   Hyperlipidemia 08/08/2014   Osteopenia 12/27/2013   GERD (gastroesophageal reflux disease) 12/27/2013   Guaiac positive stools 12/27/2013   Ovarian tumor 05/16/2012   Prediabetes 04/16/2011   Preventative health care 04/15/2011   Hyperglycemia 04/15/2011   Essential hypertension 12/22/2010   Carpal tunnel syndrome 12/22/2010   COPD (chronic obstructive pulmonary disease) (HCC) 11/15/2009   POLYCYTHEMIA 10/30/2009   TOBACCO ABUSE 10/30/2009   DYSPNEA 10/30/2009    Past Medical History:  Diagnosis Date   Bilateral carpal tunnel syndrome    COPD (chronic obstructive pulmonary disease) (HCC) 1.31.11   PFT FEV1 1.19(56%), FVC 2.68(90%), FEV1% 44, TLC 5.80(115%), DLCO 52%   Hyperlipidemia     Hypertension    Osteoporosis    OSTEOPENIA   Ovarian tumor 2014   Testosterone secreting ovarain tumor   Thyroid nodule    Tobacco abuse     Past Surgical History:  Procedure Laterality Date   CARPAL TUNNEL RELEASE  05/26/2012   Procedure: CARPAL TUNNEL RELEASE;  Surgeon: Wyn Forster., MD;  Location: [facility];  Service: Orthopedics;  Laterality: Left;  Injection of Right thumb Carpal-Metacarpal joint also   CARPAL TUNNEL RELEASE  08/16/2012   Procedure: CARPAL TUNNEL RELEASE;  Surgeon: Wyn Forster., MD;  Location: [facility];  Service: Orthopedics;  Laterality: Right;  Inject Left Thumb Carpal Metacarpal Joint   OTHER SURGICAL HISTORY  12/14/12   bilateral oopherectomy [facility]   STERIOD INJECTION  08/16/2012   Procedure: STEROID INJECTION;  Surgeon: Wyn Forster., MD;  Location: [facility];  Service: Orthopedics;  Laterality: Left;  Inject Left Thumb Carpal Metacarpal Joint   TONSILLECTOMY     ULNAR NERVE TRANSPOSITION  05/26/2012   Procedure: ULNAR NERVE DECOMPRESSION/TRANSPOSITION;  Surgeon: Wyn Forster., MD;  Location: [facility];  Service: Orthopedics;  Laterality: Left;    Social History   Tobacco Use   Smoking status: Every Day    Current packs/day: 1.00    Average packs/day: 1 pack/day for 60.0 years (60.0 ttl pk-yrs)    Types: Cigarettes   Smokeless tobacco: Never   Tobacco comments:    Less than 1 ppd.  Vaping Use   Vaping status: Never Used  Substance Use Topics   Alcohol use: Yes  Alcohol/week: 7.0 standard drinks of alcohol    Types: 7 Glasses of wine per week   Drug use: No    Family History  Problem Relation Age of Onset   Heart disease Mother    Hypertension Mother    Asthma Father    Emphysema Father    Heart disease Brother     Allergies  Allergen Reactions   Codeine     REACTION: hallucinations   Niacin     REACTION: rash    Medication list has been  reviewed and updated.  Current Outpatient Medications on File Prior to Visit  Medication Sig Dispense Refill   albuterol (VENTOLIN HFA) 108 (90 Base) MCG/ACT inhaler Inhale 2 puffs into the lungs every 6 (six) hours as needed for wheezing or shortness of breath. 8 g 6   CALCIUM-MAGNESIUM-ZINC PO Take by mouth daily.     Cholecalciferol (VITAMIN D3) 2000 UNITS capsule Take 2,000 Units by mouth daily.     Coenzyme Q10 (COQ10) 200 MG CAPS Take 1 capsule by mouth daily.     COVID-19 mRNA bivalent vaccine, Pfizer, injection Inject into the muscle. 0.3 mL 0   famotidine (PEPCID) 20 MG tablet Take 20 mg by mouth at bedtime.     Fluticasone-Umeclidin-Vilant (TRELEGY ELLIPTA) 100-62.5-25 MCG/ACT AEPB INHALE 1 PUFF BY MOUTH DAILY 60 each 1   ipratropium-albuterol (DUONEB) 0.5-2.5 (3) MG/3ML SOLN Take 3 mLs by nebulization every 6 (six) hours as needed. 360 mL 3   levothyroxine (SYNTHROID) 50 MCG tablet Take 1 tablet (50 mcg total) by mouth daily before breakfast. 90 tablet 3   montelukast (SINGULAIR) 10 MG tablet TAKE ONE TABLET BY MOUTH AT BEDTIME 90 tablet 1   OMEGA 3 1000 MG CAPS Take 1 capsule by mouth 2 (two) times daily.     predniSONE (DELTASONE) 20 MG tablet Take 1 tablet (20 mg total) by mouth daily with breakfast. 5 tablet 0   Probiotic Product (PROBIOTIC DAILY PO) Take by mouth daily.     simvastatin (ZOCOR) 10 MG tablet Take 1 tablet (10 mg total) by mouth daily. 90 tablet 0   valsartan-hydrochlorothiazide (DIOVAN-HCT) 160-25 MG tablet TAKE 1 TABLET BY MOUTH DAILY **MUST CALL MD FOR APPOINTMENT 30 tablet 0   No current facility-administered medications on file prior to visit.    Review of Systems:  As per HPI- otherwise negative.   Physical Examination: There were no vitals filed for this visit. There were no vitals filed for this visit. There is no height or weight on file to calculate BMI. Ideal Body Weight:    GEN: no acute distress. HEENT: Atraumatic, Normocephalic.  Ears  and Nose: No external deformity. CV: RRR, No M/G/R. No JVD. No thrill. No extra heart sounds. PULM: CTA B, no wheezes, crackles, rhonchi. No retractions. No resp. distress. No accessory muscle use. ABD: S, NT, ND, +BS. No rebound. No HSM. EXTR: No c/c/e PSYCH: Normally interactive. Conversant.    Assessment and Plan: ***  Signed Abbe Amsterdam, MD

## 2023-11-08 ENCOUNTER — Ambulatory Visit (INDEPENDENT_AMBULATORY_CARE_PROVIDER_SITE_OTHER): Payer: Medicare HMO | Admitting: Family Medicine

## 2023-11-08 VITALS — BP 140/72 | HR 89 | Temp 97.9°F | Resp 18 | Ht 65.0 in | Wt 111.4 lb

## 2023-11-08 DIAGNOSIS — I10 Essential (primary) hypertension: Secondary | ICD-10-CM

## 2023-11-08 DIAGNOSIS — E039 Hypothyroidism, unspecified: Secondary | ICD-10-CM | POA: Diagnosis not present

## 2023-11-08 DIAGNOSIS — N3941 Urge incontinence: Secondary | ICD-10-CM

## 2023-11-08 DIAGNOSIS — Z720 Tobacco use: Secondary | ICD-10-CM | POA: Diagnosis not present

## 2023-11-08 DIAGNOSIS — Z760 Encounter for issue of repeat prescription: Secondary | ICD-10-CM | POA: Diagnosis not present

## 2023-11-08 DIAGNOSIS — E034 Atrophy of thyroid (acquired): Secondary | ICD-10-CM

## 2023-11-08 DIAGNOSIS — R7303 Prediabetes: Secondary | ICD-10-CM

## 2023-11-08 DIAGNOSIS — E782 Mixed hyperlipidemia: Secondary | ICD-10-CM | POA: Diagnosis not present

## 2023-11-08 DIAGNOSIS — J449 Chronic obstructive pulmonary disease, unspecified: Secondary | ICD-10-CM

## 2023-11-08 MED ORDER — TOLTERODINE TARTRATE ER 4 MG PO CP24: 4.0000 mg | ORAL_CAPSULE | Freq: Every day | ORAL | 6 refills | Status: DC

## 2023-11-08 MED ORDER — SIMVASTATIN 10 MG PO TABS: 10.0000 mg | ORAL_TABLET | Freq: Every day | ORAL | 3 refills | Status: AC

## 2023-11-08 MED ORDER — LEVOTHYROXINE SODIUM 50 MCG PO TABS: 50.0000 ug | ORAL_TABLET | Freq: Every day | ORAL | 3 refills | Status: DC

## 2023-11-09 ENCOUNTER — Encounter: Payer: Self-pay | Admitting: Family Medicine

## 2023-11-09 LAB — COMPREHENSIVE METABOLIC PANEL
ALT: 17 U/L (ref 0–35)
AST: 25 U/L (ref 0–37)
Albumin: 4.4 g/dL (ref 3.5–5.2)
Alkaline Phosphatase: 67 U/L (ref 39–117)
BUN: 14 mg/dL (ref 6–23)
CO2: 32 meq/L (ref 19–32)
Calcium: 10.2 mg/dL (ref 8.4–10.5)
Chloride: 101 meq/L (ref 96–112)
Creatinine, Ser: 0.89 mg/dL (ref 0.40–1.20)
GFR: 59.9 mL/min — ABNORMAL LOW (ref >60.00)
Glucose, Bld: 89 mg/dL (ref 70–99)
Potassium: 4.1 meq/L (ref 3.5–5.1)
Sodium: 143 meq/L (ref 135–145)
Total Bilirubin: 0.7 mg/dL (ref 0.2–1.2)
Total Protein: 6.8 g/dL (ref 6.0–8.3)

## 2023-11-09 LAB — TSH: TSH: 0.83 u[IU]/mL (ref 0.35–5.50)

## 2023-11-09 LAB — LIPID PANEL
Cholesterol: 179 mg/dL (ref 0–200)
HDL: 69.1 mg/dL (ref >39.00)
LDL Cholesterol: 86 mg/dL (ref 0–99)
NonHDL: 110.33
Total CHOL/HDL Ratio: 3
Triglycerides: 123 mg/dL (ref 0.0–149.0)
VLDL: 24.6 mg/dL (ref 0.0–40.0)

## 2023-11-09 LAB — CBC
HCT: 43.5 % (ref 36.0–46.0)
Hemoglobin: 14.7 g/dL (ref 12.0–15.0)
MCHC: 33.7 g/dL (ref 30.0–36.0)
MCV: 100.6 fL — ABNORMAL HIGH (ref 78.0–100.0)
Platelets: 282 10*3/uL (ref 150.0–400.0)
RBC: 4.33 Mil/uL (ref 3.87–5.11)
RDW: 13.2 % (ref 11.5–15.5)
WBC: 7.2 10*3/uL (ref 4.0–10.5)

## 2023-11-09 LAB — HEMOGLOBIN A1C: Hgb A1c MFr Bld: 6.3 % (ref 4.6–6.5)

## 2023-11-22 ENCOUNTER — Other Ambulatory Visit: Payer: Self-pay | Admitting: Pulmonary Disease

## 2023-11-22 ENCOUNTER — Other Ambulatory Visit: Payer: Self-pay | Admitting: Family Medicine

## 2023-11-22 DIAGNOSIS — J449 Chronic obstructive pulmonary disease, unspecified: Secondary | ICD-10-CM

## 2023-11-22 DIAGNOSIS — Z760 Encounter for issue of repeat prescription: Secondary | ICD-10-CM

## 2023-11-22 DIAGNOSIS — I10 Essential (primary) hypertension: Secondary | ICD-10-CM

## 2023-11-26 DIAGNOSIS — J449 Chronic obstructive pulmonary disease, unspecified: Secondary | ICD-10-CM | POA: Diagnosis not present

## 2023-11-29 ENCOUNTER — Ambulatory Visit: Payer: Medicare HMO | Admitting: Primary Care

## 2023-11-29 ENCOUNTER — Ambulatory Visit: Payer: Medicare HMO

## 2023-11-29 ENCOUNTER — Encounter: Payer: Self-pay | Admitting: Primary Care

## 2023-11-29 VITALS — BP 166/76 | HR 73 | Ht 65.0 in | Wt 111.6 lb

## 2023-11-29 DIAGNOSIS — J449 Chronic obstructive pulmonary disease, unspecified: Secondary | ICD-10-CM | POA: Diagnosis not present

## 2023-11-29 DIAGNOSIS — J9611 Chronic respiratory failure with hypoxia: Secondary | ICD-10-CM | POA: Diagnosis not present

## 2023-11-29 DIAGNOSIS — J439 Emphysema, unspecified: Secondary | ICD-10-CM | POA: Diagnosis not present

## 2023-11-29 MED ORDER — MONTELUKAST SODIUM 10 MG PO TABS: 10.0000 mg | ORAL_TABLET | Freq: Every day | ORAL | 3 refills | Status: AC

## 2023-11-29 MED ORDER — TRELEGY ELLIPTA 100-62.5-25 MCG/ACT IN AEPB: 1.0000 | INHALATION_SPRAY | Freq: Every day | RESPIRATORY_TRACT | 5 refills | Status: DC

## 2023-11-29 NOTE — Progress Notes (Signed)
 @Patient  ID: Christine Keller, female    DOB: 14-Nov-1940, 83 y.o.   MRN: [id number]  Chief Complaint  Patient presents with   Follow-up    Referring provider: Copland, Gwenlyn Found, MD  HPI: 83 year old female, current everyday smoker.  Past medical history significant for hypertension, COPD, GERD, hypothyroidism, ovarian tumor, osteopenia, polycythemia, prediabetes, tobacco abuse.  Patient of Dr. Francine Graven, last seen in office February 2024.  11/29/2023- interim hx  Discussed the use of AI scribe software for clinical note transcription with the patient, who gave verbal consent to proceed.  History of Present Illness   Patient presents today for a 1 year follow-up.  She is followed by Dr. Francine Graven for history of COPD and chronic respiratory failure. Pulmonary function testing in November 2019 showed moderate obstructive lung disease.  Maintained on Trelegy Ellipta 100 mcg, as needed albuterol HFA/DuoNeb q 6 hours and montelukast 10 mg at bedtime.  Breathing has remained stable over the past year, with more difficulty in the morning that improves as the day progresses. She is able to perform most activities of daily living, such as laundry and cleaning, but requires rest between tasks. She does not use oxygen at night, only using it for long walks or when visiting the doctor's office. An overnight oximetry test in December 2022 showed oxygen levels dropping to 81% with a baseline of 90%.  She is currently on Trelegy once daily, albuterol or ipratropium-albuterol nebulizer every six hours as needed, and montelukast at bedtime. She uses the nebulizer once daily in the morning before taking Trelegy. She sometimes feels unsure if she has inhaled the Trelegy properly. She has a chronic cough with congestion but seldom coughs up mucus and does not notice any bad taste or color in the mucus. No fevers, sweats, or chills. She reports chronic congestion and occasional hoarseness after coughing.  She continues to  smoke less than six cigarettes a day. She sleeps approximately six hours at night and takes naps during the day, often sleeping in a chair due to difficulty breathing when lying down. She has a large oxygen machine at home with a 50-foot hose and an extra oxygen tank for emergencies.      Allergies  Allergen Reactions   Codeine     REACTION: hallucinations   Niacin     REACTION: rash    Immunization History  Administered Date(s) Administered   Fluad Quad(high Dose 65+) 07/08/2022   Influenza Split 08/19/2011, 06/16/2012   Influenza Whole 06/19/2009   Influenza, High Dose Seasonal PF 06/30/2016, 07/06/2017, 06/09/2018, 06/16/2021   Influenza,inj,Quad PF,6+ Mos 06/26/2013, 07/02/2014   Influenza-Unspecified 06/21/2015, 07/15/2020, 07/14/2023   Moderna Covid-19 Fall Seasonal Vaccine 59yrs & older 07/28/2023   PFIZER(Purple Top)SARS-COV-2 Vaccination 10/12/2019, 11/19/2019, 07/02/2020, 01/28/2021   Pfizer Covid-19 Vaccine Bivalent Booster 30yrs & up 07/10/2021   Pneumococcal Conjugate-13 07/11/2013   Pneumococcal Polysaccharide-23 07/06/2006   Td 07/06/2006   Tdap 07/19/2012   Zoster Recombinant(Shingrix) 05/15/2021   Zoster, Live 01/27/2006    Past Medical History:  Diagnosis Date   Bilateral carpal tunnel syndrome    COPD (chronic obstructive pulmonary disease) (HCC) 1.31.11   PFT FEV1 1.19(56%), FVC 2.68(90%), FEV1% 44, TLC 5.80(115%), DLCO 52%   Hyperlipidemia    Hypertension    Osteoporosis    OSTEOPENIA   Ovarian tumor 2014   Testosterone secreting ovarain tumor   Thyroid nodule    Tobacco abuse     Tobacco History: Social History   Tobacco Use  Smoking Status Every Day  Current packs/day: 1.00   Average packs/day: 1 pack/day for 60.0 years (60.0 ttl pk-yrs)   Types: Cigarettes  Smokeless Tobacco Never  Tobacco Comments   Less than 1 ppd.   Ready to quit: Not Answered Counseling given: Not Answered Tobacco comments: Less than 1 ppd.   Outpatient  Medications Prior to Visit  Medication Sig Dispense Refill   albuterol (VENTOLIN HFA) 108 (90 Base) MCG/ACT inhaler Inhale 2 puffs into the lungs every 6 (six) hours as needed for wheezing or shortness of breath. 8 g 6   CALCIUM-MAGNESIUM-ZINC PO Take by mouth daily.     Cholecalciferol (VITAMIN D3) 2000 UNITS capsule Take 2,000 Units by mouth daily.     Coenzyme Q10 (COQ10) 200 MG CAPS Take 1 capsule by mouth daily.     famotidine (PEPCID) 20 MG tablet Take 20 mg by mouth at bedtime.     Fluticasone-Umeclidin-Vilant (TRELEGY ELLIPTA) 100-62.5-25 MCG/ACT AEPB Take 1 puff by mouth daily. 60 each 0   ipratropium-albuterol (DUONEB) 0.5-2.5 (3) MG/3ML SOLN Take 3 mLs by nebulization every 6 (six) hours as needed. 360 mL 3   levothyroxine (SYNTHROID) 50 MCG tablet Take 1 tablet (50 mcg total) by mouth daily before breakfast. 90 tablet 3   montelukast (SINGULAIR) 10 MG tablet TAKE ONE TABLET BY MOUTH AT BEDTIME 90 tablet 1   OMEGA 3 1000 MG CAPS Take 1 capsule by mouth 2 (two) times daily.     Probiotic Product (PROBIOTIC DAILY PO) Take by mouth daily.     simvastatin (ZOCOR) 10 MG tablet Take 1 tablet (10 mg total) by mouth daily. 90 tablet 3   tolterodine (DETROL LA) 4 MG 24 hr capsule Take 1 capsule (4 mg total) by mouth daily. 30 capsule 6   valsartan-hydrochlorothiazide (DIOVAN-HCT) 160-25 MG tablet Take 1 tablet by mouth daily. 90 tablet 0   No facility-administered medications prior to visit.   Review of Systems  Review of Systems  Constitutional: Negative.   HENT:  Positive for congestion.   Respiratory:  Positive for cough. Negative for chest tightness, shortness of breath and wheezing.    Physical Exam  BP (!) 166/76 (BP Location: Left Arm, Patient Position: Sitting, Cuff Size: Normal)   Pulse 73   Ht 5\' 5"  (1.651 m)   Wt 111 lb 9.6 oz (50.6 kg)   SpO2 97%   BMI 18.57 kg/m  Physical Exam Constitutional:      General: She is not in acute distress.    Appearance: Normal  appearance. She is not ill-appearing.  HENT:     Head: Normocephalic and atraumatic.     Mouth/Throat:     Mouth: Mucous membranes are moist.     Pharynx: Oropharynx is clear.  Cardiovascular:     Rate and Rhythm: Normal rate and regular rhythm.  Pulmonary:     Effort: Pulmonary effort is normal.     Breath sounds: Normal breath sounds. No wheezing or rhonchi.     Comments: 2L POC Skin:    General: Skin is warm and dry.  Neurological:     General: No focal deficit present.     Mental Status: She is alert and oriented to person, place, and time. Mental status is at baseline.  Psychiatric:        Mood and Affect: Mood normal.        Behavior: Behavior normal.        Thought Content: Thought content normal.        Judgment: Judgment normal.  Lab Results:  CBC    Component Value Date/Time   WBC 7.2 11/08/2023 1428   RBC 4.33 11/08/2023 1428   HGB 14.7 11/08/2023 1428   HCT 43.5 11/08/2023 1428   PLT 282.0 11/08/2023 1428   MCV 100.6 (H) 11/08/2023 1428   MCH 34.1 (H) 07/24/2020 1107   MCHC 33.7 11/08/2023 1428   RDW 13.2 11/08/2023 1428   LYMPHSABS 2.9 07/19/2015 0952   MONOABS 0.7 07/19/2015 0952   EOSABS 0.7 07/19/2015 0952   BASOSABS 0.0 07/19/2015 0952    BMET    Component Value Date/Time   NA 143 11/08/2023 1428   K 4.1 11/08/2023 1428   CL 101 11/08/2023 1428   CO2 32 11/08/2023 1428   GLUCOSE 89 11/08/2023 1428   BUN 14 11/08/2023 1428   CREATININE 0.89 11/08/2023 1428   CREATININE 0.83 07/24/2020 1107   CALCIUM 10.2 11/08/2023 1428   GFRNONAA 68 (L) 05/25/2012 1200   GFRAA 79 (L) 05/25/2012 1200    BNP No results found for: "BNP"  ProBNP No results found for: "PROBNP"  Imaging: No results found.   Assessment & Plan:   1. Chronic obstructive pulmonary disease, unspecified COPD type (HCC) (Primary) - DG Chest 2 View; Future - Fluticasone-Umeclidin-Vilant (TRELEGY ELLIPTA) 100-62.5-25 MCG/ACT AEPB; Take 1 puff by mouth daily.  Dispense:  60 each; Refill: 5 - montelukast (SINGULAIR) 10 MG tablet; Take 1 tablet (10 mg total) by mouth at bedtime.  Dispense: 90 tablet; Refill: 3  2. Chronic respiratory failure with hypoxia (HCC)     Chronic Obstructive Pulmonary Disease (COPD) and Respiratory Failure Stable symptoms with morning dyspnea improving throughout the day. Patient is able to perform activities of daily living with rest periods. No current use of home oxygen except for long walks and shopping. Previous overnight oximetry testing in 2022 showed desaturation to 81%. She spent approx 1 hour with Spo2 <88%, declined nocturnal oxygen use.  -Continue Trelegy once daily, Albuterol or Ipratropium-Albuterol nebulizer every six hours as needed, and Montelukast at bedtime. -I have asked patient trial nocturnal oxygen to potentially improve morning dyspnea. -Order chest X-ray given current smoking status and annual follow-up recommendation.  Smoking Current smoker with less than six cigarettes per day. -Encourage smoking cessation for overall health improvement.  Medication Refills Request for Trelegy and Montelukast refills. -Provide refills for Trelegy and Montelukast.  Follow-up -Schedule follow-up visit in six months.       Glenford Bayley, NP 11/29/2023

## 2023-11-29 NOTE — Patient Instructions (Addendum)
-  CHRONIC OBSTRUCTIVE PULMONARY DISEASE (COPD) AND RESPIRATORY FAILURE: COPD is a chronic lung condition that makes it hard to breathe, and respiratory failure means your lungs are not getting enough oxygen into your blood. Your symptoms are stable, and you can continue your current medications: Trelegy once daily, Albuterol or Ipratropium-Albuterol nebulizer every six hours as needed, and Montelukast at bedtime. We may consider trying nocturnal oxygen to help with your morning breathing difficulties. A chest X-ray has been ordered due to your smoking status and for your annual follow-up.  -SMOKING: Smoking can worsen your lung condition and overall health. It is highly recommended that you quit smoking to improve your health.  -MEDICATION REFILLS: Your prescriptions for Trelegy and Montelukast have been refilled.  Orders: CXR  INSTRUCTIONS:  Please schedule a follow-up visit in six months. Additionally, a chest X-ray has been ordered, so please ensure you complete this as soon as possible.  Follow-up 6 months with Dr. Francine Graven

## 2023-12-10 NOTE — Progress Notes (Signed)
 Please let patient know CXR showed emphysema, without evidence of acute cardiopulmonary disease. No changes recommended

## 2023-12-13 ENCOUNTER — Telehealth: Payer: Self-pay

## 2023-12-13 NOTE — Telephone Encounter (Signed)
-----   Message from Christine Keller sent at 12/10/2023  1:55 PM EST ----- Please let patient know CXR showed emphysema, without evidence of acute cardiopulmonary disease. No changes recommended

## 2023-12-13 NOTE — Telephone Encounter (Signed)
 I called and spoke to pt. Pt informed of cxr results per beth. Verbalized understanding. Nfn

## 2023-12-13 NOTE — Progress Notes (Signed)
 I called and spoke to pt. Pt informed of cxr results per beth. Verbalized understanding. Nfn

## 2023-12-24 DIAGNOSIS — J449 Chronic obstructive pulmonary disease, unspecified: Secondary | ICD-10-CM | POA: Diagnosis not present

## 2024-01-24 DIAGNOSIS — J449 Chronic obstructive pulmonary disease, unspecified: Secondary | ICD-10-CM | POA: Diagnosis not present

## 2024-02-16 ENCOUNTER — Other Ambulatory Visit: Payer: Self-pay | Admitting: Family Medicine

## 2024-02-16 DIAGNOSIS — I10 Essential (primary) hypertension: Secondary | ICD-10-CM

## 2024-02-16 DIAGNOSIS — Z760 Encounter for issue of repeat prescription: Secondary | ICD-10-CM

## 2024-02-23 DIAGNOSIS — J449 Chronic obstructive pulmonary disease, unspecified: Secondary | ICD-10-CM | POA: Diagnosis not present

## 2024-03-25 DIAGNOSIS — J449 Chronic obstructive pulmonary disease, unspecified: Secondary | ICD-10-CM | POA: Diagnosis not present

## 2024-03-29 ENCOUNTER — Encounter: Payer: Self-pay | Admitting: Family Medicine

## 2024-04-21 ENCOUNTER — Encounter: Payer: Self-pay | Admitting: Advanced Practice Midwife

## 2024-04-24 DIAGNOSIS — J449 Chronic obstructive pulmonary disease, unspecified: Secondary | ICD-10-CM | POA: Diagnosis not present

## 2024-05-04 NOTE — Patient Instructions (Addendum)
 It was great to see you today I recommend that you get the second dose of Shingrix vaccine if not already, and 1 dose of RSV Try the Myrbetriq  for your bladder- please let me know how this  works for you  Assuming all is well please see me in about 6 months

## 2024-05-04 NOTE — Progress Notes (Signed)
 [facility] [address], [address] [place], [place] [address] [phone]  Date:  05/08/2024   Name:  Christine Keller   DOB:  August 14, 1941   MRN:  [id number]  PCP:  Watt Harlene BROCKS, MD    Chief Complaint: Medical Management of Chronic Issues   History of Present Illness:  Christine Keller is a 83 y.o. very pleasant female patient who presents with the following:  Patient seen today for periodic follow-up-most recent visit with myself was in February at which time her main concern was urinary incontinence.  We had her try some Detrol  LA  History of hypertension, COPD, hypothyroidism, GERD, ovarian tumor, osteopenia/osteoporosis, tobacco use, prediabetes.  She is using her oxygen  2L- she will use it when she goes out and is walking more - she really is not using oxygen  however.  She was also seen by pulmonology in February: She is followed by Dr. Kara for history of COPD and chronic respiratory failure. Pulmonary function testing in November 2019 showed moderate obstructive lung disease.  Maintained on Trelegy Ellipta  100 mcg, as needed albuterol  HFA/DuoNeb q 6 hours and montelukast  10 mg at bedtime. ///////// 1. Chronic obstructive pulmonary disease, unspecified COPD type (HCC) (Primary) - DG Chest 2 View; Future - Fluticasone -Umeclidin-Vilant (TRELEGY ELLIPTA ) 100-62.5-25 MCG/ACT AEPB; Take 1 puff by mouth daily.  - montelukast  (SINGULAIR ) 10 MG tablet; Take 1 tablet (10 mg total) by mouth at bedtime.  2. Chronic respiratory failure with hypoxia (HCC) Chronic Obstructive Pulmonary Disease (COPD) and Respiratory Failure Stable symptoms with morning dyspnea improving throughout the day. Patient is able to perform activities of daily living with rest periods. No current use of home oxygen  except for long walks and shopping. Previous overnight oximetry testing in 2022 showed desaturation to 81%. She spent approx 1 hour with Spo2 <88%, declined  nocturnal oxygen  use.  -Continue Trelegy once daily, Albuterol  or Ipratropium-Albuterol  nebulizer every six hours as needed, and Montelukast  at bedtime. -I have asked patient trial nocturnal oxygen  to potentially improve morning dyspnea. -Order chest X-ray given current smoking status and annual follow-up recommendation. Smoking Current smoker with less than six cigarettes per day. -Encourage smoking cessation for overall health improvement.    Encouraged her to give a dose of RSV, second dose of Shingrix Has declined mammo, dexa, lung cancer screening CT, colon cancer screening She will see pulmonology in September   Patient Active Problem List   Diagnosis Date Noted   Routine general medical examination at a health care facility 08/14/2015   Hypothyroidism 12/05/2014   Hyperlipidemia 08/08/2014   Osteopenia 12/27/2013   GERD (gastroesophageal reflux disease) 12/27/2013   Guaiac positive stools 12/27/2013   Ovarian tumor 05/16/2012   Prediabetes 04/16/2011   Preventative health care 04/15/2011   Hyperglycemia 04/15/2011   Essential hypertension 12/22/2010   Carpal tunnel syndrome 12/22/2010   COPD (chronic obstructive pulmonary disease) (HCC) 11/15/2009   POLYCYTHEMIA 10/30/2009   TOBACCO ABUSE 10/30/2009   DYSPNEA 10/30/2009    Past Medical History:  Diagnosis Date   Bilateral carpal tunnel syndrome    COPD (chronic obstructive pulmonary disease) (HCC) 1.31.11   PFT FEV1 1.19(56%), FVC 2.68(90%), FEV1% 44, TLC 5.80(115%), DLCO 52%   Hyperlipidemia    Hypertension    Osteoporosis    OSTEOPENIA   Ovarian tumor 2014   Testosterone secreting ovarain tumor   Thyroid  nodule    Tobacco abuse     Past Surgical History:  Procedure Laterality Date  CARPAL TUNNEL RELEASE  05/26/2012   Procedure: CARPAL TUNNEL RELEASE;  Surgeon: Lamar LULLA Leonor Mickey., MD;  Location: [facility];  Service: Orthopedics;  Laterality: Left;  Injection of Right thumb Carpal-Metacarpal  joint also   CARPAL TUNNEL RELEASE  08/16/2012   Procedure: CARPAL TUNNEL RELEASE;  Surgeon: Lamar LULLA Leonor Mickey., MD;  Location: [facility];  Service: Orthopedics;  Laterality: Right;  Inject Left Thumb Carpal Metacarpal Joint   OTHER SURGICAL HISTORY  12/14/12   bilateral oopherectomy [facility]   STERIOD INJECTION  08/16/2012   Procedure: STEROID INJECTION;  Surgeon: Lamar LULLA Leonor Mickey., MD;  Location: [facility];  Service: Orthopedics;  Laterality: Left;  Inject Left Thumb Carpal Metacarpal Joint   TONSILLECTOMY     ULNAR NERVE TRANSPOSITION  05/26/2012   Procedure: ULNAR NERVE DECOMPRESSION/TRANSPOSITION;  Surgeon: Lamar LULLA Leonor Mickey., MD;  Location: [facility];  Service: Orthopedics;  Laterality: Left;    Social History   Tobacco Use   Smoking status: Every Day    Current packs/day: 1.00    Average packs/day: 1 pack/day for 60.0 years (60.0 ttl pk-yrs)    Types: Cigarettes   Smokeless tobacco: Never   Tobacco comments:    Less than 1 ppd.  Vaping Use   Vaping status: Never Used  Substance Use Topics   Alcohol use: Yes    Alcohol/week: 7.0 standard drinks of alcohol    Types: 7 Glasses of wine per week   Drug use: No    Family History  Problem Relation Age of Onset   Heart disease Mother    Hypertension Mother    Asthma Father    Emphysema Father    Heart disease Brother     Allergies  Allergen Reactions   Codeine     REACTION: hallucinations   Niacin     REACTION: rash    Medication list has been reviewed and updated.  Current Outpatient Medications on File Prior to Visit  Medication Sig Dispense Refill   albuterol  (VENTOLIN  HFA) 108 (90 Base) MCG/ACT inhaler Inhale 2 puffs into the lungs every 6 (six) hours as needed for wheezing or shortness of breath. 8 g 6   CALCIUM-MAGNESIUM-ZINC PO Take by mouth daily.     Cholecalciferol (VITAMIN D3) 2000 UNITS capsule Take 2,000 Units by mouth daily.     Coenzyme Q10 (COQ10) 200  MG CAPS Take 1 capsule by mouth daily.     famotidine (PEPCID) 20 MG tablet Take 20 mg by mouth at bedtime.     Fluticasone -Umeclidin-Vilant (TRELEGY ELLIPTA ) 100-62.5-25 MCG/ACT AEPB Take 1 puff by mouth daily. 60 each 5   ipratropium-albuterol  (DUONEB) 0.5-2.5 (3) MG/3ML SOLN Take 3 mLs by nebulization every 6 (six) hours as needed. 360 mL 3   levothyroxine  (SYNTHROID ) 50 MCG tablet Take 1 tablet (50 mcg total) by mouth daily before breakfast. 90 tablet 3   montelukast  (SINGULAIR ) 10 MG tablet Take 1 tablet (10 mg total) by mouth at bedtime. 90 tablet 3   OMEGA 3 1000 MG CAPS Take 1 capsule by mouth 2 (two) times daily.     Probiotic Product (PROBIOTIC DAILY PO) Take by mouth daily.     simvastatin  (ZOCOR ) 10 MG tablet Take 1 tablet (10 mg total) by mouth daily. 90 tablet 3   valsartan -hydrochlorothiazide  (DIOVAN -HCT) 160-25 MG tablet Take 1 tablet by mouth daily. 90 tablet 0   No current facility-administered medications on file prior to visit.    Review of  Systems:  As per HPI- otherwise negative.   Physical Examination: Vitals:   05/08/24 1335 05/08/24 1354  BP: (!) 142/82 (!) 140/70  Pulse: 85   SpO2: 92%    Vitals:   05/08/24 1335  Weight: 111 lb (50.3 kg)  Height: 5' 5 (1.651 m)   Body mass index is 18.47 kg/m. Ideal Body Weight: Weight in (lb) to have BMI = 25: 149.9  GEN: no acute distress. Normal weight, looks well  HEENT: Atraumatic, Normocephalic.  Ears and Nose: No external deformity. CV: RRR, No M/G/R. No JVD. No thrill. No extra heart sounds. PULM: CTA B, no wheezes, crackles, rhonchi. No retractions. No resp. distress. No accessory muscle use. ABD: S, NT, ND, +BS. No rebound. No HSM. EXTR: No c/c/e PSYCH: Normally interactive. Conversant.   She did not end up trying detrol  as she was concerned about the SE  Assessment and Plan: Essential hypertension - Plan: Basic metabolic panel with GFR, CBC  Mixed hyperlipidemia  Hypothyroidism, unspecified type  - Plan: TSH  Urge incontinence - Plan: mirabegron  ER (MYRBETRIQ ) 50 MG TB24 tablet  BP under ok control- will maintain current medication for now. Pt notes her home BP is generally 130s/ 80s Check TSH today - on levothyroxine  50 Will have her try myrbetriq  assuming her insurance will cover it- she will let me know Recheck in 6 months   Signed Harlene Schroeder, MD  Received labs 8/5- message to pt  Results for orders placed or performed in visit on 05/08/24  Basic metabolic panel with GFR   Collection Time: 05/08/24  1:55 PM  Result Value Ref Range   Sodium 142 135 - 145 mEq/L   Potassium 4.3 3.5 - 5.1 mEq/L   Chloride 102 96 - 112 mEq/L   CO2 31 19 - 32 mEq/L   Glucose, Bld 90 70 - 99 mg/dL   BUN 15 6 - 23 mg/dL   Creatinine, Ser 9.21 0.40 - 1.20 mg/dL   GFR 30.06 >39.99 mL/min   Calcium 10.2 8.4 - 10.5 mg/dL  CBC   Collection Time: 05/08/24  1:55 PM  Result Value Ref Range   WBC 8.2 4.0 - 10.5 K/uL   RBC 4.13 3.87 - 5.11 Mil/uL   Platelets 261.0 150.0 - 400.0 K/uL   Hemoglobin 13.7 12.0 - 15.0 g/dL   HCT 59.2 63.9 - 53.9 %   MCV 98.6 78.0 - 100.0 fl   MCHC 33.7 30.0 - 36.0 g/dL   RDW 86.8 88.4 - 84.4 %  TSH   Collection Time: 05/08/24  1:55 PM  Result Value Ref Range   TSH 0.62 0.35 - 5.50 uIU/mL

## 2024-05-08 ENCOUNTER — Ambulatory Visit (INDEPENDENT_AMBULATORY_CARE_PROVIDER_SITE_OTHER): Payer: Medicare HMO | Admitting: Family Medicine

## 2024-05-08 VITALS — BP 140/70 | HR 85 | Ht 65.0 in | Wt 111.0 lb

## 2024-05-08 DIAGNOSIS — E039 Hypothyroidism, unspecified: Secondary | ICD-10-CM | POA: Diagnosis not present

## 2024-05-08 DIAGNOSIS — I10 Essential (primary) hypertension: Secondary | ICD-10-CM

## 2024-05-08 DIAGNOSIS — E782 Mixed hyperlipidemia: Secondary | ICD-10-CM | POA: Diagnosis not present

## 2024-05-08 DIAGNOSIS — N3941 Urge incontinence: Secondary | ICD-10-CM

## 2024-05-08 MED ORDER — MIRABEGRON ER 50 MG PO TB24: 50.0000 mg | ORAL_TABLET | Freq: Every day | ORAL | 5 refills | Status: AC

## 2024-05-09 ENCOUNTER — Encounter: Payer: Self-pay | Admitting: Family Medicine

## 2024-05-09 LAB — BASIC METABOLIC PANEL WITH GFR
BUN: 15 mg/dL (ref 6–23)
CO2: 31 meq/L (ref 19–32)
Calcium: 10.2 mg/dL (ref 8.4–10.5)
Chloride: 102 meq/L (ref 96–112)
Creatinine, Ser: 0.78 mg/dL (ref 0.40–1.20)
GFR: 69.93 mL/min (ref >60.00)
Glucose, Bld: 90 mg/dL (ref 70–99)
Potassium: 4.3 meq/L (ref 3.5–5.1)
Sodium: 142 meq/L (ref 135–145)

## 2024-05-09 LAB — CBC
HCT: 40.7 % (ref 36.0–46.0)
Hemoglobin: 13.7 g/dL (ref 12.0–15.0)
MCHC: 33.7 g/dL (ref 30.0–36.0)
MCV: 98.6 fl (ref 78.0–100.0)
Platelets: 261 K/uL (ref 150.0–400.0)
RBC: 4.13 Mil/uL (ref 3.87–5.11)
RDW: 13.1 % (ref 11.5–15.5)
WBC: 8.2 K/uL (ref 4.0–10.5)

## 2024-05-09 LAB — TSH: TSH: 0.62 u[IU]/mL (ref 0.35–5.50)

## 2024-05-14 ENCOUNTER — Other Ambulatory Visit: Payer: Self-pay | Admitting: Family Medicine

## 2024-05-14 DIAGNOSIS — Z760 Encounter for issue of repeat prescription: Secondary | ICD-10-CM

## 2024-05-14 DIAGNOSIS — I10 Essential (primary) hypertension: Secondary | ICD-10-CM

## 2024-05-15 ENCOUNTER — Ambulatory Visit: Payer: Self-pay

## 2024-05-15 NOTE — Telephone Encounter (Signed)
 FYI Only or Action Required?: FYI only for provider.  Patient was last seen in primary care on 05/08/2024 by Copland, Harlene BROCKS, MD.  Called Nurse Triage reporting Jaw Pain.  Symptoms began yesterday.  Interventions attempted: Nothing.  Symptoms are: unchanged.  Triage Disposition: See Physician Within 24 Hours  Patient/caregiver understands and will follow disposition?: Yes  Pt states pain started yesterday and didn't bother her at all last night just while she has been awake, states some times it hurts when she swallows and some times it does. States right eye watering and some nasal drainage as well both clear.   Copied from CRM [id number]. Topic: Clinical - Red Word Triage >> May 15, 2024  3:55 PM Frederich PARAS wrote: [facility] that prompted transfer to Nurse Triage: pain  PT Calling she is having pain on side her jaw, have been i n pain couple of days, mean and she gets aggressive ,the pain goes away for about a hour then come back . She doesn't know if its her ear or her throat or her teeth Reason for Disposition  [1] MODERATE pain (e.g., interferes with normal activities) AND [2] constant AND [3] present > 24 hours  Answer Assessment - Initial Assessment Questions 1. ONSET: When did the pain start? (e.g., minutes, hours, days)     Sunday 2. ONSET: Does the pain come and go, or has it been constant since it started? (e.g., constant, intermittent, fleeting)     Comes and goes 3. SEVERITY: How bad is the pain? (Scale 1-10; mild, moderate or severe)     No pain right now, tolerable, sometimes hurts when she swallow and sometimes it doesn't.  4. LOCATION: Where does it hurt?      Right jaw 5. RASH: Is there any redness, rash, or swelling of your face?     no 6. FEVER: Do you have a fever? If Yes, ask: What is it, how was it measured, and when did it start?      no 7. OTHER SYMPTOMS: Do you have any other symptoms? (e.g., fever, toothache, nasal discharge, nasal  congestion, clicking sensation in jaw joint)   Runny nose but that's her normal, right side of neck pain but this is chronic, right eye tearing  Protocols used: Face Pain-A-AH

## 2024-05-16 ENCOUNTER — Encounter: Payer: Self-pay | Admitting: Internal Medicine

## 2024-05-16 ENCOUNTER — Ambulatory Visit (INDEPENDENT_AMBULATORY_CARE_PROVIDER_SITE_OTHER): Admitting: Internal Medicine

## 2024-05-16 VITALS — BP 138/68 | HR 79 | Temp 98.1°F | Resp 16 | Ht 65.0 in | Wt 111.0 lb

## 2024-05-16 DIAGNOSIS — R519 Headache, unspecified: Secondary | ICD-10-CM

## 2024-05-16 MED ORDER — GABAPENTIN 100 MG PO CAPS: 100.0000 mg | ORAL_CAPSULE | Freq: Three times a day (TID) | ORAL | 3 refills | Status: AC

## 2024-05-16 NOTE — Progress Notes (Signed)
 Subjective:    Patient ID: Christine Keller, female    DOB: 1941-01-04, 83 y.o.   MRN: [id number]  DOS:  05/16/2024 Type of visit - description: Acute, here with her husband  Symptoms started 2 days ago: On and off pain located at the right side of the face and jaw. Last 2 to 3 minutes, several episodes a day, associated with mild right eye tearing.  Denies fever or chills. No URI but has chronic runny nose. Symptoms do not increase with chewing. Hearing and vision are okay. No rash No unusual dental pain.  Had a RSV injection in the left deltoid area a week ago, temporarily developed some itching at the area but that seems better  Review of Systems See above   Past Medical History:  Diagnosis Date   Bilateral carpal tunnel syndrome    COPD (chronic obstructive pulmonary disease) (HCC) 1.31.11   PFT FEV1 1.19(56%), FVC 2.68(90%), FEV1% 44, TLC 5.80(115%), DLCO 52%   Hyperlipidemia    Hypertension    Osteoporosis    OSTEOPENIA   Ovarian tumor 2014   Testosterone secreting ovarain tumor   Thyroid  nodule    Tobacco abuse     Past Surgical History:  Procedure Laterality Date   CARPAL TUNNEL RELEASE  05/26/2012   Procedure: CARPAL TUNNEL RELEASE;  Surgeon: Lamar LULLA Leonor Mickey., MD;  Location: [facility];  Service: Orthopedics;  Laterality: Left;  Injection of Right thumb Carpal-Metacarpal joint also   CARPAL TUNNEL RELEASE  08/16/2012   Procedure: CARPAL TUNNEL RELEASE;  Surgeon: Lamar LULLA Leonor Mickey., MD;  Location: [facility];  Service: Orthopedics;  Laterality: Right;  Inject Left Thumb Carpal Metacarpal Joint   OTHER SURGICAL HISTORY  12/14/12   bilateral oopherectomy [facility]   STERIOD INJECTION  08/16/2012   Procedure: STEROID INJECTION;  Surgeon: Lamar LULLA Leonor Mickey., MD;  Location: [facility];  Service: Orthopedics;  Laterality: Left;  Inject Left Thumb Carpal Metacarpal Joint   TONSILLECTOMY     ULNAR NERVE TRANSPOSITION  05/26/2012    Procedure: ULNAR NERVE DECOMPRESSION/TRANSPOSITION;  Surgeon: Lamar LULLA Leonor Mickey., MD;  Location: [facility];  Service: Orthopedics;  Laterality: Left;    Current Outpatient Medications  Medication Instructions   albuterol  (VENTOLIN  HFA) 108 (90 Base) MCG/ACT inhaler 2 puffs, Inhalation, Every 6 hours PRN   CALCIUM-MAGNESIUM-ZINC PO Daily   Coenzyme Q10 (COQ10) 200 MG CAPS 1 capsule, Daily   famotidine (PEPCID) 20 mg, Daily at bedtime   Fluticasone -Umeclidin-Vilant (TRELEGY ELLIPTA ) 100-62.5-25 MCG/ACT AEPB 1 puff, Oral, Daily   ipratropium-albuterol  (DUONEB) 0.5-2.5 (3) MG/3ML SOLN 3 mLs, Nebulization, Every 6 hours PRN   levothyroxine  (SYNTHROID ) 50 mcg, Oral, Daily before breakfast   mirabegron  ER (MYRBETRIQ ) 50 mg, Oral, Daily   montelukast  (SINGULAIR ) 10 mg, Oral, Daily at bedtime   OMEGA 3 1000 MG CAPS 1 capsule, 2 times daily   Probiotic Product (PROBIOTIC DAILY PO) Daily   simvastatin  (ZOCOR ) 10 mg, Oral, Daily   valsartan -hydrochlorothiazide  (DIOVAN -HCT) 160-25 MG tablet 1 tablet, Oral, Daily   Vitamin D3 2,000 Units, Daily       Objective:   Physical Exam HENT:     Head:     BP (!) 140/80   Pulse 79   Temp 98.1 F (36.7 C) (Oral)   Resp 16   Ht 5' 5 (1.651 m)   Wt 111 lb (50.3 kg)   SpO2 90%   BMI 18.47 kg/m  General:   Well developed,  NAD, BMI noted. HEENT:  Normocephalic . Face symmetric, atraumatic TMs, ear canals: Normal TMJ: Minimal tenderness bilaterally, no click. Temporal artery arteries palpated.  Nontender Neck: No thyromegaly, no lymphadenopathies. Eyes: Symptoms of previous cataract surgery, no redness or discharge.  No tearing   Skin: No rash anywhere in the face neck or upper back Neurologic:  alert & oriented X3.  Speech normal, gait appropriate for age and unassisted.  EOMI.  Face symmetric. Psych--  Cognition and judgment appear intact.  Cooperative with normal attention span and concentration.  Behavior  appropriate. No anxious or depressed appearing.      Assessment   83 year old female, PMH includes hypertension, high cholesterol, COPD, among other conditions presents with  Facial pain: Started 2 days ago, associated with right eye tearing, no fever or chills, no rash. Migraine versus trigeminal neuralgia versus others. Recommend a trial with gabapentin .  Call if not gradually better.  Call if rash.

## 2024-05-16 NOTE — Patient Instructions (Signed)
 Take gabapentin  100 mg: 1 tablet 3 times a day for the next 2 weeks.  You can continue if you  feel it is helping.  Watch for drowsiness, you can decrease it to 1 tablet twice daily if needed   If the pain is not gradually getting better let us  know  If the pain is severe, you have fever, chills, rash, a major headache: Call immediately.

## 2024-05-25 DIAGNOSIS — J449 Chronic obstructive pulmonary disease, unspecified: Secondary | ICD-10-CM | POA: Diagnosis not present

## 2024-06-07 ENCOUNTER — Ambulatory Visit: Admitting: Pulmonary Disease

## 2024-06-07 ENCOUNTER — Encounter: Payer: Self-pay | Admitting: Pulmonary Disease

## 2024-06-07 ENCOUNTER — Telehealth: Payer: Self-pay

## 2024-06-07 VITALS — HR 79 | Ht 65.0 in | Wt 112.0 lb

## 2024-06-07 DIAGNOSIS — J449 Chronic obstructive pulmonary disease, unspecified: Secondary | ICD-10-CM

## 2024-06-07 DIAGNOSIS — J9611 Chronic respiratory failure with hypoxia: Secondary | ICD-10-CM | POA: Diagnosis not present

## 2024-06-07 MED ORDER — PREDNISONE 10 MG PO TABS: 20.0000 mg | ORAL_TABLET | Freq: Every day | ORAL | 0 refills | Status: AC

## 2024-06-07 MED ORDER — AZITHROMYCIN 250 MG PO TABS: ORAL_TABLET | ORAL | 0 refills | Status: AC

## 2024-06-07 NOTE — Patient Instructions (Signed)
 Continue duoneb nebulizer treatment in the morning before trelegy  Continue trelegy ellipta  1 puff daily  Start prednisone  20mg  daily for 7 days  Start Zpak antibiotic for  5 days  Start Ohtuvayre  nebulizer treatment twice daily  Follow up in 6 months

## 2024-06-07 NOTE — Telephone Encounter (Signed)
 Ohtuvayre  paper handed into pharmacy

## 2024-06-07 NOTE — Progress Notes (Unsigned)
 Synopsis: Referred in December 2022 for COPD by Harlene Schroeder, MD  Subjective:   PATIENT ID: Christine Keller GENDER: female DOB: November 21, 1940, MRN: [id number]  HPI  Chief Complaint  Patient presents with   Medical Management of Chronic Issues    Pt states been well    Christine Keller is an 83 year old woman, daily smoker with GERD and hypertension who returns to pulmonary clinic for follow up of COPD and chronic respiratory failure.   Patient last seen 11/29/23 by Almarie Ferrari, NP.  She experiences significant shortness of breath that limits her daily activities, requiring frequent rest. Her maintenance medications include Trelegy and nebulizer treatments. She continues to smoke daily. Discussed ohtuvayre  nebulizer treatments and she is interested in trying this therapy.  OV 11/25/22 She continues to have significant shortness of breath and limitations in her daily activities. She is using nebulizer treatment in the morning prior to taking her trelegy. She has increased cough with mucous production over the past couple of weeks. She is still reluctant to using her oxygen  on a regular basis.  She continues to smoke daily.  OV 08/19/2022 She has been stable since last visit. She does not feel trelegy is doing anything for her breathing. We discussed how she may not feel relief of her dyspnea but there are advantages of preventing flares in her breathing that could require hospitalization. We also discussed transitioning to nebulizer solution treatments and she is amenable to trying this.   She is using oxygen  at night and at time when ambulatory.   OV 12/18/21 ONO showed 1hr below 88%.  She does not wish to start nocturnal oxygen  at this time as she reports she is sleeping fine.  The risks of decreased oxygen  saturations at night when sleeping over long periods of times were explained to her which included cardiac and neurologic effects.  She was started on trelegy ellipta  1 puff  daily at last visit.  She initially noted some improvement but overall does not feel greatly improved compared to Spiriva .  She has significant dyspnea when walking out to her mailbox.  OV 09/09/21 She reports increasing shortness of breath over recent weeks after suffering a upper respiratory viral infection which she contracted from her grandchild.  She is having increased sinus congestion and postnasal drainage.  She has been having progressive shortness of breath over recent years and reports having dyspnea when walking from room to room in her home.  She denies any trouble with wheezing.  Prior to the respiratory viral infection she denied any issues with cough or sputum production.  She is currently using Spiriva  HandiHaler daily.  She is also using DuoNeb nebulizer treatments as needed.  She is a daily smoker smoking about 3 cigarettes/day.  She was previously smoking 1 pack/day and has been smoking for over 60 years.  Spirometry from 2019 shows severe obstructive defect.  Past Medical History:  Diagnosis Date   Bilateral carpal tunnel syndrome    COPD (chronic obstructive pulmonary disease) (HCC) 1.31.11   PFT FEV1 1.19(56%), FVC 2.68(90%), FEV1% 44, TLC 5.80(115%), DLCO 52%   Hyperlipidemia    Hypertension    Osteoporosis    OSTEOPENIA   Ovarian tumor 2014   Testosterone secreting ovarain tumor   Thyroid  nodule    Tobacco abuse      Family History  Problem Relation Age of Onset   Heart disease Mother    Hypertension Mother    Asthma Father    Emphysema Father  Heart disease Brother      Social History   Socioeconomic History   Marital status: Married    Spouse name: Not on file   Number of children: Not on file   Years of education: Not on file   Highest education level: Not on file  Occupational History   Occupation: Retired    Associate Professor: RETIRED    Comment: Diplomatic Services operational officer  Tobacco Use   Smoking status: Every Day    Current packs/day: 1.00    Average  packs/day: 1 pack/day for 60.0 years (60.0 ttl pk-yrs)    Types: Cigarettes   Smokeless tobacco: Never   Tobacco comments:    Less than 1 ppd.  Vaping Use   Vaping status: Never Used  Substance and Sexual Activity   Alcohol use: Yes    Alcohol/week: 7.0 standard drinks of alcohol    Types: 7 Glasses of wine per week   Drug use: No   Sexual activity: Not on file  Other Topics Concern   Not on file  Social History Narrative   Regular exercise:  3 x weekly   Caffeine Use:  occasional   Social Drivers of Corporate investment banker Strain: Not on file  Food Insecurity: Not on file  Transportation Needs: Not on file  Physical Activity: Not on file  Stress: Not on file  Social Connections: Not on file  Intimate Partner Violence: Not on file     Allergies  Allergen Reactions   Codeine     REACTION: hallucinations   Niacin     REACTION: rash     Outpatient Medications Prior to Visit  Medication Sig Dispense Refill   albuterol  (VENTOLIN  HFA) 108 (90 Base) MCG/ACT inhaler Inhale 2 puffs into the lungs every 6 (six) hours as needed for wheezing or shortness of breath. 8 g 6   CALCIUM-MAGNESIUM-ZINC PO Take by mouth daily.     Cholecalciferol (VITAMIN D3) 2000 UNITS capsule Take 2,000 Units by mouth daily.     Coenzyme Q10 (COQ10) 200 MG CAPS Take 1 capsule by mouth daily.     famotidine (PEPCID) 20 MG tablet Take 20 mg by mouth at bedtime.     Fluticasone -Umeclidin-Vilant (TRELEGY ELLIPTA ) 100-62.5-25 MCG/ACT AEPB Take 1 puff by mouth daily. 60 each 5   gabapentin  (NEURONTIN ) 100 MG capsule Take 1 capsule (100 mg total) by mouth 3 (three) times daily. 30 capsule 3   ipratropium-albuterol  (DUONEB) 0.5-2.5 (3) MG/3ML SOLN Take 3 mLs by nebulization every 6 (six) hours as needed. 360 mL 3   levothyroxine  (SYNTHROID ) 50 MCG tablet Take 1 tablet (50 mcg total) by mouth daily before breakfast. 90 tablet 3   mirabegron  ER (MYRBETRIQ ) 50 MG TB24 tablet Take 1 tablet (50 mg total) by  mouth daily. 30 tablet 5   montelukast  (SINGULAIR ) 10 MG tablet Take 1 tablet (10 mg total) by mouth at bedtime. 90 tablet 3   OMEGA 3 1000 MG CAPS Take 1 capsule by mouth 2 (two) times daily.     Probiotic Product (PROBIOTIC DAILY PO) Take by mouth daily.     simvastatin  (ZOCOR ) 10 MG tablet Take 1 tablet (10 mg total) by mouth daily. 90 tablet 3   valsartan -hydrochlorothiazide  (DIOVAN -HCT) 160-25 MG tablet TAKE 1 TABLET BY MOUTH DAILY 90 tablet 0   No facility-administered medications prior to visit.   Review of Systems  Constitutional:  Negative for chills, fever, malaise/fatigue and weight loss.  HENT:  Negative for congestion, sinus pain and sore throat.  Eyes: Negative.   Respiratory:  Positive for cough, sputum production and shortness of breath. Negative for hemoptysis and wheezing.   Cardiovascular:  Negative for chest pain, palpitations, orthopnea, claudication and leg swelling.  Gastrointestinal:  Negative for abdominal pain, heartburn, nausea and vomiting.  Genitourinary: Negative.   Musculoskeletal:  Negative for joint pain and myalgias.  Skin:  Negative for rash.  Neurological:  Negative for weakness.  Endo/Heme/Allergies: Negative.   Psychiatric/Behavioral: Negative.      Objective:   Vitals:   06/07/24 1312  Pulse: 79  SpO2: 96%  Weight: 112 lb (50.8 kg)  Height: 5' 5 (1.651 m)  PF: (!) 2 L/min     Physical Exam Constitutional:      General: She is not in acute distress.    Appearance: She is not ill-appearing.  HENT:     Head: Normocephalic and atraumatic.  Eyes:     General: No scleral icterus.    Conjunctiva/sclera: Conjunctivae normal.  Cardiovascular:     Rate and Rhythm: Normal rate and regular rhythm.     Pulses: Normal pulses.     Heart sounds: Normal heart sounds. No murmur heard. Pulmonary:     Effort: Pulmonary effort is normal.     Breath sounds: Decreased air movement present. Decreased breath sounds and wheezing present. No rhonchi or  rales.  Musculoskeletal:     Right lower leg: No edema.     Left lower leg: No edema.  Skin:    General: Skin is warm and dry.  Neurological:     Mental Status: She is alert.    CBC    Component Value Date/Time   WBC 8.2 05/08/2024 1355   RBC 4.13 05/08/2024 1355   HGB 13.7 05/08/2024 1355   HCT 40.7 05/08/2024 1355   PLT 261.0 05/08/2024 1355   MCV 98.6 05/08/2024 1355   MCH 34.1 (H) 07/24/2020 1107   MCHC 33.7 05/08/2024 1355   RDW 13.1 05/08/2024 1355   LYMPHSABS 2.9 07/19/2015 0952   MONOABS 0.7 07/19/2015 0952   EOSABS 0.7 07/19/2015 0952   BASOSABS 0.0 07/19/2015 0952      Latest Ref Rng & Units 05/08/2024    1:55 PM 11/08/2023    2:28 PM 02/17/2023    1:39 PM  BMP  Glucose 70 - 99 mg/dL 90  89  94   BUN 6 - 23 mg/dL 15  14  15    Creatinine 0.40 - 1.20 mg/dL 9.21  9.10  9.21   Sodium 135 - 145 mEq/L 142  143  141   Potassium 3.5 - 5.1 mEq/L 4.3  4.1  4.2   Chloride 96 - 112 mEq/L 102  101  101   CO2 19 - 32 mEq/L 31  32  32   Calcium 8.4 - 10.5 mg/dL 89.7  89.7  89.6    Chest imaging: CXR 08/18/18 Normal cardiac silhouette. Aortic atherosclerosis with calcification. Hyperinflated lungs and flattened diaphragms compatible with COPD. No focal consolidation. No pleural effusion or pneumothorax. No acute osseous abnormality is evident.  PFT:     No data to display         Spirometry 2019 FEV1/FVC 56% FVC 1.8L (62%) FEV1 1.0L (47%)  Labs:  Path:  Echo:  Heart Catheterization:  Assessment & Plan:   Chronic obstructive pulmonary disease, unspecified COPD type (HCC) - Plan: predniSONE  (DELTASONE ) 10 MG tablet, azithromycin  (ZITHROMAX ) 250 MG tablet  Chronic respiratory failure with hypoxia (HCC)  Discussion: Christine Keller is an 83 year  old woman, daily smoker with GERD and hypertension who returns to pulmonary clinic for follow up of COPD and chronic hypoxemic respiratory failure.   Patient has severe obstructive defect based on spirometry from  2019. She is using 2L of supplemental oxygen  at night and with ambulation as needed.    She is to continue duoneb treatments as needed and daily prior to using her trelegy ellipta  1 puff daily. She is to start ohtuvayre  nebulizer treatments twice daily  Start prednisone  20mg  daily for 7 days, does not tolerate higher doses, and Zpak for COPD exacerbation.   Follow-up in 6 months.  Christine Chill, MD [facility] Office: [phone]    Current Outpatient Medications:    albuterol  (VENTOLIN  HFA) 108 (90 Base) MCG/ACT inhaler, Inhale 2 puffs into the lungs every 6 (six) hours as needed for wheezing or shortness of breath., Disp: 8 g, Rfl: 6   azithromycin  (ZITHROMAX ) 250 MG tablet, Take as directed, Disp: 6 tablet, Rfl: 0   CALCIUM-MAGNESIUM-ZINC PO, Take by mouth daily., Disp: , Rfl:    Cholecalciferol (VITAMIN D3) 2000 UNITS capsule, Take 2,000 Units by mouth daily., Disp: , Rfl:    Coenzyme Q10 (COQ10) 200 MG CAPS, Take 1 capsule by mouth daily., Disp: , Rfl:    famotidine (PEPCID) 20 MG tablet, Take 20 mg by mouth at bedtime., Disp: , Rfl:    Fluticasone -Umeclidin-Vilant (TRELEGY ELLIPTA ) 100-62.5-25 MCG/ACT AEPB, Take 1 puff by mouth daily., Disp: 60 each, Rfl: 5   gabapentin  (NEURONTIN ) 100 MG capsule, Take 1 capsule (100 mg total) by mouth 3 (three) times daily., Disp: 30 capsule, Rfl: 3   ipratropium-albuterol  (DUONEB) 0.5-2.5 (3) MG/3ML SOLN, Take 3 mLs by nebulization every 6 (six) hours as needed., Disp: 360 mL, Rfl: 3   levothyroxine  (SYNTHROID ) 50 MCG tablet, Take 1 tablet (50 mcg total) by mouth daily before breakfast., Disp: 90 tablet, Rfl: 3   mirabegron  ER (MYRBETRIQ ) 50 MG TB24 tablet, Take 1 tablet (50 mg total) by mouth daily., Disp: 30 tablet, Rfl: 5   montelukast  (SINGULAIR ) 10 MG tablet, Take 1 tablet (10 mg total) by mouth at bedtime., Disp: 90 tablet, Rfl: 3   OMEGA 3 1000 MG CAPS, Take 1 capsule by mouth 2 (two) times daily., Disp: , Rfl:     predniSONE  (DELTASONE ) 10 MG tablet, Take 2 tablets (20 mg total) by mouth daily with breakfast., Disp: 14 tablet, Rfl: 0   Probiotic Product (PROBIOTIC DAILY PO), Take by mouth daily., Disp: , Rfl:    simvastatin  (ZOCOR ) 10 MG tablet, Take 1 tablet (10 mg total) by mouth daily., Disp: 90 tablet, Rfl: 3   valsartan -hydrochlorothiazide  (DIOVAN -HCT) 160-25 MG tablet, TAKE 1 TABLET BY MOUTH DAILY, Disp: 90 tablet, Rfl: 0

## 2024-06-09 ENCOUNTER — Telehealth: Payer: Self-pay

## 2024-06-09 ENCOUNTER — Encounter: Payer: Self-pay | Admitting: Pulmonary Disease

## 2024-06-09 NOTE — Telephone Encounter (Signed)
 Received fax from [employer] confirming receipt of Ohtuvayre  enrollment form  Patient ID: [id number]  [name], PharmD, MPH, BCPS, CPP Clinical Pharmacist [facility])

## 2024-06-09 NOTE — Telephone Encounter (Signed)
 Received Ohtuvayre  new start paperwork. Completed form and faxed with clinicals and insurance card copy to [facility]   Phone#: [phone] Fax#: [phone]

## 2024-06-13 NOTE — Telephone Encounter (Signed)
 Received fax from [employer] with summary of benefits. Referral form for Ohtuvayre  received. Rx will be triaged to [facility].. Once benefits investigation completed, pharmacy will reach out the patient to schedule shipment. If medication is unaffordable, patient will need to express financial hardship to be referred back to [place] [facility] for patient assistance program pre-screening.   Patient ID: [id number] Pharmacy phone: [phone] [place] [facility] Phone#: [phone]

## 2024-06-20 ENCOUNTER — Other Ambulatory Visit: Payer: Self-pay | Admitting: Primary Care

## 2024-06-20 DIAGNOSIS — J449 Chronic obstructive pulmonary disease, unspecified: Secondary | ICD-10-CM

## 2024-06-25 DIAGNOSIS — J449 Chronic obstructive pulmonary disease, unspecified: Secondary | ICD-10-CM | POA: Diagnosis not present

## 2024-07-03 NOTE — Telephone Encounter (Signed)
 Pt's husband called back. He stated he was notified that the rx was over $500. I advised him to reach out to [employer] to get screened for the patient assistance. He verbalized his understanding.

## 2024-07-03 NOTE — Telephone Encounter (Signed)
 Reached out to [facility] to check on status of patient's Ohtuvayre . Was advised by the rep that patient's copay was over $500 and needed to be referred back to [employer].   Called and left VM to notify pt of above.

## 2024-07-26 DIAGNOSIS — Z961 Presence of intraocular lens: Secondary | ICD-10-CM | POA: Diagnosis not present

## 2024-07-26 DIAGNOSIS — H353131 Nonexudative age-related macular degeneration, bilateral, early dry stage: Secondary | ICD-10-CM | POA: Diagnosis not present

## 2024-07-26 DIAGNOSIS — H26493 Other secondary cataract, bilateral: Secondary | ICD-10-CM | POA: Diagnosis not present

## 2024-08-10 ENCOUNTER — Other Ambulatory Visit: Payer: Self-pay | Admitting: Family Medicine

## 2024-08-10 DIAGNOSIS — Z760 Encounter for issue of repeat prescription: Secondary | ICD-10-CM

## 2024-08-10 DIAGNOSIS — I10 Essential (primary) hypertension: Secondary | ICD-10-CM

## 2024-08-25 DIAGNOSIS — J449 Chronic obstructive pulmonary disease, unspecified: Secondary | ICD-10-CM | POA: Diagnosis not present

## 2024-09-11 ENCOUNTER — Other Ambulatory Visit: Payer: Self-pay | Admitting: Family Medicine

## 2024-09-24 DIAGNOSIS — J449 Chronic obstructive pulmonary disease, unspecified: Secondary | ICD-10-CM | POA: Diagnosis not present

## 2024-11-01 ENCOUNTER — Other Ambulatory Visit: Payer: Self-pay | Admitting: Family Medicine

## 2024-11-01 DIAGNOSIS — E034 Atrophy of thyroid (acquired): Secondary | ICD-10-CM

## 2024-11-05 ENCOUNTER — Other Ambulatory Visit: Payer: Self-pay | Admitting: Family Medicine

## 2024-11-05 DIAGNOSIS — I10 Essential (primary) hypertension: Secondary | ICD-10-CM

## 2024-11-05 DIAGNOSIS — Z760 Encounter for issue of repeat prescription: Secondary | ICD-10-CM

## 2024-11-08 ENCOUNTER — Ambulatory Visit: Admitting: Family Medicine

## 2024-11-22 ENCOUNTER — Ambulatory Visit: Admitting: Family Medicine

## 2024-12-21 ENCOUNTER — Ambulatory Visit: Admitting: Pulmonary Disease
